# Patient Record
Sex: Female | Born: 1945
Health system: Southern US, Community
[De-identification: ages and names within clinical notes are randomized; demographics above are authoritative.]

## PROBLEM LIST (undated history)

## (undated) DIAGNOSIS — K2980 Duodenitis without bleeding: Secondary | ICD-10-CM

## (undated) DIAGNOSIS — R079 Chest pain, unspecified: Secondary | ICD-10-CM

## (undated) DIAGNOSIS — I10 Essential (primary) hypertension: Secondary | ICD-10-CM

## (undated) DIAGNOSIS — E785 Hyperlipidemia, unspecified: Secondary | ICD-10-CM

## (undated) DIAGNOSIS — B9681 Helicobacter pylori [H. pylori] as the cause of diseases classified elsewhere: Secondary | ICD-10-CM

## (undated) DIAGNOSIS — Z9889 Other specified postprocedural states: Secondary | ICD-10-CM

## (undated) HISTORY — DX: Helicobacter pylori (H. pylori) as the cause of diseases classified elsewhere: K29.80

## (undated) HISTORY — PX: TUBAL LIGATION: SHX77

## (undated) HISTORY — DX: Other specified postprocedural states: Z98.890

## (undated) HISTORY — DX: Essential (primary) hypertension: I10

## (undated) HISTORY — DX: Chest pain, unspecified: R07.9

## (undated) HISTORY — PX: HERNIA REPAIR: SHX51

## (undated) HISTORY — DX: Helicobacter pylori (H. pylori) as the cause of diseases classified elsewhere: B96.81

## (undated) HISTORY — DX: Hyperlipidemia, unspecified: E78.5

---

## 2001-06-17 ENCOUNTER — Other Ambulatory Visit: Admission: RE | Admit: 2001-06-17 | Discharge: 2001-06-17 | Payer: Self-pay | Admitting: Family Medicine

## 2001-11-12 HISTORY — PX: COLONOSCOPY: SHX174

## 2005-09-29 HISTORY — PX: BREAST BIOPSY: SHX20

## 2005-09-29 HISTORY — PX: ABDOMINAL HYSTERECTOMY: SHX81

## 2005-10-22 ENCOUNTER — Ambulatory Visit: Payer: Self-pay | Admitting: Family Medicine

## 2005-12-11 DIAGNOSIS — Z9889 Other specified postprocedural states: Secondary | ICD-10-CM

## 2005-12-11 HISTORY — DX: Other specified postprocedural states: Z98.890

## 2006-02-02 ENCOUNTER — Ambulatory Visit: Payer: Self-pay | Admitting: Gynecology

## 2006-03-31 ENCOUNTER — Ambulatory Visit: Payer: Self-pay | Admitting: Gynecology

## 2006-03-31 ENCOUNTER — Ambulatory Visit (HOSPITAL_COMMUNITY): Admission: RE | Admit: 2006-03-31 | Discharge: 2006-04-01 | Payer: Self-pay | Admitting: Gynecology

## 2006-03-31 ENCOUNTER — Encounter (INDEPENDENT_AMBULATORY_CARE_PROVIDER_SITE_OTHER): Payer: Self-pay | Admitting: *Deleted

## 2006-04-27 ENCOUNTER — Ambulatory Visit: Payer: Self-pay | Admitting: Gynecology

## 2006-06-08 ENCOUNTER — Ambulatory Visit: Payer: Self-pay | Admitting: General Surgery

## 2006-12-29 ENCOUNTER — Ambulatory Visit: Payer: Self-pay | Admitting: Family Medicine

## 2007-12-06 ENCOUNTER — Encounter: Payer: Self-pay | Admitting: Cardiology

## 2007-12-08 ENCOUNTER — Ambulatory Visit: Payer: Self-pay | Admitting: Family Medicine

## 2007-12-15 ENCOUNTER — Ambulatory Visit: Payer: Self-pay | Admitting: Cardiology

## 2007-12-20 ENCOUNTER — Ambulatory Visit: Payer: Self-pay | Admitting: Cardiology

## 2007-12-29 ENCOUNTER — Ambulatory Visit: Payer: Self-pay | Admitting: Cardiology

## 2008-12-12 ENCOUNTER — Ambulatory Visit: Payer: Self-pay | Admitting: Family Medicine

## 2009-03-22 DIAGNOSIS — R079 Chest pain, unspecified: Secondary | ICD-10-CM

## 2009-12-17 ENCOUNTER — Ambulatory Visit: Payer: Self-pay | Admitting: Family Medicine

## 2010-01-14 ENCOUNTER — Ambulatory Visit: Payer: Self-pay | Admitting: Family Medicine

## 2011-01-16 ENCOUNTER — Ambulatory Visit: Payer: Self-pay | Admitting: Family Medicine

## 2011-02-11 NOTE — Assessment & Plan Note (Signed)
[facility] OFFICE NOTE   NAME:[name] [name] CALABRIA                         MRN:          [id number]  DATE:12/15/2007                            DOB:          Nov 29, 1945    REFERRING PHYSICIAN:  Lorie Phenix, MD.   I was asked to consult on [name] [name] by Dr. Lorie Phenix for chest  discomfort.   HISTORY OF PRESENT ILLNESS:  [name] [name] is a delightful 65 year old  married lady who over the last year has had chest tightness going up  into her neck.  This is usually not associated with exertion but can  happen most any time.  She has had some mild dyspnea on exertion,  however.   She denies any presyncope, syncope, tachy palpitations.  She has had no  GI upper symptomatology.  It does not radiate into her arms.   She seems to be very proactive with her health.  Her cardiac risk  factors include age, sex, family history, hypertension and  hyperlipidemia.   She seems very compliant.  Her husband had a heart attack several years  ago and they are very careful with their diets.   PAST MEDICAL HISTORY:  She is not allergic to any medications.  She does  not smoke.  She does not use any recreational products.  She does not  drink alcohol.  She enjoys walking but I am not sure she is doing 3  hours a week or more.   SURGICAL HISTORY:  She had a hysterectomy in July of 2007, breast biopsy  in March of 2008.   CURRENT MEDS:  Atenolol 25 mg a day, Lipitor 20 mg a day, aspirin 81 mg  a day.   ALLERGIES:  She lists no allergies.   FAMILY HISTORY:  Her brothers had coronary disease.  Her parents both  had heart disease but they were older.   SOCIAL HISTORY:  She is a Scientist, product/process development.  She walks around and keeps a  machine running all day.  She is married and has 5 children.   REVIEW OF SYSTEMS:  She has a history of seasonal allergies but no  history of asthma.  All of the other review of systems other than the  HPI have been carefully reviewed and are negative.   PHYSICAL EXAMINATION:  On exam her blood pressure today was 144/84.  Her  pulse is 60 and regular.  She is 5 feet 7 inches, weighs 190 pounds.  HEENT:  Normocephalic, atraumatic.  PERRLA.  Extraocular movements  intact.  Sclerae are clear.  Face symmetry normal.  Carotid upstrokes  equal bilaterally without obvious bruit.  Thyroid is not enlarged.  Trachea is midline.  Neck is supple.  She has a scar over her lower chin  area.  LUNGS:  Clear to auscultation and percussion.  HEART:  PMI was hard to appreciate.  She has no chest wall tenderness.  She has normal S1, S2 without murmur or gallop.  ABDOMINAL EXAM:  Soft with good bowel sounds.  There is no midline  bruit.  There is no obvious hepatomegaly or organomegaly.  EXTREMITIES:  Reveal no significant edema.  Pulses were present.  NEURO EXAM:  Intact.  SKIN:  Unremarkable.   EKG from the outside was reviewed, which shows normal sinus rhythm with  1 PVC.  She does not have criteria for LVH.  There are no ST-segment  changes.   LABORATORY:  Remarkable for a total cholesterol 216, LDL 138, however,  HDL 62 with a total cholesterol to HDL ratio of less than 4.  Her  fasting blood sugar was 87.   ASSESSMENT/PLAN:  A long talk, greater than 30 minutes, with [name] [name]  today.  We have covered the following subjects:  1. The importance of preventative therapy including controlling her      blood pressure, continuing her statin, and aspirin.  2. The need to rule out any significant obstructive coronary disease      with a stress test Myoview.  This will also give [place] a prescription      for exercise.  3. The importance of walking at least 3 hours a week or some sort of      cardio activity.  We also talked about weight control, the risk of      diabetes and hypertension worsening.   I have made no changes in her medical therapy.  Dr. Elease Hashimoto is doing an  excellent job with this.  We  will set her up for a diagnostic exercise  rest stress Myoview.  I will have her come back to answer any questions  and to prescribe an exercise plan.     Thomas C. Daleen Squibb, MD, [facility]  Electronically Signed    TCW/MedQ  DD: 12/15/2007  DT: 12/15/2007  Job #: [id number]

## 2011-02-11 NOTE — Assessment & Plan Note (Signed)
[facility] OFFICE NOTE   NAME:[name], [name] ANTONOPOULOS                         MRN:          [id number]  DATE:12/29/2007                            DOB:          19-Feb-1946    [name] returns today for follow-up of her chest discomfort.   Her stress Myoview was done on March 23 at [facility].  She held her atenolol which is 25 mg once a day.   She exercised on Bruce protocol for only 4 minutes in 16 seconds.  MET  level achieved was 6.1.  Her heart rate jumped up to 176 which is 111%  of predicted maximum heart rate.  Her blood pressure increased  traumatically to 231/113.  She some frequent PVCs and occasional  couplet.  There was no chest pain.  Test stopped secondary to her  hypertension response and over achieving her maximum heart rate.  There  were no ST-segment changes.   Her Myoview images showed an EF of 72% with normal wall motion  throughout.  There was some breast attenuation anteriorly.   I had a long talk with [name] today about her studies.  I have  increased her atenolol to 25 mg p.o. b.i.d. since this is a better  q.12h. drug.  In addition, I have added amlodipine 5 mg a day for  exaggerated blood pressure response to exercise, not to mention baseline  hypertension which is still up today.  Her blood pressure specifically  today is 166/102.   She will start walking on a gradual basis.  Her goal is to get to 3  hours per week.  I will see her back in the near future to see how her  blood pressure is doing and how she is coming with her exercise program.  She is on an excellent program otherwise with Dr. Elease Hashimoto.     Thomas C. Daleen Squibb, MD, [facility]  Electronically Signed    TCW/MedQ  DD: 12/29/2007  DT: 12/29/2007  Job #: [id number]   cc:   Lorie Phenix

## 2011-02-14 NOTE — Op Note (Signed)
[name], [name] [name]                  ACCOUNT NO.:  [id number]   MEDICAL RECORD NO.:  [id number]          PATIENT TYPE:  OIB   LOCATION:  9310                          FACILITY:  [facility]   PHYSICIAN:  Ginger Carne, MD  DATE OF BIRTH:  Dec 25, 1945   DATE OF PROCEDURE:  03/31/2006  DATE OF DISCHARGE:                                 OPERATIVE REPORT   PREOPERATIVE DIAGNOSES:  1.  Postmenopausal bleeding.  2.  An 18-week leiomyomatous uterus.   POSTOP DIAGNOSES:  1.  Postmenopausal bleeding.  2.  An 18-week leiomyomatous uterus.   PROCEDURES:  1.  Total vaginal hysterectomy.  2.  Bilateral salpingo-oophorectomy.   SURGEON:  Ginger Carne, MD   ASSISTANT:  Dr. Okey Dupre.   ESTIMATED BLOOD LOSS:  100 mL.   SPECIMEN:  Uterus, cervix, right and left tubes and ovaries.   ANESTHESIA:  General.   COMPLICATIONS:  None immediate.   OPERATIVE FINDINGS:  Uterus was 18 weeks in size, multiple leiomyoma,  weighing approximately 460 grams.  Both tubes and ovaries demonstrated  atrophy.  All specimens listed above were sent to pathology.  External  genitalia, vulva, and vagina normal.  The patient had a second-degree  asymptomatic cystocele.   OPERATIVE PROCEDURE:  The patient was prepped and draped in the usual  fashion and placed in the lithotomy position.  Betadine solution used for  antiseptic and the patient was catheterized prior to the procedure.  After  adequate general anesthesia a double toothed tenaculum was placed on the  anterior and posterior lips of the cervix.  Then 1/2% Marcaine with  1:200,000 epinephrine was used for a paracervical block.  Then 2 cm of  anterior and posterior vaginal epithelium were incised transversely.  The  peritoneal reflections were identified and opened without injury to their  respective organs.  Uterosacral cardinal ligament complexes were clamped,  cut, and ligated and affixed to the respective vaginal walls with #0 Vicryl  suture.  The uterine  vasculature with its ascending branches were clamped,  cut, and ligated in the standard Richardson fashion with #0 Vicryl suture.  This extended to the broad ligaments.  Coring and wedging was performed.  Utero-ovarian ligaments on the other side were bilaterally clamped, cut, and  ligated.   This then extended to the infundibulopelvic ligaments bilaterally  incorporating tubes and ovaries.  Transfixation with #0 Vicryl sutures were  used twice.  No active bleeding noted.  Irrigation performed, closure of the  cuff in 1 layer with #0 Vicryl running, interlocking suture.  The patient  tolerated the procedure well, and returned to the post anesthesia recovery  room in excellent condition.      Ginger Carne, MD  Electronically Signed     SHB/MEDQ  D:  03/31/2006  T:  03/31/2006  Job:  [id number]

## 2011-02-14 NOTE — Discharge Summary (Signed)
NAMEJANELE, LAGUE                  ACCOUNT NO.:  [id number]   MEDICAL RECORD NO.:  [id number]          PATIENT TYPE:  OIB   LOCATION:  9310                          FACILITY:  [facility]   PHYSICIAN:  Lesly Dukes, M.D. DATE OF BIRTH:  1946/03/28   DATE OF ADMISSION:  03/31/2006  DATE OF DISCHARGE:  04/01/2006                                 DISCHARGE SUMMARY   Patient is a 65 year old female who had a large myomatous uterus who  underwent transvaginal hysterectomy with Dr. Blima Rich and Dr. Argentina Donovan on March 31, 2006.  Details of that operation are in the operative  report.  The patient had 100 mL blood loss; however, the uterus was very  large and there was probably more blood that was in the uterus.  The patient  did well postoperatively.  She remained afebrile.  Her abdomen nontender.  She is ambulating, tolerating oral food, and passing gas.  Her discharge  hemoglobin is 11.7.   DISCHARGE MEDICATIONS:  Vicodin, Premarin, and any preoperative medications  she came in with.  Prescriptions were given for the Vicodin and Premarin.   DISCHARGE INSTRUCTIONS:  No sex for six weeks.  May walk up steps.  May  shower and bathe.  Patient is not to lift over 20 pounds for six weeks.   DIET RESTRICTIONS:  None.   WOUND CARE:  Not applicable.   Postoperative appointment is one month at the [facility].  Patient  is to call for an appointment.           ______________________________  Lesly Dukes, M.D.     KHL/MEDQ  D:  04/01/2006  T:  04/01/2006  Job:  [id number]

## 2011-03-21 ENCOUNTER — Encounter: Payer: Self-pay | Admitting: Cardiovascular Disease

## 2011-07-18 ENCOUNTER — Emergency Department: Payer: Self-pay | Admitting: Emergency Medicine

## 2012-01-22 ENCOUNTER — Ambulatory Visit: Payer: Self-pay | Admitting: Family Medicine

## 2013-01-17 ENCOUNTER — Encounter: Payer: Self-pay | Admitting: *Deleted

## 2013-01-25 ENCOUNTER — Ambulatory Visit: Payer: Self-pay | Admitting: Family Medicine

## 2013-02-10 ENCOUNTER — Ambulatory Visit: Payer: Self-pay | Admitting: General Surgery

## 2013-03-14 ENCOUNTER — Ambulatory Visit: Payer: Self-pay | Admitting: General Surgery

## 2013-03-18 ENCOUNTER — Encounter: Payer: Self-pay | Admitting: General Surgery

## 2013-03-28 ENCOUNTER — Ambulatory Visit (INDEPENDENT_AMBULATORY_CARE_PROVIDER_SITE_OTHER): Payer: Medicare Other | Admitting: General Surgery

## 2013-03-28 ENCOUNTER — Encounter: Payer: Self-pay | Admitting: General Surgery

## 2013-03-28 VITALS — BP 120/80 | HR 80 | Resp 14 | Ht 67.0 in | Wt 195.0 lb

## 2013-03-28 DIAGNOSIS — Z1211 Encounter for screening for malignant neoplasm of colon: Secondary | ICD-10-CM

## 2013-03-28 MED ORDER — POLYETHYLENE GLYCOL 3350 17 GM/SCOOP PO POWD: ORAL | Status: DC

## 2013-03-28 NOTE — Patient Instructions (Addendum)
Colonoscopy A colonoscopy is an exam to evaluate your entire colon. In this exam, your colon is cleansed. A long fiberoptic tube is inserted through your rectum and into your colon. The fiberoptic scope (endoscope) is a long bundle of enclosed and very flexible fibers. These fibers transmit light to the area examined and send images from that area to your caregiver. Discomfort is usually minimal. You may be given a drug to help you sleep (sedative) during or prior to the procedure. This exam helps to detect lumps (tumors), polyps, inflammation, and areas of bleeding. Your caregiver may also take a small piece of tissue (biopsy) that will be examined under a microscope. LET YOUR CAREGIVER KNOW ABOUT:   Allergies to food or medicine.  Medicines taken, including vitamins, herbs, eyedrops, over-the-counter medicines, and creams.  Use of steroids (by mouth or creams).  Previous problems with anesthetics or numbing medicines.  History of bleeding problems or blood clots.  Previous surgery.  Other health problems, including diabetes and kidney problems.  Possibility of pregnancy, if this applies. BEFORE THE PROCEDURE   A clear liquid diet may be required for 2 days before the exam.  Ask your caregiver about changing or stopping your regular medications.  Liquid injections (enemas) or laxatives may be required.  A large amount of electrolyte solution may be given to you to drink over a short period of time. This solution is used to clean out your colon.  You should be present 60 minutes prior to your procedure or as directed by your caregiver. AFTER THE PROCEDURE   If you received a sedative or pain relieving medication, you will need to arrange for someone to drive you home.  Occasionally, there is a little blood passed with the first bowel movement. Do not be concerned. FINDING OUT THE RESULTS OF YOUR TEST Not all test results are available during your visit. If your test results are  not back during the visit, make an appointment with your caregiver to find out the results. Do not assume everything is normal if you have not heard from your caregiver or the medical facility. It is important for you to follow up on all of your test results. HOME CARE INSTRUCTIONS   It is not unusual to pass moderate amounts of gas and experience mild abdominal cramping following the procedure. This is due to air being used to inflate your colon during the exam. Walking or a warm pack on your belly (abdomen) may help.  You may resume all normal meals and activities after sedatives and medicines have worn off.  Only take over-the-counter or prescription medicines for pain, discomfort, or fever as directed by your caregiver. Do not use aspirin or blood thinners if a biopsy was taken. Consult your caregiver for medicine usage if biopsies were taken. SEEK IMMEDIATE MEDICAL CARE IF:   You have a fever.  You pass large blood clots or fill a toilet with blood following the procedure. This may also occur 10 to 14 days following the procedure. This is more likely if a biopsy was taken.  You develop abdominal pain that keeps getting worse and cannot be relieved with medicine. Document Released: 09/12/2000 Document Revised: 12/08/2011 Document Reviewed: 04/27/2008 [facility] Patient Information 2014 [place], [place].  Patient has been scheduled for a colonoscopy on 05-18-13 at [facility]. This patient has been asked to discontinue fish oil one week prior to procedure.

## 2013-03-28 NOTE — Progress Notes (Signed)
Patient ID: LATYRA JAYE, female   DOB: Jul 17, 1946, 67 y.o.   MRN: [id number]  Chief Complaint  Patient presents with  . Colonoscopy    HPI MARNETTE PERKINS is a 67 y.o. female here today for an evaluation of an colonoscopy.Patient had an colonoscopy back in 2003. The patient has a personal and family history of colon polyps. She denies any problems with the bowels at this time.   HPI  Past Medical History  Diagnosis Date  . HLD (hyperlipidemia)     mixed  . HTN (hypertension)   . Chest pain, unspecified   . H/O left breast biopsy 12/11/2005    [facility], path report matched the clinical impression of left breast lipoma    Past Surgical History  Procedure Laterality Date  . Abdominal hysterectomy  2007    total  . Colonoscopy  11/12/2001    multiple sessile polyps found in rectum, 5mm in size, distributed diffusely, biopsies were taken. The polyps extended from lower rectum to approximately 20 cm. They were confluent in the area between 10-15cm. Small polyps with central ulceration were noted.  . Breast biopsy  2008    [facility] bx done 12/11/2005    Family History  Problem Relation Age of Onset  . Cancer      family hx  . Coronary artery disease      family hx  . Cancer Other     ovarian, breast, colon cancers    Social History History  Substance Use Topics  . Smoking status: Never Smoker   . Smokeless tobacco: Never Used     Comment: tobacco use - no  . Alcohol Use: No    No Known Allergies  Current Outpatient Prescriptions  Medication Sig Dispense Refill  . amLODipine (NORVASC) 5 MG tablet Take 1 tablet by mouth daily.      Marland Kitchen aspirin 81 MG tablet Take 81 mg by mouth daily.      Marland Kitchen atenolol (TENORMIN) 25 MG tablet Take 25 mg by mouth 2 (two) times daily.        Marland Kitchen atorvastatin (LIPITOR) 20 MG tablet Take 1 tablet by mouth daily.      . meloxicam (MOBIC) 15 MG tablet Take 1 tablet by mouth daily.      . polyethylene glycol powder (GLYCOLAX/MIRALAX) powder 255 grams one bottle  for colonoscopy prep  255 g  0   No current facility-administered medications for this visit.    Review of Systems Review of Systems  Constitutional: Negative.   Respiratory: Negative.   Cardiovascular: Negative.   Gastrointestinal: Negative.     Blood pressure 120/80, pulse 80, resp. rate 14, height 5\' 7"  (1.702 m), weight 195 lb (88.451 kg).  Physical Exam Physical Exam  Constitutional: She is oriented to person, place, and time. She appears well-developed and well-nourished.  Neck: Neck supple.  Cardiovascular: Normal rate, regular rhythm and normal heart sounds.   Pulmonary/Chest: Effort normal and breath sounds normal.  Neurological: She is alert and oriented to person, place, and time.    Data Reviewed Biopsies obtained from the rectum in February 2003 showed mild active colitis with focal cryptitis, superficial epithelial necrosis and reactive lymphoid follicles. Multiple sessile polyps were identified in the rectum during this exam. They extended from the lower rectum to 20 cm, confluent in the 10-15 cm area. The patient's 2003 exam was prompted by an episode of rectal bleeding. She denies any further trouble with rectal bleeding.  Primary care office notes dated December 22, 2012 were reviewed.  Laboratory studies submitted with the patient primary care note but not specifically labeled with name ward date show normal hemoglobin 13.2, MCV of 93, normal comprehensive metabolic panel with a creatinine of 0.68 negative urine dipstick.  Assessment    Previous focal inflammatory polyps of the rectum.     Plan    Screening colonoscopy is scheduled for May 18, 2013. The procedure including the risk and benefits were reviewed in detail.       Patient has been scheduled for a colonoscopy on 05-18-13 at [facility]. This patient has been asked to discontinue fish oil one week prior to procedure.   Earline Mayotte 03/28/2013, 8:08 PM

## 2013-05-12 ENCOUNTER — Telehealth: Payer: Self-pay | Admitting: *Deleted

## 2013-05-12 NOTE — Telephone Encounter (Signed)
Patient reports no medication changes since last office visit. We will proceed with colonoscopy that is scheduled at [facility] for 05-18-13. She will call the office if she has further questions.

## 2013-05-16 ENCOUNTER — Other Ambulatory Visit: Payer: Self-pay | Admitting: General Surgery

## 2013-05-16 DIAGNOSIS — Z1211 Encounter for screening for malignant neoplasm of colon: Secondary | ICD-10-CM

## 2013-05-18 ENCOUNTER — Ambulatory Visit: Payer: Self-pay | Admitting: General Surgery

## 2013-05-18 DIAGNOSIS — D129 Benign neoplasm of anus and anal canal: Secondary | ICD-10-CM

## 2013-05-18 DIAGNOSIS — D128 Benign neoplasm of rectum: Secondary | ICD-10-CM

## 2013-05-18 LAB — HM COLONOSCOPY

## 2013-05-20 ENCOUNTER — Telehealth: Payer: Self-pay | Admitting: General Surgery

## 2013-05-20 ENCOUNTER — Encounter: Payer: Self-pay | Admitting: General Surgery

## 2013-05-20 NOTE — Telephone Encounter (Signed)
x

## 2013-05-27 ENCOUNTER — Encounter: Payer: Self-pay | Admitting: General Surgery

## 2013-06-17 ENCOUNTER — Encounter: Payer: Self-pay | Admitting: General Surgery

## 2013-12-23 ENCOUNTER — Ambulatory Visit: Payer: Self-pay | Admitting: Family Medicine

## 2013-12-28 ENCOUNTER — Ambulatory Visit: Payer: Self-pay | Admitting: Family Medicine

## 2014-01-26 ENCOUNTER — Ambulatory Visit: Payer: Self-pay | Admitting: Family Medicine

## 2014-02-06 ENCOUNTER — Encounter: Payer: Self-pay | Admitting: Cardiovascular Disease

## 2014-02-06 ENCOUNTER — Encounter (INDEPENDENT_AMBULATORY_CARE_PROVIDER_SITE_OTHER): Payer: Self-pay

## 2014-02-06 ENCOUNTER — Ambulatory Visit (INDEPENDENT_AMBULATORY_CARE_PROVIDER_SITE_OTHER): Payer: Commercial Managed Care - HMO | Admitting: Cardiovascular Disease

## 2014-02-06 VITALS — BP 138/90 | HR 70 | Ht 67.0 in | Wt 197.5 lb

## 2014-02-06 DIAGNOSIS — I493 Ventricular premature depolarization: Secondary | ICD-10-CM

## 2014-02-06 DIAGNOSIS — E785 Hyperlipidemia, unspecified: Secondary | ICD-10-CM

## 2014-02-06 DIAGNOSIS — I4949 Other premature depolarization: Secondary | ICD-10-CM

## 2014-02-06 DIAGNOSIS — M542 Cervicalgia: Secondary | ICD-10-CM

## 2014-02-06 DIAGNOSIS — R9431 Abnormal electrocardiogram [ECG] [EKG]: Secondary | ICD-10-CM

## 2014-02-06 DIAGNOSIS — I499 Cardiac arrhythmia, unspecified: Secondary | ICD-10-CM

## 2014-02-06 DIAGNOSIS — M25476 Effusion, unspecified foot: Secondary | ICD-10-CM

## 2014-02-06 DIAGNOSIS — I10 Essential (primary) hypertension: Secondary | ICD-10-CM

## 2014-02-06 DIAGNOSIS — M25473 Effusion, unspecified ankle: Secondary | ICD-10-CM | POA: Insufficient documentation

## 2014-02-06 NOTE — Assessment & Plan Note (Signed)
Recommended she stay on her Lipitor. She starting a regular exercise program for weight loss.

## 2014-02-06 NOTE — Patient Instructions (Signed)
You are doing well. No medication changes were made.  Call the office if you would like a holter monitor for 48 hours  Call for worsening leg/ankle swelling Try the compression hose from [employer]  Please call us if you have new issues that need to be addressed before your next appt.

## 2014-02-06 NOTE — Progress Notes (Signed)
Patient ID: [name] [name], female    DOB: May 07, 1946, 68 y.o.   MRN: [id number]  HPI Comments: [name] [name] is a very pleasant 68 year old woman, patient of Dr. Venia Minks who presents by referral for evaluation of abnormal EKG, chest x-ray showed mildly enlarged cardiac silhouette (not available for review).   She is concerned about various issues that she's had over the past several weeks. She had a friend with carotid arterial disease requiring intervention. She herself has had pain in the left side of her neck and is concerned about blockage.   Carotid ultrasound was done 12/28/2013 showing no significant carotid arterial disease  She does report having ankle swelling in one of her legs. Otherwise no swelling. This happens periodically when she is on her feet for prolonged periods of time. Denies having any shortness of breath, PND, orthopnea. Otherwise she is active with no complaints. She does not do regular exercise program. Prior EKG showing PVCs. She is asymptomatic  Lab work March 2015 showing total cholesterol 191, LDL 96, HDL 59 TSH 0.97  EKG from [facility] family practice shows normal sinus rhythm with rate 71 beats per minute, PVCs in a trigeminal pattern EKG in our office showing normal sinus rhythm with rate 70 beats per minute, PVCs. 1 EKG showing PVCs in a bigeminal pattern, otherwise normal EKG    Outpatient Encounter Prescriptions as of 02/06/2014  Medication Sig  . amLODipine (NORVASC) 5 MG tablet Take 1 tablet by mouth daily.  Marland Kitchen aspirin 81 MG tablet Take 81 mg by mouth daily.  Marland Kitchen atenolol (TENORMIN) 25 MG tablet Take 25 mg by mouth 2 (two) times daily.    Marland Kitchen atorvastatin (LIPITOR) 20 MG tablet Take 1 tablet by mouth daily.  . fluticasone (FLONASE) 50 MCG/ACT nasal spray Place 2 sprays into both nostrils daily.  . meloxicam (MOBIC) 15 MG tablet Take 1 tablet by mouth daily.  . polyethylene glycol powder (GLYCOLAX/MIRALAX) powder 255 grams one bottle for colonoscopy prep  .  ranitidine (ZANTAC) 150 MG capsule Take 150 mg by mouth 2 (two) times daily.     Review of Systems  Constitutional: Negative.   HENT: Negative.   Eyes: Negative.   Respiratory: Negative.   Cardiovascular: Positive for leg swelling.  Gastrointestinal: Negative.   Endocrine: Negative.   Musculoskeletal: Positive for neck pain.  Skin: Negative.   Allergic/Immunologic: Negative.   Neurological: Negative.   Hematological: Negative.   Psychiatric/Behavioral: Negative.   All other systems reviewed and are negative.   BP 138/90  Pulse 70  Ht 5\' 7"  (1.702 m)  Wt 197 lb 8 oz (89.585 kg)  BMI 30.93 kg/m2  Physical Exam  Nursing note and vitals reviewed. Constitutional: She is oriented to person, place, and time. She appears well-developed and well-nourished.  HENT:  Head: Normocephalic.  Nose: Nose normal.  Mouth/Throat: Oropharynx is clear and moist.  Eyes: Conjunctivae are normal. Pupils are equal, round, and reactive to light.  Neck: Normal range of motion. Neck supple. No JVD present.  Cardiovascular: Normal rate, regular rhythm, S1 normal, S2 normal, normal heart sounds and intact distal pulses.  Exam reveals no gallop and no friction rub.   No murmur heard. Pulmonary/Chest: Effort normal and breath sounds normal. No respiratory distress. She has no wheezes. She has no rales. She exhibits no tenderness.  Abdominal: Soft. Bowel sounds are normal. She exhibits no distension. There is no tenderness.  Musculoskeletal: Normal range of motion. She exhibits no edema and no tenderness.  Lymphadenopathy:  She has no cervical adenopathy.  Neurological: She is alert and oriented to person, place, and time. Coordination normal.  Skin: Skin is warm and dry. No rash noted. No erythema.  Psychiatric: She has a normal mood and affect. Her behavior is normal. Judgment and thought content normal.    Assessment and Plan

## 2014-02-06 NOTE — Assessment & Plan Note (Signed)
Atypical neck pain. Recent carotid ultrasound showing no carotid arterial disease. Explain to her that symptoms are likely musculoskeletal. Carotid ultrasound results reviewed with her

## 2014-02-06 NOTE — Assessment & Plan Note (Addendum)
She has asymptomatic PVCs. Clinical exam is essentially benign. EKG without PVCs is essentially normal. As she has her symptoms, we have recommended she stay on her atenolol. If she does become symptomatic, Holter monitor could be performed and antiarrhythmic medication could be initiated such as flecainide. There is no suggestion of structural heart disease or ischemia given she has no symptoms and essentially benign clinical exam. She is happy to have this monitored for now.

## 2014-02-06 NOTE — Assessment & Plan Note (Signed)
Unilateral ankle swelling. Not notable today. I suspect this could be secondary to mild venous insufficiency as it seems to occur when she is on her feet for long periods of time. Possibly exacerbated by amlodipine. Symptoms seem mild at this time. We have recommended leg elevation and compression hose when necessary

## 2014-02-06 NOTE — Assessment & Plan Note (Signed)
Blood pressure is well controlled on today's visit. No changes made to the medications. 

## 2014-07-31 ENCOUNTER — Encounter: Payer: Self-pay | Admitting: Cardiovascular Disease

## 2014-12-03 ENCOUNTER — Emergency Department: Payer: Medicare PPO | Admitting: Emergency Medicine

## 2014-12-03 DIAGNOSIS — R109 Unspecified abdominal pain: Secondary | ICD-10-CM | POA: Diagnosis not present

## 2014-12-03 DIAGNOSIS — Z90710 Acquired absence of both cervix and uterus: Secondary | ICD-10-CM | POA: Diagnosis not present

## 2014-12-03 DIAGNOSIS — K8020 Calculus of gallbladder without cholecystitis without obstruction: Secondary | ICD-10-CM | POA: Diagnosis not present

## 2014-12-03 DIAGNOSIS — N23 Unspecified renal colic: Secondary | ICD-10-CM | POA: Diagnosis not present

## 2014-12-03 DIAGNOSIS — I10 Essential (primary) hypertension: Secondary | ICD-10-CM | POA: Diagnosis not present

## 2014-12-03 DIAGNOSIS — N201 Calculus of ureter: Secondary | ICD-10-CM | POA: Diagnosis not present

## 2014-12-04 DIAGNOSIS — N1330 Unspecified hydronephrosis: Secondary | ICD-10-CM | POA: Diagnosis not present

## 2014-12-04 DIAGNOSIS — K8020 Calculus of gallbladder without cholecystitis without obstruction: Secondary | ICD-10-CM | POA: Diagnosis not present

## 2014-12-04 DIAGNOSIS — I10 Essential (primary) hypertension: Secondary | ICD-10-CM | POA: Diagnosis not present

## 2014-12-04 DIAGNOSIS — Z90710 Acquired absence of both cervix and uterus: Secondary | ICD-10-CM | POA: Diagnosis not present

## 2014-12-04 DIAGNOSIS — N134 Hydroureter: Secondary | ICD-10-CM | POA: Diagnosis not present

## 2014-12-04 DIAGNOSIS — N201 Calculus of ureter: Secondary | ICD-10-CM | POA: Diagnosis not present

## 2014-12-04 DIAGNOSIS — N23 Unspecified renal colic: Secondary | ICD-10-CM | POA: Diagnosis not present

## 2014-12-27 DIAGNOSIS — Z0000 Encounter for general adult medical examination without abnormal findings: Secondary | ICD-10-CM | POA: Diagnosis not present

## 2015-01-12 ENCOUNTER — Other Ambulatory Visit: Payer: Self-pay | Admitting: Family Medicine

## 2015-01-12 DIAGNOSIS — N1330 Unspecified hydronephrosis: Secondary | ICD-10-CM

## 2015-01-12 DIAGNOSIS — Z13820 Encounter for screening for osteoporosis: Secondary | ICD-10-CM

## 2015-01-12 DIAGNOSIS — Z139 Encounter for screening, unspecified: Secondary | ICD-10-CM

## 2015-01-22 DIAGNOSIS — E0789 Other specified disorders of thyroid: Secondary | ICD-10-CM | POA: Diagnosis not present

## 2015-01-22 DIAGNOSIS — E780 Pure hypercholesterolemia: Secondary | ICD-10-CM | POA: Diagnosis not present

## 2015-01-22 DIAGNOSIS — I10 Essential (primary) hypertension: Secondary | ICD-10-CM | POA: Diagnosis not present

## 2015-01-29 ENCOUNTER — Other Ambulatory Visit: Payer: Self-pay | Admitting: Family Medicine

## 2015-01-29 ENCOUNTER — Ambulatory Visit
Admission: RE | Admit: 2015-01-29 | Discharge: 2015-01-29 | Disposition: A | Discharge status: Home / Self Care | Payer: Commercial Managed Care - HMO | Source: Ambulatory Visit | Attending: Family Medicine | Admitting: Family Medicine

## 2015-01-29 DIAGNOSIS — Z139 Encounter for screening, unspecified: Secondary | ICD-10-CM

## 2015-01-29 DIAGNOSIS — Z780 Asymptomatic menopausal state: Secondary | ICD-10-CM | POA: Diagnosis not present

## 2015-01-29 DIAGNOSIS — Z1231 Encounter for screening mammogram for malignant neoplasm of breast: Secondary | ICD-10-CM | POA: Insufficient documentation

## 2015-01-29 DIAGNOSIS — M8588 Other specified disorders of bone density and structure, other site: Secondary | ICD-10-CM | POA: Diagnosis not present

## 2015-01-29 DIAGNOSIS — Z13820 Encounter for screening for osteoporosis: Secondary | ICD-10-CM

## 2015-01-31 ENCOUNTER — Ambulatory Visit
Admission: RE | Admit: 2015-01-31 | Discharge: 2015-01-31 | Disposition: A | Discharge status: Home / Self Care | Payer: Commercial Managed Care - HMO | Source: Ambulatory Visit | Attending: Family Medicine | Admitting: Family Medicine

## 2015-01-31 DIAGNOSIS — N1330 Unspecified hydronephrosis: Secondary | ICD-10-CM | POA: Insufficient documentation

## 2015-06-28 ENCOUNTER — Telehealth: Payer: Self-pay | Admitting: Family Medicine

## 2015-06-28 NOTE — Telephone Encounter (Signed)
[name] [name] is needing a written order faxed on pt for Renal back in May 4th 2016. CB# [phone] Fax# [phone]. CC

## 2015-06-29 NOTE — Telephone Encounter (Signed)
Printed renal US from [employer] placed on 12/29/2014 and Faxed to [place] as below. Renaldo Fiddler, CMA

## 2015-07-18 DIAGNOSIS — H524 Presbyopia: Secondary | ICD-10-CM | POA: Diagnosis not present

## 2015-07-18 DIAGNOSIS — H5210 Myopia, unspecified eye: Secondary | ICD-10-CM | POA: Diagnosis not present

## 2015-12-28 ENCOUNTER — Encounter: Payer: Self-pay | Admitting: Family Medicine

## 2016-01-25 ENCOUNTER — Other Ambulatory Visit: Payer: Self-pay | Admitting: Family Medicine

## 2016-01-30 ENCOUNTER — Telehealth: Payer: Self-pay | Admitting: Family Medicine

## 2016-02-20 ENCOUNTER — Encounter: Payer: Self-pay | Admitting: Family Medicine

## 2016-02-20 ENCOUNTER — Other Ambulatory Visit: Payer: Self-pay

## 2016-02-20 ENCOUNTER — Ambulatory Visit (INDEPENDENT_AMBULATORY_CARE_PROVIDER_SITE_OTHER): Payer: Commercial Managed Care - HMO | Admitting: Family Medicine

## 2016-02-20 VITALS — BP 108/56 | HR 68 | Temp 98.2°F | Resp 16 | Ht 66.0 in | Wt 195.0 lb

## 2016-02-20 DIAGNOSIS — E7800 Pure hypercholesterolemia, unspecified: Secondary | ICD-10-CM | POA: Diagnosis not present

## 2016-02-20 DIAGNOSIS — I10 Essential (primary) hypertension: Secondary | ICD-10-CM | POA: Diagnosis not present

## 2016-02-20 DIAGNOSIS — D72819 Decreased white blood cell count, unspecified: Secondary | ICD-10-CM | POA: Insufficient documentation

## 2016-02-20 DIAGNOSIS — K219 Gastro-esophageal reflux disease without esophagitis: Secondary | ICD-10-CM | POA: Insufficient documentation

## 2016-02-20 DIAGNOSIS — K529 Noninfective gastroenteritis and colitis, unspecified: Secondary | ICD-10-CM | POA: Insufficient documentation

## 2016-02-20 DIAGNOSIS — N200 Calculus of kidney: Secondary | ICD-10-CM | POA: Insufficient documentation

## 2016-02-20 DIAGNOSIS — G939 Disorder of brain, unspecified: Secondary | ICD-10-CM | POA: Insufficient documentation

## 2016-02-20 DIAGNOSIS — I6782 Cerebral ischemia: Secondary | ICD-10-CM | POA: Insufficient documentation

## 2016-02-20 DIAGNOSIS — E034 Atrophy of thyroid (acquired): Secondary | ICD-10-CM | POA: Insufficient documentation

## 2016-02-20 DIAGNOSIS — D649 Anemia, unspecified: Secondary | ICD-10-CM | POA: Insufficient documentation

## 2016-02-20 DIAGNOSIS — I499 Cardiac arrhythmia, unspecified: Secondary | ICD-10-CM | POA: Insufficient documentation

## 2016-02-20 DIAGNOSIS — Z1231 Encounter for screening mammogram for malignant neoplasm of breast: Secondary | ICD-10-CM

## 2016-02-20 DIAGNOSIS — Z0000 Encounter for general adult medical examination without abnormal findings: Secondary | ICD-10-CM

## 2016-02-20 DIAGNOSIS — Z126 Encounter for screening for malignant neoplasm of bladder: Secondary | ICD-10-CM

## 2016-02-20 HISTORY — DX: Noninfective gastroenteritis and colitis, unspecified: K52.9

## 2016-02-20 LAB — POCT URINALYSIS DIPSTICK
BILIRUBIN UA: NEGATIVE
Blood, UA: NEGATIVE
GLUCOSE UA: NEGATIVE
KETONES UA: NEGATIVE
LEUKOCYTES UA: NEGATIVE
Nitrite, UA: NEGATIVE
Spec Grav, UA: 1.025
Urobilinogen, UA: 0.2
pH, UA: 6

## 2016-02-20 MED ORDER — ATORVASTATIN CALCIUM 20 MG PO TABS: 20.0000 mg | ORAL_TABLET | Freq: Every day | ORAL | Status: DC

## 2016-02-20 MED ORDER — ATENOLOL 25 MG PO TABS: 25.0000 mg | ORAL_TABLET | Freq: Every day | ORAL | Status: DC

## 2016-02-20 MED ORDER — AMLODIPINE BESYLATE 5 MG PO TABS: 5.0000 mg | ORAL_TABLET | Freq: Every day | ORAL | Status: DC

## 2016-02-20 NOTE — Progress Notes (Signed)
Patient: [name] [name], Female    DOB: September 09, 1946, 70 y.o.   MRN: [id number] Visit Date: 02/20/2016  Today's Provider: Margarita Rana, MD   Chief Complaint  Patient presents with  . Medicare Wellness   Subjective:    Annual wellness visit [name] [name] is a 70 y.o. female. She feels well. She reports exercising about 6 days a week; stays active walking, doing yard work, Social research officer, government.. She reports she is sleeping well.  Last CPE- 12/27/2014 Last Pap- S/P hysterectomy in 2007 Last mammo- 01/30/2015- BI-RADS 1 Last BMD- 02/06/2015- Normal, bone thinning- recheck 2-3 years Last colonoscopy- 05/18/2013- Dr. Bary Castilla. Diverticulosis, tubular adenoma. Repeat 5 years. -----------------------------------------------------------  Hypertension, follow-up:  BP Readings from Last 3 Encounters:  02/20/16 108/56  02/06/14 138/90  03/28/13 120/80    She was last seen for hypertension 1 years ago.  BP at that visit was 118/72. Management since that visit includes none. She reports excellent compliance with treatment. She is having side effects. Lightheadedness with standing  She is exercising. She is adherent to low salt diet.   Outside blood pressures are 110's/60's. She is experiencing none.  Patient denies chest pain, dyspnea, fatigue, irregular heart beat, lower extremity edema and palpitations.   Cardiovascular risk factors include advanced age (older than 29 for men, 27 for women), dyslipidemia, family history of premature cardiovascular disease and hypertension.      Weight trend: stable Wt Readings from Last 3 Encounters:  02/20/16 195 lb (88.451 kg)  02/06/14 197 lb 8 oz (89.585 kg)  03/28/13 195 lb (88.451 kg)    Current diet: in general, a "healthy" diet    ------------------------------------------------------------------------     Review of Systems  HENT: Positive for hearing loss and tinnitus.   Eyes: Positive for itching.  Neurological: Positive for  light-headedness (with standing. Intermittent.).  Hematological: Bruises/bleeds easily.  All other systems reviewed and are negative.   Social History   Social History  . Marital Status: Married    Spouse Name: Herbie Baltimore   . Number of Children: 5  . Years of Education: HS   Occupational History  . Semi-Retired    Social History Main Topics  . Smoking status: Never Smoker   . Smokeless tobacco: Never Used     Comment: tobacco use - no  . Alcohol Use: No  . Drug Use: No  . Sexual Activity: Not on file   Other Topics Concern  . Not on file   Social History Narrative   Married, full time, exercise - walks some.    Past Medical History  Diagnosis Date  . HLD (hyperlipidemia)     mixed  . HTN (hypertension)   . Chest pain, unspecified   . H/O left breast biopsy 12/11/2005    [facility], path report matched the clinical impression of left breast lipoma     Patient Active Problem List   Diagnosis Date Noted  . Absolute anemia 02/20/2016  . Colitis 02/20/2016  . Acid reflux 02/20/2016  . Hypercholesteremia 02/20/2016  . BP (high blood pressure) 02/20/2016  . Irregular cardiac rhythm 02/20/2016  . Calculus of kidney 02/20/2016  . Decreased leukocytes 02/20/2016  . Atrophy of thyroid 02/20/2016  . Temporary cerebral vascular dysfunction 02/20/2016  . Neck pain on left side 02/06/2014  . Ankle swelling 02/06/2014  . Asymptomatic PVCs 02/06/2014  . HYPERLIPIDEMIA-MIXED 03/22/2009  . HYPERTENSION, UNSPECIFIED 03/22/2009  . CHEST PAIN-UNSPECIFIED 03/22/2009    Past Surgical History  Procedure Laterality Date  .  Abdominal hysterectomy  2007    total  . Colonoscopy  11/12/2001    multiple sessile polyps found in rectum, 54mm in size, distributed diffusely, biopsies were taken. The polyps extended from lower rectum to approximately 20 cm. They were confluent in the area between 10-15cm. Small polyps with central ulceration were noted.  . Breast biopsy  2008    [facility] bx done  12/11/2005    Her family history includes Cancer in her other; Diabetes in her brother, father, and sister; Healthy in her brother; Heart disease in her brother; Heart disease (age of onset: 41) in her father; Hyperlipidemia in her mother; Hypertension in her brother, mother, and sister; Kidney disease in her sister; Other in her mother.    Previous Medications   AMLODIPINE (NORVASC) 5 MG TABLET    Take 1 tablet by mouth daily.   ASPIRIN 81 MG TABLET    Take 81 mg by mouth daily.   ATENOLOL (TENORMIN) 25 MG TABLET    Take 25 mg by mouth 2 (two) times daily.     ATORVASTATIN (LIPITOR) 20 MG TABLET    Take 1 tablet by mouth daily.   MULTIPLE VITAMINS-MINERALS ER PO    Take by mouth.    Patient Care Team: Margarita Rana, MD as PCP - General (Family Medicine) Robert Bellow, MD (General Surgery)     Objective:   Vitals: BP 108/56 mmHg  Pulse 68  Temp(Src) 98.2 F (36.8 C) (Oral)  Resp 16  Ht 5\' 6"  (1.676 m)  Wt 195 lb (88.451 kg)  BMI 31.49 kg/m2  Physical Exam  Constitutional: She is oriented to person, place, and time. She appears well-developed and well-nourished.  HENT:  Head: Normocephalic and atraumatic.  Right Ear: Tympanic membrane, external ear and ear canal normal.  Left Ear: Tympanic membrane, external ear and ear canal normal.  Nose: Nose normal.  Mouth/Throat: Uvula is midline, oropharynx is clear and moist and mucous membranes are normal.  Eyes: Conjunctivae, EOM and lids are normal. Pupils are equal, round, and reactive to light.  Neck: Trachea normal and normal range of motion. Neck supple. Carotid bruit is not present. No thyroid mass and no thyromegaly present.  Cardiovascular: Normal rate, regular rhythm and normal heart sounds.   Pulmonary/Chest: Effort normal and breath sounds normal.  Abdominal: Soft. Normal appearance and bowel sounds are normal. There is no hepatosplenomegaly. There is no tenderness.  Genitourinary: No breast tenderness or discharge.    Left breast fullness. No change per pt.  Musculoskeletal: Normal range of motion.  Lymphadenopathy:    She has no cervical adenopathy.    She has no axillary adenopathy.  Neurological: She is alert and oriented to person, place, and time. She has normal strength. No cranial nerve deficit.  Skin: Skin is warm, dry and intact.  Psychiatric: She has a normal mood and affect. Her speech is normal and behavior is normal. Judgment and thought content normal. Cognition and memory are normal.    Activities of Daily Living In your present state of health, do you have any difficulty performing the following activities: 02/20/2016  Hearing? N  Vision? N  Difficulty concentrating or making decisions? N  Walking or climbing stairs? N  Dressing or bathing? N  Doing errands, shopping? N    Fall Risk Assessment Fall Risk  02/20/2016  Falls in the past year? No     Depression Screen PHQ 2/9 Scores 02/20/2016  PHQ - 2 Score 0    Cognitive Testing -  6-CIT  Correct? Score   What year is it? yes 0 0 or 4  What month is it? yes 0 0 or 3  Memorize:    Pia Mau,  41,  High [address]  [place],      What time is it? (within 1 hour) yes 0 0 or 3  Count backwards from 20 yes 0 0, 2, or 4  Name the months of the year yes 0 0, 2, or 4  Repeat name & address above no 4 0, 2, 4, 6, 8, or 10       TOTAL SCORE  4/28   Interpretation:  Normal  Normal (0-7) Abnormal (8-28)       Assessment & Plan:     Annual Wellness Visit  Reviewed patient's Family Medical History Reviewed and updated list of patient's medical providers Assessment of cognitive impairment was done Assessed patient's functional ability Established a written schedule for health screening [place] Completed and Reviewed  Exercise Activities and Dietary recommendations Goals    None      Immunization History  Administered Date(s) Administered  . Pneumococcal Conjugate-13 12/23/2013  . Pneumococcal  Polysaccharide-23 12/16/2010  . Td 10/06/2007    Health Maintenance  Topic Date Due  . Hepatitis C Screening  [birth year]  . TETANUS/TDAP  04/09/1964  . ZOSTAVAX  04/09/2005  . PNA vac Low Risk Adult (1 of 2 - PCV13) 04/09/2010  . INFLUENZA VACCINE  04/29/2016  . MAMMOGRAM  01/28/2017  . COLONOSCOPY  05/19/2023  . DEXA SCAN  Completed      Discussed health benefits of physical activity, and encouraged her to engage in regular exercise appropriate for her age and condition.    ------------------------------------------------------------------------------------------------------------ 1. Medicare annual wellness visit, subsequent Stable. As above.  2. Bladder cancer screening UA negative. - POCT urinalysis dipstick Results for orders placed or performed in visit on 02/20/16  POCT urinalysis dipstick  Result Value Ref Range   Color, UA Clear    Clarity, UA Yellow    Glucose, UA Negative    Bilirubin, UA Negative    Ketones, UA Negative    Spec Grav, UA 1.025    Blood, UA Negative    pH, UA 6.0    Protein, UA trace    Urobilinogen, UA 0.2    Nitrite, UA Negative    Leukocytes, UA Negative Negative     3. Essential hypertension Not to goal. Is too low, and pt is experiencing lightheadedness upon standing. Will decrease Atenolol to once a day, and continue Amlodipine once daily. Recheck 3 months. - CBC with Differential/Platelet - Comprehensive metabolic panel - atenolol (TENORMIN) 25 MG tablet; Take 1 tablet (25 mg total) by mouth daily.  Dispense: 90 tablet; Refill: 3 - amLODipine (NORVASC) 5 MG tablet; Take 1 tablet (5 mg total) by mouth daily.  Dispense: 90 tablet; Refill: 3  4. Hypercholesteremia Check labs. FU pending results. Refills provided. - Lipid panel - TSH - atorvastatin (LIPITOR) 20 MG tablet; Take 1 tablet (20 mg total) by mouth at bedtime.  Dispense: 90 tablet; Refill: 3  5. Encounter for screening mammogram for breast cancer Mammogram ordered as  below. - MM DIGITAL SCREENING BILATERAL; Future    Patient seen and examined by Jerrell Belfast, MD, and note scribed by Renaldo Fiddler, CMA.  I have reviewed the document for accuracy and completeness and I agree with above. Jerrell Belfast, MD   Margarita Rana, MD

## 2016-03-04 ENCOUNTER — Other Ambulatory Visit: Payer: Self-pay | Admitting: Family Medicine

## 2016-03-04 ENCOUNTER — Ambulatory Visit
Admission: RE | Admit: 2016-03-04 | Discharge: 2016-03-04 | Disposition: A | Discharge status: Home / Self Care | Payer: Commercial Managed Care - HMO | Source: Ambulatory Visit | Attending: Family Medicine | Admitting: Family Medicine

## 2016-03-04 DIAGNOSIS — E7800 Pure hypercholesterolemia, unspecified: Secondary | ICD-10-CM | POA: Diagnosis not present

## 2016-03-04 DIAGNOSIS — I10 Essential (primary) hypertension: Secondary | ICD-10-CM | POA: Diagnosis not present

## 2016-03-04 DIAGNOSIS — Z1231 Encounter for screening mammogram for malignant neoplasm of breast: Secondary | ICD-10-CM | POA: Insufficient documentation

## 2016-03-05 LAB — COMPREHENSIVE METABOLIC PANEL
ALK PHOS: 91 IU/L (ref 39–117)
ALT: 16 IU/L (ref 0–32)
AST: 17 IU/L (ref 0–40)
Albumin/Globulin Ratio: 1.5 (ref 1.2–2.2)
Albumin: 4.3 g/dL (ref 3.5–4.8)
BUN/Creatinine Ratio: 18 (ref 12–28)
BUN: 13 mg/dL (ref 8–27)
Bilirubin Total: 0.4 mg/dL (ref 0.0–1.2)
CO2: 24 mmol/L (ref 18–29)
CREATININE: 0.74 mg/dL (ref 0.57–1.00)
Calcium: 9.3 mg/dL (ref 8.7–10.3)
Chloride: 105 mmol/L (ref 96–106)
GFR calc Af Amer: 95 mL/min/{1.73_m2} (ref >59)
GFR calc non Af Amer: 82 mL/min/{1.73_m2} (ref >59)
GLOBULIN, TOTAL: 2.8 g/dL (ref 1.5–4.5)
GLUCOSE: 95 mg/dL (ref 65–99)
Potassium: 4.7 mmol/L (ref 3.5–5.2)
SODIUM: 144 mmol/L (ref 134–144)
Total Protein: 7.1 g/dL (ref 6.0–8.5)

## 2016-03-05 LAB — CBC WITH DIFFERENTIAL/PLATELET
BASOS ABS: 0 10*3/uL (ref 0.0–0.2)
Basos: 0 %
EOS (ABSOLUTE): 0.1 10*3/uL (ref 0.0–0.4)
Eos: 4 %
HEMATOCRIT: 39.5 % (ref 34.0–46.6)
Hemoglobin: 13.2 g/dL (ref 11.1–15.9)
Immature Grans (Abs): 0 10*3/uL (ref 0.0–0.1)
Immature Granulocytes: 0 %
LYMPHS ABS: 0.9 10*3/uL (ref 0.7–3.1)
Lymphs: 26 %
MCH: 31.6 pg (ref 26.6–33.0)
MCHC: 33.4 g/dL (ref 31.5–35.7)
MCV: 95 fL (ref 79–97)
MONOS ABS: 0.4 10*3/uL (ref 0.1–0.9)
Monocytes: 11 %
Neutrophils Absolute: 1.9 10*3/uL (ref 1.4–7.0)
Neutrophils: 59 %
PLATELETS: 198 10*3/uL (ref 150–379)
RBC: 4.18 x10E6/uL (ref 3.77–5.28)
RDW: 14.2 % (ref 12.3–15.4)
WBC: 3.3 10*3/uL — AB (ref 3.4–10.8)

## 2016-03-05 LAB — TSH: TSH: 1.46 u[IU]/mL (ref 0.450–4.500)

## 2016-03-05 LAB — LIPID PANEL
CHOLESTEROL TOTAL: 196 mg/dL (ref 100–199)
Chol/HDL Ratio: 2.9 ratio units (ref 0.0–4.4)
HDL: 67 mg/dL (ref >39)
LDL CALC: 114 mg/dL — AB (ref 0–99)
TRIGLYCERIDES: 75 mg/dL (ref 0–149)
VLDL Cholesterol Cal: 15 mg/dL (ref 5–40)

## 2016-03-06 ENCOUNTER — Telehealth: Payer: Self-pay

## 2016-03-06 NOTE — Telephone Encounter (Signed)
-----   Message from Margarita Rana, MD sent at 03/05/2016 11:21 AM EDT ----- Labs stable except for low WBC. This has been worked up in the past. Is stable. Make sure was not supposed to follow up with Hematologist. Thanks.

## 2016-03-06 NOTE — Telephone Encounter (Signed)
Pt saw Dr. Durenda Hurt [facility] hematologist) in October 2002. OV note states pt has chronic idiopathic leukopenia, "which is not of any clinical significance." Note states, "pt continue to be observed without health interventions". Renaldo Fiddler, CMA

## 2016-03-06 NOTE — Telephone Encounter (Signed)
LMTCB Emily Drozdowski, CMA  

## 2016-03-06 NOTE — Telephone Encounter (Signed)
Great.  Thanks.  Please notify patient. Thanks.

## 2016-03-07 NOTE — Telephone Encounter (Signed)
Pt advised as below

## 2016-05-23 ENCOUNTER — Encounter: Payer: Self-pay | Admitting: Family Medicine

## 2016-05-23 ENCOUNTER — Ambulatory Visit (INDEPENDENT_AMBULATORY_CARE_PROVIDER_SITE_OTHER): Payer: Commercial Managed Care - HMO | Admitting: Family Medicine

## 2016-05-23 VITALS — BP 138/80 | HR 70 | Temp 98.9°F | Resp 16 | Wt 196.0 lb

## 2016-05-23 DIAGNOSIS — D72819 Decreased white blood cell count, unspecified: Secondary | ICD-10-CM | POA: Diagnosis not present

## 2016-05-23 DIAGNOSIS — I10 Essential (primary) hypertension: Secondary | ICD-10-CM | POA: Diagnosis not present

## 2016-05-23 NOTE — Progress Notes (Signed)
Patient: [name] [name] Female    DOB: 04/03/1946   70 y.o.   MRN: [id number] Visit Date: 05/23/2016  Today's Provider: Lelon Huh, MD   Chief Complaint  Patient presents with  . Hypertension    follow up  . Hyperlipidemia    follow up   Subjective:    HPI  This is a previous patient of Dr. Venia Minks present today as new patient to me to establish care and follow up on chronic medical problems.    Hypertension, follow-up:  BP Readings from Last 3 Encounters:  02/20/16 (!) 108/56  02/06/14 138/90  03/28/13 120/80    She was last seen for hypertension 3 months ago.  BP at that visit was 108/56. Management since that visit includes decreasing Atenolol to once a day and continuing Amlodipine daily. She reports good compliance with treatment. She is not having side effects.  She is exercising. She is adherent to low salt diet.   Outside blood pressures are 120s-130s most of the time.  She is experiencing lower extremity edema.  Patient denies chest pain, chest pressure/discomfort, claudication, dyspnea, exertional chest pressure/discomfort, fatigue, irregular heart beat, near-syncope, orthopnea, palpitations, paroxysmal nocturnal dyspnea, syncope and tachypnea.   Cardiovascular risk factors include advanced age (older than 82 for men, 51 for women) and hypertension.  Use of agents associated with hypertension: NSAIDS.     Weight trend: stable Wt Readings from Last 3 Encounters:  02/20/16 195 lb (88.5 kg)  02/06/14 197 lb 8 oz (89.6 kg)  03/28/13 195 lb (88.5 kg)    Current diet: in general, a "healthy" diet    ------------------------------------------------------------------------  Lipid/Cholesterol, Follow-up:   Last seen for this3 months ago.  Management changes since that visit include none. . Last Lipid Panel:    Component Value Date/Time   CHOL 196 03/04/2016 0810   TRIG 75 03/04/2016 0810   HDL 67 03/04/2016 0810   CHOLHDL 2.9 03/04/2016 0810   LDLCALC 114 (H) 03/04/2016 0810    Risk factors for vascular disease include hypercholesterolemia and hypertension  She reports good compliance with treatment. She is not having side effects.  Current symptoms include none and have been stable. Weight trend: stable Prior visit with dietician: no Current diet: in general, a "healthy" diet   Current exercise: walking  Wt Readings from Last 3 Encounters:  02/20/16 195 lb (88.5 kg)  02/06/14 197 lb 8 oz (89.6 kg)  03/28/13 195 lb (88.5 kg)    -------------------------------------------------------------------  She was also to have slightly low WBC=3.3 (n>=3.4) on labs in May which she would like rechecked.     No Known Allergies Current Meds  Medication Sig  . amLODipine (NORVASC) 5 MG tablet Take 1 tablet (5 mg total) by mouth daily.  Marland Kitchen aspirin 81 MG tablet Take 81 mg by mouth daily.  Marland Kitchen atenolol (TENORMIN) 25 MG tablet Take 1 tablet (25 mg total) by mouth daily.  Marland Kitchen atorvastatin (LIPITOR) 20 MG tablet Take 1 tablet (20 mg total) by mouth at bedtime.  . MULTIPLE VITAMINS-MINERALS ER PO Take by mouth.    Review of Systems  Constitutional: Negative for appetite change, chills, fatigue and fever.  Respiratory: Negative for chest tightness and shortness of breath.   Cardiovascular: Negative for chest pain and palpitations.       Swelling occasionally in ankles  Gastrointestinal: Negative for abdominal pain, nausea and vomiting.  Endocrine: Negative for cold intolerance, heat intolerance, polydipsia, polyphagia and polyuria.  Neurological: Negative for  dizziness and weakness.    Social History  Substance Use Topics  . Smoking status: Never Smoker  . Smokeless tobacco: Never Used     Comment: tobacco use - no  . Alcohol use No   Objective:   BP 138/80 (BP Location: Left Arm, Patient Position: Sitting, Cuff Size: Large)   Pulse 70   Temp 98.9 F (37.2 C) (Oral)   Resp 16   Wt 196 lb (88.9 kg)   SpO2 96% Comment: room air   BMI 31.64 kg/m   Physical Exam   General Appearance:    Alert, cooperative, no distress  Eyes:    PERRL, conjunctiva/corneas clear, EOM's intact       Lungs:     Clear to auscultation bilaterally, respirations unlabored  Heart:    Regular rate and rhythm  Neurologic:   Awake, alert, oriented x 3. No apparent focal neurological           defect.           Assessment & Plan:     1. Essential hypertension Well controlled on lower dose of atenolol and amlodipine. Continue current medications.    2. Leukopenia  - CBC  Return in about 9 months (around 02/19/2017) for Yearly Physical.        Lelon Huh, MD  [facility]

## 2016-05-24 LAB — CBC
HEMATOCRIT: 36.8 % (ref 34.0–46.6)
Hemoglobin: 12.3 g/dL (ref 11.1–15.9)
MCH: 31.3 pg (ref 26.6–33.0)
MCHC: 33.4 g/dL (ref 31.5–35.7)
MCV: 94 fL (ref 79–97)
PLATELETS: 198 10*3/uL (ref 150–379)
RBC: 3.93 x10E6/uL (ref 3.77–5.28)
RDW: 14.3 % (ref 12.3–15.4)
WBC: 4.2 10*3/uL (ref 3.4–10.8)

## 2016-05-26 ENCOUNTER — Telehealth: Payer: Self-pay

## 2016-05-26 NOTE — Telephone Encounter (Signed)
LMTCB

## 2016-05-26 NOTE — Telephone Encounter (Signed)
-----   Message from Birdie Sons, MD sent at 05/24/2016  7:26 AM EDT ----- WBC count is normal. No other follow up needed.

## 2016-05-27 NOTE — Telephone Encounter (Signed)
Advised patient as below.  

## 2016-06-23 ENCOUNTER — Telehealth: Payer: Self-pay | Admitting: Family Medicine

## 2016-06-23 ENCOUNTER — Other Ambulatory Visit: Payer: Self-pay | Admitting: Family Medicine

## 2016-06-23 DIAGNOSIS — I10 Essential (primary) hypertension: Secondary | ICD-10-CM

## 2016-06-23 MED ORDER — ATENOLOL 25 MG PO TABS: 25.0000 mg | ORAL_TABLET | Freq: Every day | ORAL | 3 refills | Status: DC

## 2016-06-23 NOTE — Telephone Encounter (Signed)
We have to resend rx from a provider in this office.

## 2016-06-23 NOTE — Telephone Encounter (Signed)
Pt needs refill on her atenolol 25mg .  She [facility] order.  Thanks Con Memos

## 2016-06-23 NOTE — Telephone Encounter (Signed)
It looks like Dr. Venia Minks last refilled this prescription on 02/20/2016 for a qty of 90 with 3 refills. Patient should have enough refills to last until 01/2017. Tried calling patient to advise her of this. No answer, Left message to call back.

## 2016-06-23 NOTE — Telephone Encounter (Signed)
Prescription was sent to pharmacy by Lillia Abed- PA. Patient notified

## 2016-07-07 ENCOUNTER — Telehealth: Payer: Self-pay | Admitting: Family Medicine

## 2016-07-07 MED ORDER — METOPROLOL SUCCINATE ER 25 MG PO TB24: 25.0000 mg | ORAL_TABLET | Freq: Every day | ORAL | 3 refills | Status: DC

## 2016-07-07 NOTE — Telephone Encounter (Signed)
Have sent rx for metoprolol ER to [facility]

## 2016-07-07 NOTE — Telephone Encounter (Signed)
LMTCB-KW 

## 2016-07-07 NOTE — Telephone Encounter (Signed)
Pt stated that the atenolol (TENORMIN) 25 MG tablet is on back order and [facility] doesn't know when or if they will be able to fill this. Pt is requesting something else to replace this medication be sent to [facility]. Please advise. Thanks TNP

## 2016-07-07 NOTE — Telephone Encounter (Signed)
Please review. Thanks!  

## 2016-07-08 NOTE — Telephone Encounter (Signed)
LMOVM for pt to return call 

## 2016-07-09 ENCOUNTER — Telehealth: Payer: Self-pay | Admitting: Family Medicine

## 2016-07-09 NOTE — Telephone Encounter (Signed)
Patient advised.

## 2016-07-09 NOTE — Telephone Encounter (Signed)
error 

## 2016-07-09 NOTE — Telephone Encounter (Signed)
Pt is returning call.  CB#[phone]/MW

## 2016-07-10 ENCOUNTER — Ambulatory Visit (INDEPENDENT_AMBULATORY_CARE_PROVIDER_SITE_OTHER): Payer: Commercial Managed Care - HMO | Admitting: Family Medicine

## 2016-07-10 ENCOUNTER — Encounter: Payer: Self-pay | Admitting: Family Medicine

## 2016-07-10 DIAGNOSIS — M722 Plantar fascial fibromatosis: Secondary | ICD-10-CM | POA: Diagnosis not present

## 2016-07-10 MED ORDER — NAPROXEN 500 MG PO TABS: 500.0000 mg | ORAL_TABLET | Freq: Two times a day (BID) | ORAL | 0 refills | Status: DC

## 2016-07-10 NOTE — Patient Instructions (Signed)
Plantar Fasciitis Plantar fasciitis is a painful foot condition that affects the heel. It occurs when the band of tissue that connects the toes to the heel bone (plantar fascia) becomes irritated. This can happen after exercising too much or doing other repetitive activities (overuse injury). The pain from plantar fasciitis can range from mild irritation to severe pain that makes it difficult for you to walk or move. The pain is usually worse in the morning or after you have been sitting or lying down for a while. CAUSES This condition may be caused by:  Standing for long periods of time.  Wearing shoes that do not fit.  Doing high-impact activities, including running, aerobics, and ballet.  Being overweight.  Having an abnormal way of walking (gait).  Having tight calf muscles.  Having high arches in your feet.  Starting a new athletic activity. SYMPTOMS The main symptom of this condition is heel pain. Other symptoms include:  Pain that gets worse after activity or exercise.  Pain that is worse in the morning or after resting.  Pain that goes away after you walk for a few minutes. DIAGNOSIS This condition may be diagnosed based on your signs and symptoms. Your health care provider will also do a physical exam to check for:  A tender area on the bottom of your foot.  A high arch in your foot.  Pain when you move your foot.  Difficulty moving your foot. You may also need to have imaging studies to confirm the diagnosis. These can include:  X-rays.  Ultrasound.  MRI. TREATMENT  Treatment for plantar fasciitis depends on the severity of the condition. Your treatment may include:  Rest, ice, and over-the-counter pain medicines to manage your pain.  Exercises to stretch your calves and your plantar fascia.  A splint that holds your foot in a stretched, upward position while you sleep (night splint).  Physical therapy to relieve symptoms and prevent problems in the  future.  Cortisone injections to relieve severe pain.  Extracorporeal shock wave therapy (ESWT) to stimulate damaged plantar fascia with electrical impulses. It is often used as a last resort before surgery.  Surgery, if other treatments have not worked after 12 months. HOME CARE INSTRUCTIONS  Take medicines only as directed by your health care provider.  Avoid activities that cause pain.  Roll the bottom of your foot over a bag of ice or a bottle of cold water. Do this for 20 minutes, 3-4 times a day.  Perform simple stretches as directed by your health care provider.  Try wearing athletic shoes with air-sole or gel-sole cushions or soft shoe inserts.  Wear a night splint while sleeping, if directed by your health care provider.  Keep all follow-up appointments with your health care provider. PREVENTION   Do not perform exercises or activities that cause heel pain.  Consider finding low-impact activities if you continue to have problems.  Lose weight if you need to. The best way to prevent plantar fasciitis is to avoid the activities that aggravate your plantar fascia. SEEK MEDICAL CARE IF:  Your symptoms do not go away after treatment with home care measures.  Your pain gets worse.  Your pain affects your ability to move or do your daily activities.   This information is not intended to replace advice given to you by your health care provider. Make sure you discuss any questions you have with your health care provider.   Document Released: 06/10/2001 Document Revised: 06/06/2015 Document Reviewed: 07/26/2014 Elsevier   Interactive Patient Education 2016 Elsevier Inc.  

## 2016-07-10 NOTE — Progress Notes (Signed)
Patient: [name] [name] Female    DOB: [birth year]   70 y.o.   MRN: [id number] Visit Date: 07/10/2016  Today's Provider: Lelon Huh, MD   Chief Complaint  Patient presents with  . Foot Pain   Subjective:    Foot Pain  This is a new problem. Episode onset: 1 week ago. The problem occurs constantly. The problem has been gradually worsening (in the past 2 days). Associated symptoms include arthralgias (right foot pain) and joint swelling (right foot). Pertinent negatives include no abdominal pain, chest pain, chills, fatigue, fever, nausea, vomiting or weakness. The symptoms are aggravated by walking. Treatments tried: elevating foot and soaking im Epsom salt water. The treatment provided mild relief.  Pain and swelling is located in her right heel, very painful on first step in the morning. Eases up a little bit with walking, but then gets progressively worse throughout the day.      No Known Allergies   Current Outpatient Prescriptions:  .  amLODipine (NORVASC) 5 MG tablet, Take 1 tablet (5 mg total) by mouth daily., Disp: 90 tablet, Rfl: 3 .  aspirin 81 MG tablet, Take 81 mg by mouth daily., Disp: , Rfl:  .  atorvastatin (LIPITOR) 20 MG tablet, Take 1 tablet (20 mg total) by mouth at bedtime., Disp: 90 tablet, Rfl: 3 .  metoprolol succinate (TOPROL-XL) 25 MG 24 hr tablet, Take 1 tablet (25 mg total) by mouth daily. PLEASE CANCEL REFILLS FOR ATENOLOL 25MG , Disp: 90 tablet, Rfl: 3 .  MULTIPLE VITAMINS-MINERALS ER PO, Take by mouth., Disp: , Rfl:   Review of Systems  Constitutional: Negative for appetite change, chills, fatigue and fever.  Respiratory: Negative for chest tightness and shortness of breath.   Cardiovascular: Negative for chest pain and palpitations.  Gastrointestinal: Negative for abdominal pain, nausea and vomiting.  Musculoskeletal: Positive for arthralgias (right foot pain) and joint swelling (right foot).  Neurological: Negative for dizziness and weakness.      Social History  Substance Use Topics  . Smoking status: Never Smoker  . Smokeless tobacco: Never Used     Comment: tobacco use - no  . Alcohol use No   Objective:   BP 116/70 (BP Location: Left Arm, Patient Position: Sitting, Cuff Size: Large)   Pulse 90   Temp 98.4 F (36.9 C) (Oral)   Resp 16   Wt 190 lb (86.2 kg)   SpO2 97% Comment: room air  BMI 30.67 kg/m   Physical Exam   General Appearance:    Alert, cooperative, no distress  Eyes:    PERRL, conjunctiva/corneas clear, EOM's intact       Lungs:     Clear to auscultation bilaterally, respirations unlabored  Heart:    Regular rate and rhythm  Neurologic:   Awake, alert, oriented x 3. No apparent focal neurological           defect.   MS:   FROM of ankle. Tender anterior calcaneous. No swelling or erythema.        Assessment & Plan:     1. Plantar fasciitis, right Discussed, NSAIDs, heel cushions, stretching exercises, and night splints. Call for podiatry referral if not greatly improved in 1-2 weeks.  - naproxen (NAPROSYN) 500 MG tablet; Take 1 tablet (500 mg total) by mouth 2 (two) times daily with a meal.  Dispense: 30 tablet; Refill: 0           Lelon Huh, MD  [facility]

## 2016-07-17 DIAGNOSIS — H524 Presbyopia: Secondary | ICD-10-CM | POA: Diagnosis not present

## 2016-11-04 DIAGNOSIS — H40003 Preglaucoma, unspecified, bilateral: Secondary | ICD-10-CM | POA: Diagnosis not present

## 2017-01-30 ENCOUNTER — Other Ambulatory Visit: Payer: Self-pay | Admitting: Family Medicine

## 2017-01-30 DIAGNOSIS — E7800 Pure hypercholesterolemia, unspecified: Secondary | ICD-10-CM

## 2017-01-30 DIAGNOSIS — I10 Essential (primary) hypertension: Secondary | ICD-10-CM

## 2017-01-30 MED ORDER — AMLODIPINE BESYLATE 5 MG PO TABS: 5.0000 mg | ORAL_TABLET | Freq: Every day | ORAL | 3 refills | Status: DC

## 2017-01-30 MED ORDER — ATORVASTATIN CALCIUM 20 MG PO TABS: 20.0000 mg | ORAL_TABLET | Freq: Every day | ORAL | 3 refills | Status: DC

## 2017-01-30 NOTE — Telephone Encounter (Signed)
Pt contacted office for refill request on the following medications:  [employer] mail order.  90 day supply.  CB#[phone]/MW  atorvastatin (LIPITOR) 20 MG tablet  amLODipine (NORVASC) 5 MG tablet

## 2017-02-05 ENCOUNTER — Other Ambulatory Visit: Payer: Self-pay | Admitting: Family Medicine

## 2017-02-05 DIAGNOSIS — Z1231 Encounter for screening mammogram for malignant neoplasm of breast: Secondary | ICD-10-CM

## 2017-02-20 ENCOUNTER — Encounter: Payer: Self-pay | Admitting: Family Medicine

## 2017-02-20 ENCOUNTER — Ambulatory Visit (INDEPENDENT_AMBULATORY_CARE_PROVIDER_SITE_OTHER): Payer: Medicare HMO | Admitting: Family Medicine

## 2017-02-20 VITALS — BP 134/74 | HR 74 | Temp 98.5°F | Resp 16 | Ht 67.0 in | Wt 187.0 lb

## 2017-02-20 DIAGNOSIS — D649 Anemia, unspecified: Secondary | ICD-10-CM

## 2017-02-20 DIAGNOSIS — E7800 Pure hypercholesterolemia, unspecified: Secondary | ICD-10-CM | POA: Diagnosis not present

## 2017-02-20 DIAGNOSIS — I499 Cardiac arrhythmia, unspecified: Secondary | ICD-10-CM

## 2017-02-20 DIAGNOSIS — K219 Gastro-esophageal reflux disease without esophagitis: Secondary | ICD-10-CM | POA: Diagnosis not present

## 2017-02-20 DIAGNOSIS — I10 Essential (primary) hypertension: Secondary | ICD-10-CM

## 2017-02-20 DIAGNOSIS — Z0000 Encounter for general adult medical examination without abnormal findings: Secondary | ICD-10-CM | POA: Diagnosis not present

## 2017-02-20 DIAGNOSIS — M8580 Other specified disorders of bone density and structure, unspecified site: Secondary | ICD-10-CM | POA: Diagnosis not present

## 2017-02-20 DIAGNOSIS — Z1159 Encounter for screening for other viral diseases: Secondary | ICD-10-CM | POA: Diagnosis not present

## 2017-02-20 DIAGNOSIS — Z780 Asymptomatic menopausal state: Secondary | ICD-10-CM | POA: Insufficient documentation

## 2017-02-20 NOTE — Progress Notes (Signed)
Patient: [name] [name], Female    DOB: July 08, 1946, 71 y.o.   MRN: [id number] Visit Date: 02/20/2017  Today's Provider: Lelon Huh, MD   Chief Complaint  Patient presents with  . Annual Exam  . Hypertension  . Hyperlipidemia   Subjective:    Annual physical [name] [name] is a 70 y.o. female. She feels well. She reports no regular exercise, but she does stay active. She reports she is sleeping well.  Colonoscopy- 05/18/2013. 1 88mm polyps. Diverticulosis.  BMD- 02/06/2015. Mammogram- 03/04/2016. Normal. Repeat in 1 year. Total Hysterectomy- 2007   Hypertension, follow-up:  BP Readings from Last 3 Encounters:  02/20/17 134/74  07/10/16 116/70  05/23/16 138/80    She was last seen for hypertension 9 months ago.  BP at that visit was 138/80. Management since that visit includes no changes. She reports good compliance with treatment. She is not having side effects.  She is exercising. She is adherent to low salt diet.   Outside blood pressures are checked occasionally She is experiencing none.  Patient denies none.   Cardiovascular risk factors include dyslipidemia.    Weight trend: stable Wt Readings from Last 3 Encounters:  02/20/17 187 lb (84.8 kg)  07/10/16 190 lb (86.2 kg)  05/23/16 196 lb (88.9 kg)    Current diet: well balanced    Lipid/Cholesterol, Follow-up:   Last seen for this1 years ago.  Management changes since that visit include no changes. . Last Lipid Panel:    Component Value Date/Time   CHOL 196 03/04/2016 0810   TRIG 75 03/04/2016 0810   HDL 67 03/04/2016 0810   CHOLHDL 2.9 03/04/2016 0810   LDLCALC 114 (H) 03/04/2016 0810    Risk factors for vascular disease include hypertension  She reports good compliance with treatment. She is not having side effects.  Current symptoms include none and have been stable. Weight trend: stable Prior visit with dietician: no Current diet: well balanced Current exercise: none  Wt  Readings from Last 3 Encounters:  02/20/17 187 lb (84.8 kg)  07/10/16 190 lb (86.2 kg)  05/23/16 196 lb (88.9 kg)        Review of Systems  Constitutional: Negative.   HENT: Negative.   Eyes: Negative.   Respiratory: Negative.   Cardiovascular: Negative.   Gastrointestinal: Negative.   Endocrine: Negative.   Genitourinary: Negative.   Musculoskeletal: Negative.   Skin: Negative.   Allergic/Immunologic: Negative.   Neurological: Negative.   Hematological: Negative.   Psychiatric/Behavioral: Negative.     Social History   Social History  . Marital status: Married    Spouse name: [name] [name]   . Number of children: 5  . Years of education: HS   Occupational History  . Semi-Retired    Social History Main Topics  . Smoking status: Never Smoker  . Smokeless tobacco: Never Used     Comment: tobacco use - no  . Alcohol use No  . Drug use: No  . Sexual activity: Not on file   Other Topics Concern  . Not on file   Social History Narrative   Married, full time, exercise - walks some.    Past Medical History:  Diagnosis Date  . Chest pain, unspecified   . H/O left breast biopsy 12/11/2005   [facility], path report matched the clinical impression of left breast lipoma  . HLD (hyperlipidemia)    mixed  . HTN (hypertension)      Patient Active Problem List  Diagnosis Date Noted  . Plantar fasciitis, right 07/10/2016  . Absolute anemia 02/20/2016  . Colitis 02/20/2016  . Acid reflux 02/20/2016  . Hypercholesteremia 02/20/2016  . BP (high blood pressure) 02/20/2016  . Irregular cardiac rhythm 02/20/2016  . Calculus of kidney 02/20/2016  . Decreased leukocytes 02/20/2016  . Atrophy of thyroid 02/20/2016  . Temporary cerebral vascular dysfunction 02/20/2016  . Neck pain on left side 02/06/2014  . Ankle swelling 02/06/2014  . Asymptomatic PVCs 02/06/2014  . HYPERLIPIDEMIA-MIXED 03/22/2009  . HYPERTENSION, UNSPECIFIED 03/22/2009  . CHEST PAIN-UNSPECIFIED 03/22/2009     Past Surgical History:  Procedure Laterality Date  . ABDOMINAL HYSTERECTOMY  2007   total  . BREAST BIOPSY Left 2007   Dr. Dwyane Luo office-benign  . COLONOSCOPY  11/12/2001   multiple sessile polyps found in rectum, 58mm in size, distributed diffusely, biopsies were taken. The polyps extended from lower rectum to approximately 20 cm. They were confluent in the area between 10-15cm. Small polyps with central ulceration were noted.    Her family history includes Cancer in her other; Diabetes in her brother, father, and sister; Healthy in her brother; Heart disease in her brother; Heart disease (age of onset: 19) in her father; Hyperlipidemia in her mother; Hypertension in her brother, mother, and sister; Kidney disease in her sister; Other in her mother.      Current Outpatient Prescriptions:  .  amLODipine (NORVASC) 5 MG tablet, Take 1 tablet (5 mg total) by mouth daily., Disp: 90 tablet, Rfl: 3 .  aspirin 81 MG tablet, Take 81 mg by mouth daily., Disp: , Rfl:  .  atorvastatin (LIPITOR) 20 MG tablet, Take 1 tablet (20 mg total) by mouth at bedtime., Disp: 90 tablet, Rfl: 3 .  metoprolol succinate (TOPROL-XL) 25 MG 24 hr tablet, Take 1 tablet (25 mg total) by mouth daily. PLEASE CANCEL REFILLS FOR ATENOLOL 25MG , Disp: 90 tablet, Rfl: 3 .  MULTIPLE VITAMINS-MINERALS ER PO, Take by mouth., Disp: , Rfl:  .  naproxen (NAPROSYN) 500 MG tablet, Take 1 tablet (500 mg total) by mouth 2 (two) times daily with a meal., Disp: 30 tablet, Rfl: 0  Patient Care Team: Birdie Sons, MD as PCP - General (Family Medicine) Bary Castilla, Forest Gleason, MD (General Surgery)     Objective:   Vitals: BP 134/74 (BP Location: Right Arm, Patient Position: Sitting, Cuff Size: Normal)   Pulse 74   Temp 98.5 F (36.9 C)   Resp 16   Ht 5\' 7"  (1.702 m)   Wt 187 lb (84.8 kg)   SpO2 95%   BMI 29.29 kg/m   Physical Exam  Constitutional: She is oriented to person, place, and time. She appears well-developed and  well-nourished.  HENT:  Head: Normocephalic and atraumatic.  Right Ear: External ear normal.  Left Ear: External ear normal.  Nose: Nose normal.  Mouth/Throat: Oropharynx is clear and moist.  Eyes: Conjunctivae and EOM are normal. Pupils are equal, round, and reactive to light.  Neck: Normal range of motion. Neck supple.  Cardiovascular: Normal rate, regular rhythm and normal heart sounds.   Pulmonary/Chest: Effort normal and breath sounds normal. Right breast exhibits no inverted nipple, no mass, no nipple discharge, no skin change and no tenderness. Left breast exhibits no inverted nipple, no mass, no nipple discharge, no skin change and no tenderness. Breasts are symmetrical.  Abdominal: Soft. Bowel sounds are normal.  Musculoskeletal: Normal range of motion.  Neurological: She is alert and oriented to person, place, and time.  Skin: Skin is warm and dry.  Psychiatric: She has a normal mood and affect. Her behavior is normal. Judgment and thought content normal.    Activities of Daily Living In your present state of health, do you have any difficulty performing the following activities: 02/20/2017  Hearing? N  Vision? N  Difficulty concentrating or making decisions? N  Walking or climbing stairs? N  Dressing or bathing? N  Doing errands, shopping? N  Some recent data might be hidden    Fall Risk Assessment Fall Risk  02/20/2017 02/20/2016  Falls in the past year? No No     Depression Screen PHQ 2/9 Scores 02/20/2017 02/20/2016  PHQ - 2 Score 0 0  PHQ- 9 Score 1 -    Cognitive Testing - 6-CIT  Correct? Score   What year is it? yes 0 0 or 4  What month is it? yes 0 0 or 3  Memorize:    Pia Mau,  41,  [address]  [place],      What time is it? (within 1 hour) yes 0 0 or 3  Count backwards from 20 yes 0 0, 2, or 4  Name the months of the year yes 0 0, 2, or 4  Repeat name & address above yes 0 0, 2, 4, 6, 8, or 10       TOTAL SCORE  0/28   Interpretation:  Normal   Normal (0-7) Abnormal (8-28)       Assessment & Plan:     Annual physical Reviewed patient's Family Medical History Reviewed and updated list of patient's medical providers Assessment of cognitive impairment was done Assessed patient's functional ability Established a written schedule for health screening [place] Completed and Reviewed  Exercise Activities and Dietary recommendations Goals    None      Immunization History  Administered Date(s) Administered  . Pneumococcal Conjugate-13 12/23/2013  . Pneumococcal Polysaccharide-23 12/16/2010  . Td 10/06/2007    Health Maintenance  Topic Date Due  . Hepatitis C Screening  Feb 11, 1946  . INFLUENZA VACCINE  04/29/2017  . TETANUS/TDAP  10/05/2017  . MAMMOGRAM  03/04/2018  . COLONOSCOPY  05/19/2023  . DEXA SCAN  Completed  . PNA vac Low Risk Adult  Completed     Discussed health benefits of physical activity, and encouraged her to engage in regular exercise appropriate for her age and condition.    1. Annual physical Generally doing well.   2. Essential hypertension Well controlled.  Continue current medications.   - EKG 12-Lead - Comprehensive metabolic panel - TSH  3. Hypercholesteremia She is tolerating atorvastatin well with no adverse effects.   - Lipid panel - Comprehensive metabolic panel  4. Gastroesophageal reflux disease, esophagitis presence not specified Well controlled.   5. Irregular cardiac rhythm Normal EKG today.   6. . Need for hepatitis C screening test  - Hepatitis C antibody  7. Osteopenia, unspecified location BMD in 2019     Lelon Huh, MD  [facility]

## 2017-02-21 LAB — LIPID PANEL
CHOL/HDL RATIO: 2.7 ratio (ref 0.0–4.4)
CHOLESTEROL TOTAL: 199 mg/dL (ref 100–199)
HDL: 73 mg/dL (ref >39)
LDL CALC: 114 mg/dL — AB (ref 0–99)
Triglycerides: 61 mg/dL (ref 0–149)
VLDL CHOLESTEROL CAL: 12 mg/dL (ref 5–40)

## 2017-02-21 LAB — TSH: TSH: 0.807 u[IU]/mL (ref 0.450–4.500)

## 2017-02-21 LAB — COMPREHENSIVE METABOLIC PANEL
ALK PHOS: 99 IU/L (ref 39–117)
ALT: 21 IU/L (ref 0–32)
AST: 22 IU/L (ref 0–40)
Albumin/Globulin Ratio: 1.5 (ref 1.2–2.2)
Albumin: 4.3 g/dL (ref 3.5–4.8)
BUN/Creatinine Ratio: 19 (ref 12–28)
BUN: 11 mg/dL (ref 8–27)
Bilirubin Total: 0.4 mg/dL (ref 0.0–1.2)
CALCIUM: 9.3 mg/dL (ref 8.7–10.3)
CO2: 26 mmol/L (ref 18–29)
CREATININE: 0.59 mg/dL (ref 0.57–1.00)
Chloride: 103 mmol/L (ref 96–106)
GFR calc Af Amer: 107 mL/min/{1.73_m2} (ref >59)
GFR, EST NON AFRICAN AMERICAN: 93 mL/min/{1.73_m2} (ref >59)
GLUCOSE: 91 mg/dL (ref 65–99)
Globulin, Total: 2.8 g/dL (ref 1.5–4.5)
Potassium: 4.3 mmol/L (ref 3.5–5.2)
SODIUM: 143 mmol/L (ref 134–144)
Total Protein: 7.1 g/dL (ref 6.0–8.5)

## 2017-02-21 LAB — HEPATITIS C ANTIBODY

## 2017-02-24 ENCOUNTER — Telehealth: Payer: Self-pay

## 2017-02-24 NOTE — Telephone Encounter (Signed)
LMTCB-KW 

## 2017-02-24 NOTE — Telephone Encounter (Signed)
-----   Message from Birdie Sons, MD sent at 02/21/2017  8:35 AM EDT ----- Blood sugar, kidney functions, electrolytes are all normal check yearly.

## 2017-02-25 NOTE — Telephone Encounter (Signed)
Patient was notified of results. Expressed understanding.  

## 2017-03-05 ENCOUNTER — Ambulatory Visit
Admission: RE | Admit: 2017-03-05 | Discharge: 2017-03-05 | Disposition: A | Discharge status: Home / Self Care | Payer: Commercial Managed Care - HMO | Source: Ambulatory Visit | Attending: Family Medicine | Admitting: Family Medicine

## 2017-03-05 DIAGNOSIS — Z1231 Encounter for screening mammogram for malignant neoplasm of breast: Secondary | ICD-10-CM | POA: Insufficient documentation

## 2017-03-17 ENCOUNTER — Telehealth: Payer: Self-pay | Admitting: Family Medicine

## 2017-03-17 NOTE — Telephone Encounter (Signed)
Returned call to pt. Spoke to pt's husband and gave mammo results.

## 2017-03-17 NOTE — Telephone Encounter (Signed)
Pt returned call from last week for test results.  She thinks it was for her mammogram.  Thanks Con Memos

## 2017-07-20 DIAGNOSIS — H524 Presbyopia: Secondary | ICD-10-CM | POA: Diagnosis not present

## 2017-07-20 DIAGNOSIS — Z0100 Encounter for examination of eyes and vision without abnormal findings: Secondary | ICD-10-CM | POA: Diagnosis not present

## 2017-08-31 ENCOUNTER — Other Ambulatory Visit: Payer: Self-pay | Admitting: Family Medicine

## 2018-02-04 ENCOUNTER — Telehealth: Payer: Self-pay

## 2018-02-04 NOTE — Telephone Encounter (Signed)
LMTCB and schedule AWV prior to CPE on 02/26/18. -MM

## 2018-02-05 ENCOUNTER — Other Ambulatory Visit: Payer: Self-pay | Admitting: Family Medicine

## 2018-02-05 NOTE — Telephone Encounter (Signed)
Pt scheduled 02/16/18

## 2018-02-16 ENCOUNTER — Ambulatory Visit (INDEPENDENT_AMBULATORY_CARE_PROVIDER_SITE_OTHER): Payer: Medicare HMO

## 2018-02-16 VITALS — BP 132/74 | HR 85 | Temp 99.0°F | Ht 67.0 in | Wt 191.2 lb

## 2018-02-16 DIAGNOSIS — Z0000 Encounter for general adult medical examination without abnormal findings: Secondary | ICD-10-CM | POA: Diagnosis not present

## 2018-02-16 NOTE — Patient Instructions (Addendum)
[name] , Thank you for taking time to come for your Medicare Wellness Visit. I appreciate your ongoing commitment to your health goals. Please review the following plan we discussed and let me know if I can assist you in the future.   Screening recommendations/referrals: Colonoscopy: Up to date Mammogram: Up to date Bone Density: Up to date Recommended yearly ophthalmology/optometry visit for glaucoma screening and checkup Recommended yearly dental visit for hygiene and checkup  Vaccinations: Influenza vaccine: Up to date Pneumococcal vaccine: Up to date Tdap vaccine: Pt declines today.  Shingles vaccine: Pt declines today.     Advanced directives: Advance directive discussed with you today. I have provided a copy for you to complete at home and have notarized. Once this is complete please bring a copy in to our office so we can scan it into your chart.  Conditions/risks identified: Recommend to start exercising 3 days a week for at least 30 minutes at at time.   Next appointment: 02/26/18 @ 9:00 AM.   Preventive Care 65 Years and Older, Female Preventive care refers to lifestyle choices and visits with your health care provider that can promote health and wellness. What does preventive care include?  A yearly physical exam. This is also called an annual well check.  Dental exams once or twice a year.  Routine eye exams. Ask your health care provider how often you should have your eyes checked.  Personal lifestyle choices, including:  Daily care of your teeth and gums.  Regular physical activity.  Eating a healthy diet.  Avoiding tobacco and drug use.  Limiting alcohol use.  Practicing safe sex.  Taking low-dose aspirin every day.  Taking vitamin and mineral supplements as recommended by your health care provider. What happens during an annual well check? The services and screenings done by your health care provider during your annual well check will depend on  your age, overall health, lifestyle risk factors, and family history of disease. Counseling  Your health care provider may ask you questions about your:  Alcohol use.  Tobacco use.  Drug use.  Emotional well-being.  Home and relationship well-being.  Sexual activity.  Eating habits.  History of falls.  Memory and ability to understand (cognition).  Work and work Statistician.  Reproductive health. Screening  You may have the following tests or measurements:  Height, weight, and BMI.  Blood pressure.  Lipid and cholesterol levels. These may be checked every 5 years, or more frequently if you are over 34 years old.  Skin check.  Lung cancer screening. You may have this screening every year starting at age 6 if you have a 30-pack-year history of smoking and currently smoke or have quit within the past 15 years.  Fecal occult blood test (FOBT) of the stool. You may have this test every year starting at age 27.  Flexible sigmoidoscopy or colonoscopy. You may have a sigmoidoscopy every 5 years or a colonoscopy every 10 years starting at age 56.  Hepatitis C blood test.  Hepatitis B blood test.  Sexually transmitted disease (STD) testing.  Diabetes screening. This is done by checking your blood sugar (glucose) after you have not eaten for a while (fasting). You may have this done every 1-3 years.  Bone density scan. This is done to screen for osteoporosis. You may have this done starting at age 30.  Mammogram. This may be done every 1-2 years. Talk to your health care provider about how often you should have regular mammograms. Talk  with your health care provider about your test results, treatment options, and if necessary, the need for more tests. Vaccines  Your health care provider may recommend certain vaccines, such as:  Influenza vaccine. This is recommended every year.  Tetanus, diphtheria, and acellular pertussis (Tdap, Td) vaccine. You may need a Td booster  every 10 years.  Zoster vaccine. You may need this after age 11.  Pneumococcal 13-valent conjugate (PCV13) vaccine. One dose is recommended after age 68.  Pneumococcal polysaccharide (PPSV23) vaccine. One dose is recommended after age 56. Talk to your health care provider about which screenings and vaccines you need and how often you need them. This information is not intended to replace advice given to you by your health care provider. Make sure you discuss any questions you have with your health care provider. Document Released: 10/12/2015 Document Revised: 06/04/2016 Document Reviewed: 07/17/2015 Elsevier Interactive Patient Education  2017 [place] Prevention in the Home Falls can cause injuries. They can happen to people of all ages. There are many things you can do to make your home safe and to help prevent falls. What can I do on the outside of my home?  Regularly fix the edges of walkways and driveways and fix any cracks.  Remove anything that might make you trip as you walk through a door, such as a raised step or threshold.  Trim any bushes or trees on the path to your home.  Use bright outdoor lighting.  Clear any walking paths of anything that might make someone trip, such as rocks or tools.  Regularly check to see if handrails are loose or broken. Make sure that both sides of any steps have handrails.  Any raised decks and porches should have guardrails on the edges.  Have any leaves, snow, or ice cleared regularly.  Use sand or salt on walking paths during winter.  Clean up any spills in your garage right away. This includes oil or grease spills. What can I do in the bathroom?  Use night lights.  Install grab bars by the toilet and in the tub and shower. Do not use towel bars as grab bars.  Use non-skid mats or decals in the tub or shower.  If you need to sit down in the shower, use a plastic, non-slip stool.  Keep the floor dry. Clean up any  water that spills on the floor as soon as it happens.  Remove soap buildup in the tub or shower regularly.  Attach bath mats securely with double-sided non-slip rug tape.  Do not have throw rugs and other things on the floor that can make you trip. What can I do in the bedroom?  Use night lights.  Make sure that you have a light by your bed that is easy to reach.  Do not use any sheets or blankets that are too big for your bed. They should not hang down onto the floor.  Have a firm chair that has side arms. You can use this for support while you get dressed.  Do not have throw rugs and other things on the floor that can make you trip. What can I do in the kitchen?  Clean up any spills right away.  Avoid walking on wet floors.  Keep items that you use a lot in easy-to-reach places.  If you need to reach something above you, use a strong step stool that has a grab bar.  Keep electrical cords out of the way.  Do  not use floor polish or wax that makes floors slippery. If you must use wax, use non-skid floor wax.  Do not have throw rugs and other things on the floor that can make you trip. What can I do with my stairs?  Do not leave any items on the stairs.  Make sure that there are handrails on both sides of the stairs and use them. Fix handrails that are broken or loose. Make sure that handrails are as long as the stairways.  Check any carpeting to make sure that it is firmly attached to the stairs. Fix any carpet that is loose or worn.  Avoid having throw rugs at the top or bottom of the stairs. If you do have throw rugs, attach them to the floor with carpet tape.  Make sure that you have a light switch at the top of the stairs and the bottom of the stairs. If you do not have them, ask someone to add them for you. What else can I do to help prevent falls?  Wear shoes that:  Do not have high heels.  Have rubber bottoms.  Are comfortable and fit you well.  Are closed  at the toe. Do not wear sandals.  If you use a stepladder:  Make sure that it is fully opened. Do not climb a closed stepladder.  Make sure that both sides of the stepladder are locked into place.  Ask someone to hold it for you, if possible.  Clearly mark and make sure that you can see:  Any grab bars or handrails.  First and last steps.  Where the edge of each step is.  Use tools that help you move around (mobility aids) if they are needed. These include:  Canes.  Walkers.  Scooters.  Crutches.  Turn on the lights when you go into a dark area. Replace any light bulbs as soon as they burn out.  Set up your furniture so you have a clear path. Avoid moving your furniture around.  If any of your floors are uneven, fix them.  If there are any pets around you, be aware of where they are.  Review your medicines with your doctor. Some medicines can make you feel dizzy. This can increase your chance of falling. Ask your doctor what other things that you can do to help prevent falls. This information is not intended to replace advice given to you by your health care provider. Make sure you discuss any questions you have with your health care provider. Document Released: 07/12/2009 Document Revised: 02/21/2016 Document Reviewed: 10/20/2014 Elsevier Interactive Patient Education  2017 [employer].

## 2018-02-16 NOTE — Progress Notes (Signed)
Subjective:   [name] [name] is a 72 y.o. female who presents for Medicare Annual (Subsequent) preventive examination.  Review of Systems:  N/A  Cardiac Risk Factors include: advanced age (>30men, >49 women);dyslipidemia;hypertension     Objective:     Vitals: BP 132/74 (BP Location: Right Arm)   Pulse 85   Temp 99 F (37.2 C) (Oral)   Ht 5\' 7"  (1.702 m)   Wt 191 lb 3.2 oz (86.7 kg)   BMI 29.95 kg/m   Body mass index is 29.95 kg/m.  Advanced Directives 02/16/2018 02/20/2016  Does Patient Have a Medical Advance Directive? No No  Would patient like information on creating a medical advance directive? Yes (MAU/Ambulatory/Procedural Areas - Information given) -    Tobacco Social History   Tobacco Use  Smoking Status Never Smoker  Smokeless Tobacco Never Used  Tobacco Comment   tobacco use - no     Counseling given: Not Answered Comment: tobacco use - no   Clinical Intake:  Pre-visit preparation completed: Yes  Pain : No/denies pain Pain Score: 0-No pain     Nutritional Status: BMI 25 -29 Overweight Nutritional Risks: None Diabetes: No  How often do you need to have someone help you when you read instructions, pamphlets, or other written materials from your doctor or pharmacy?: 1 - Never  Interpreter Needed?: No  Information entered by :: [facility], LPN  Past Medical History:  Diagnosis Date  . Chest pain, unspecified   . H/O left breast biopsy 12/11/2005   [facility], path report matched the clinical impression of left breast lipoma  . HLD (hyperlipidemia)    mixed  . HTN (hypertension)    Past Surgical History:  Procedure Laterality Date  . ABDOMINAL HYSTERECTOMY  2007   total  . BREAST BIOPSY Left 2007   Dr. Dwyane Luo office-benign  . COLONOSCOPY  11/12/2001   multiple sessile polyps found in rectum, 94mm in size, distributed diffusely, biopsies were taken. The polyps extended from lower rectum to approximately 20 cm. They were confluent in the area  between 10-15cm. Small polyps with central ulceration were noted.   Family History  Problem Relation Age of Onset  . Cancer Other        ovarian, breast, colon cancers  . Hypertension Mother   . Hyperlipidemia Mother   . Other Mother        enlarged heart  . Heart disease Father 66  . Diabetes Father   . Diabetes Sister   . Hypertension Sister   . Kidney disease Sister   . Stroke Sister   . Heart disease Brother   . Hypertension Brother   . Diabetes Brother   . Healthy Brother   . Cancer Unknown        family hx  . Coronary artery disease Unknown        family hx  . Breast cancer Neg Hx    Social History   Socioeconomic History  . Marital status: Married    Spouse name: Herbie [place]   . Number of children: 5  . Years of education: HS  . Highest education level: 12th grade  Occupational History  . Occupation: Semi-Retired  Social Needs  . Financial resource strain: Not hard at all  . Food insecurity:    Worry: Never true    Inability: Never true  . Transportation needs:    Medical: No    Non-medical: No  Tobacco Use  . Smoking status: Never Smoker  . Smokeless tobacco: Never  Used  . Tobacco comment: tobacco use - no  Substance and Sexual Activity  . Alcohol use: No  . Drug use: No  . Sexual activity: Not on file  Lifestyle  . Physical activity:    Days per week: Not on file    Minutes per session: Not on file  . Stress: Only a little  Relationships  . Social connections:    Talks on phone: Not on file    Gets together: Not on file    Attends religious service: Not on file    Active member of club or organization: Not on file    Attends meetings of clubs or organizations: Not on file    Relationship status: Not on file  Other Topics Concern  . Not on file  Social History Narrative   Married, full time, exercise - walks some.    Outpatient Encounter Medications as of 02/16/2018  Medication Sig  . acetaminophen (TYLENOL) 325 MG tablet Take 650 mg by mouth  every 6 (six) hours as needed.  Marland Kitchen amLODipine (NORVASC) 5 MG tablet Take 1 tablet (5 mg total) by mouth daily.  Marland Kitchen aspirin 81 MG tablet Take 81 mg by mouth every other day.   Marland Kitchen atorvastatin (LIPITOR) 20 MG tablet Take 1 tablet (20 mg total) by mouth at bedtime.  . metoprolol succinate (TOPROL-XL) 25 MG 24 hr tablet Take 1 tablet (25 mg total) by mouth daily.  . MULTIPLE VITAMINS-MINERALS ER PO Take by mouth daily.   . naproxen (NAPROSYN) 500 MG tablet Take 1 tablet (500 mg total) by mouth 2 (two) times daily with a meal.   No facility-administered encounter medications on file as of 02/16/2018.     Activities of Daily Living In your present state of health, do you have any difficulty performing the following activities: 02/16/2018 02/20/2017  Hearing? N N  Vision? N N  Difficulty concentrating or making decisions? N N  Walking or climbing stairs? N N  Dressing or bathing? N N  Doing errands, shopping? N N  Preparing Food and eating ? N -  Using the Toilet? N -  In the past six months, have you accidently leaked urine? Y -  Comment Occaionally if waits too long. -  Do you have problems with loss of bowel control? N -  Managing your Medications? N -  Managing your Finances? N -  Housekeeping or managing your Housekeeping? N -  Some recent data might be hidden    Patient Care Team: Birdie Sons, MD as PCP - General (Family Medicine) Pa, [place] as Consulting Physician    Assessment:   This is a routine wellness examination for [name].  Exercise Activities and Dietary recommendations Current Exercise Habits: The patient does not participate in regular exercise at present, Exercise limited by: None identified  Goals    . Exercise 3x per week (30 min per time)     Recommend to start exercising 3 days a week for at least 30 minutes at at time.        Fall Risk Fall Risk  02/20/2017 02/20/2016  Falls in the past year? No No   Is the patient's home free of loose  throw rugs in walkways, pet beds, electrical cords, etc?   yes      Grab bars in the bathroom? no      Handrails on the stairs?   no      Adequate lighting?   yes  Timed Get Up  and Go performed: N/A  Depression Screen PHQ 2/9 Scores 02/16/2018 02/20/2017 02/20/2016  PHQ - 2 Score 0 0 0  PHQ- 9 Score - 1 -     Cognitive Function     6CIT Screen 02/16/2018  What Year? 0 points  What month? 0 points  What time? 0 points  Count back from 20 0 points  Months in reverse 0 points  Repeat phrase 2 points  Total Score 2    Immunization History  Administered Date(s) Administered  . Influenza, High Dose Seasonal PF 08/05/2014, 07/15/2017  . Pneumococcal Conjugate-13 12/23/2013  . Pneumococcal Polysaccharide-23 12/16/2010  . Td 10/06/2007    Qualifies for Shingles Vaccine? Due for Shingles vaccine. Declined my offer to administer today. Education has been provided regarding the importance of this vaccine. Pt has been advised to call her insurance company to determine her out of pocket expense. Advised she may also receive this vaccine at her local pharmacy or Health Dept. Verbalized acceptance and understanding.  Screening Tests Health Maintenance  Topic Date Due  . TETANUS/TDAP  10/05/2017  . INFLUENZA VACCINE  04/29/2018  . MAMMOGRAM  03/06/2019  . DEXA SCAN  01/29/2020  . COLONOSCOPY  05/19/2023  . Hepatitis C Screening  Completed  . PNA vac Low Risk Adult  Completed    Cancer Screenings: Lung: Low Dose CT Chest recommended if Age 37-80 years, 30 pack-year currently smoking OR have quit w/in 15years. Patient does not qualify. Breast:  Up to date on Mammogram? Yes   Up to date of Bone Density/Dexa? Yes Colorectal: Up to date  Additional Screenings:  Hepatitis C Screening: Up to date     Plan:  I have personally reviewed and addressed the Medicare Annual Wellness questionnaire and have noted the following in the patient's chart:  A. Medical and social history B. Use of  alcohol, tobacco or illicit drugs  C. Current medications and supplements D. Functional ability and status E.  Nutritional status F.  Physical activity G. Advance directives H. List of other physicians I.  Hospitalizations, surgeries, and ER visits in previous 12 months J.  [place] such as hearing and vision if needed, cognitive and depression L. Referrals and appointments - none  In addition, I have reviewed and discussed with patient certain preventive protocols, quality metrics, and best practice recommendations. A written personalized care plan for preventive services as well as general preventive health recommendations were provided to patient.  See attached scanned questionnaire for additional information.   Signed,  Fabio Neighbors, LPN Nurse Health Advisor   Nurse Recommendations: Pt declined the tetanus vaccine today.

## 2018-02-25 NOTE — Progress Notes (Signed)
Patient: [name] [name], Female    DOB: October 28, 1945, 72 y.o.   MRN: [id number] Visit Date: 02/26/2018  Today's Provider: Lelon Huh, MD   Chief Complaint  Patient presents with  . Annual Exam  . Hypertension  . Gastroesophageal Reflux  . Hyperlipidemia   Subjective:   Patient saw [facility] for AWV on 02/16/2018.   Complete Physical [name] [name] is a 72 y.o. female. She feels fairly well. She reports exercising daily. She reports she is sleeping fairly well.  -----------------------------------------------------------   Hypertension, follow-up:  BP Readings from Last 3 Encounters:  02/26/18 120/80  02/16/18 132/74  02/20/17 134/74    She was last seen for hypertension 1 years ago.  BP at that visit was 134/74. Management since that visit includes; no changes.She reports good compliance with treatment. She is not having side effects.  She is exercising. She is adherent to low salt diet.   Outside blood pressures are checked at home and average 139/72. She is experiencing palpitations.  Patient denies chest pain, chest pressure/discomfort, claudication, dyspnea, exertional chest pressure/discomfort, fatigue, irregular heart beat, lower extremity edema, near-syncope, orthopnea, paroxysmal nocturnal dyspnea, syncope and tachypnea.   Cardiovascular risk factors include advanced age (older than 32 for men, 75 for women), dyslipidemia and hypertension.  Use of agents associated with hypertension: NSAIDS.   ------------------------------------------------------------------------    Lipid/Cholesterol, Follow-up:   Last seen for this 1 years ago.  Management since that visit includes; labs checked, no changes.  Last Lipid Panel:    Component Value Date/Time   CHOL 199 02/20/2017 1016   TRIG 61 02/20/2017 1016   HDL 73 02/20/2017 1016   CHOLHDL 2.7 02/20/2017 1016   LDLCALC 114 (H) 02/20/2017 1016    She reports good compliance with treatment. She is not  having side effects.   Wt Readings from Last 3 Encounters:  02/26/18 191 lb (86.6 kg)  02/16/18 191 lb 3.2 oz (86.7 kg)  02/20/17 187 lb (84.8 kg)    ------------------------------------------------------------------------   Osteopenia, unspecified location From 02/20/2017-no changes. Will need BMD in 2019.   Review of Systems  Constitutional: Positive for diaphoresis. Negative for chills, fatigue and fever.  HENT: Positive for hearing loss and tinnitus. Negative for congestion, ear pain, rhinorrhea, sneezing and sore throat.   Eyes: Negative.  Negative for pain and redness.  Respiratory: Negative for cough, shortness of breath and wheezing.   Cardiovascular: Positive for palpitations. Negative for chest pain and leg swelling.  Gastrointestinal: Negative for abdominal pain, blood in stool, constipation, diarrhea and nausea.  Endocrine: Negative for polydipsia and polyphagia.  Genitourinary: Negative.  Negative for dysuria, flank pain, hematuria, pelvic pain, vaginal bleeding and vaginal discharge.  Musculoskeletal: Positive for joint swelling. Negative for arthralgias, back pain and gait problem.  Skin: Negative for rash.       Irritated/ hardened skin on right foot  Neurological: Positive for light-headedness. Negative for dizziness, tremors, seizures, weakness, numbness and headaches.  Hematological: Negative for adenopathy. Bruises/bleeds easily.  Psychiatric/Behavioral: Negative.  Negative for behavioral problems, confusion and dysphoric mood. The patient is not nervous/anxious and is not hyperactive.     Social History   Socioeconomic History  . Marital status: Married    Spouse name: [name] [name]   . Number of children: 5  . Years of education: HS  . Highest education level: 12th grade  Occupational History  . Occupation: Semi-Retired  Social Needs  . Financial resource strain: Not hard at all  . Food  insecurity:    Worry: Never true    Inability: Never true  .  Transportation needs:    Medical: No    Non-medical: No  Tobacco Use  . Smoking status: Never Smoker  . Smokeless tobacco: Never Used  . Tobacco comment: tobacco use - no  Substance and Sexual Activity  . Alcohol use: No  . Drug use: No  . Sexual activity: Not on file  Lifestyle  . Physical activity:    Days per week: Not on file    Minutes per session: Not on file  . Stress: Only a little  Relationships  . Social connections:    Talks on phone: Not on file    Gets together: Not on file    Attends religious service: Not on file    Active member of club or organization: Not on file    Attends meetings of clubs or organizations: Not on file    Relationship status: Not on file  . Intimate partner violence:    Fear of current or ex partner: Not on file    Emotionally abused: Not on file    Physically abused: Not on file    Forced sexual activity: Not on file  Other Topics Concern  . Not on file  Social History Narrative   Married, full time, exercise - walks some.    Past Medical History:  Diagnosis Date  . Chest pain, unspecified   . H/O left breast biopsy 12/11/2005   [facility], path report matched the clinical impression of left breast lipoma  . HLD (hyperlipidemia)    mixed  . HTN (hypertension)      Patient Active Problem List   Diagnosis Date Noted  . Osteopenia 02/20/2017  . Plantar fasciitis, right 07/10/2016  . Absolute anemia 02/20/2016  . Colitis 02/20/2016  . Acid reflux 02/20/2016  . Hypercholesteremia 02/20/2016  . BP (high blood pressure) 02/20/2016  . Irregular cardiac rhythm 02/20/2016  . Calculus of kidney 02/20/2016  . Decreased leukocytes 02/20/2016  . Atrophy of thyroid 02/20/2016  . Temporary cerebral vascular dysfunction 02/20/2016  . Neck pain on left side 02/06/2014  . Ankle swelling 02/06/2014  . Asymptomatic PVCs 02/06/2014    Past Surgical History:  Procedure Laterality Date  . ABDOMINAL HYSTERECTOMY  2007   total  . BREAST BIOPSY  Left 2007   Dr. Dwyane Luo office-benign  . COLONOSCOPY  11/12/2001   multiple sessile polyps found in rectum, 90mm in size, distributed diffusely, biopsies were taken. The polyps extended from lower rectum to approximately 20 cm. They were confluent in the area between 10-15cm. Small polyps with central ulceration were noted.    Her family history includes Cancer in her other and unknown relative; Coronary artery disease in her unknown relative; Diabetes in her brother, father, and sister; Healthy in her brother; Heart disease in her brother; Heart disease (age of onset: 77) in her father; Hyperlipidemia in her mother; Hypertension in her brother, mother, and sister; Kidney disease in her sister; Other in her mother; Stroke in her sister. There is no history of Breast cancer.      Current Outpatient Medications:  .  acetaminophen (TYLENOL) 325 MG tablet, Take 650 mg by mouth every 6 (six) hours as needed., Disp: , Rfl:  .  amLODipine (NORVASC) 5 MG tablet, Take 1 tablet (5 mg total) by mouth daily., Disp: 90 tablet, Rfl: 3 .  aspirin 81 MG tablet, Take 81 mg by mouth every other day. , Disp: , Rfl:  .  atorvastatin (LIPITOR) 20 MG tablet, Take 1 tablet (20 mg total) by mouth at bedtime., Disp: 90 tablet, Rfl: 3 .  metoprolol succinate (TOPROL-XL) 25 MG 24 hr tablet, Take 1 tablet (25 mg total) by mouth daily., Disp: 90 tablet, Rfl: 4 .  MULTIPLE VITAMINS-MINERALS ER PO, Take by mouth daily. , Disp: , Rfl:  .  naproxen (NAPROSYN) 500 MG tablet, Take 1 tablet (500 mg total) by mouth 2 (two) times daily with a meal., Disp: 30 tablet, Rfl: 0  Patient Care Team: Birdie Sons, MD as PCP - General (Family Medicine) [facility], [place] as Consulting Physician     Objective:   Vitals: BP 120/80 (BP Location: Left Arm, Patient Position: Sitting, Cuff Size: Large)   Pulse 80   Temp 97.9 F (36.6 C) (Oral)   Resp 16   Ht 5\' 7"  (1.702 m)   Wt 191 lb (86.6 kg)   SpO2 97% Comment: room air   BMI 29.91 kg/m   Physical Exam   General Appearance:    Alert, cooperative, no distress, appears stated age  Head:    Normocephalic, without obvious abnormality, atraumatic  Eyes:    PERRL, conjunctiva/corneas clear, EOM's intact, fundi    benign, both eyes  Ears:    Normal TM's and external ear canals, both ears  Nose:   Nares normal, septum midline, mucosa normal, no drainage    or sinus tenderness  Throat:   Lips, mucosa, and tongue normal; teeth and gums normal  Neck:   Supple, symmetrical, trachea midline, no adenopathy;    thyroid:  no enlargement/tenderness/nodules; no carotid   bruit or JVD  Back:     Symmetric, no curvature, ROM normal, no CVA tenderness  Lungs:     Clear to auscultation bilaterally, respirations unlabored  Chest Wall:    No tenderness or deformity   Heart:    Regular rate and rhythm, S1 and S2 normal, no murmur, rub   or gallop  Breast Exam:    normal appearance, no masses or tenderness  Abdomen:     Soft, non-tender, bowel sounds active all four quadrants,    no masses, no organomegaly  Pelvic:    not indicated; status post hysterectomy, negative ROS  Extremities:   Extremities normal, atraumatic, no cyanosis or edema  Pulses:   2+ and symmetric all extremities  Skin:   Skin color, texture, turgor normal, no rashes. Callus noted plantar aspect of third toe, c/w plantar's wart  Lymph nodes:   Cervical, supraclavicular, and axillary nodes normal  Neurologic:   CNII-XII intact, normal strength, sensation and reflexes    throughout   Activities of Daily Living In your present state of health, do you have any difficulty performing the following activities: 02/16/2018  Hearing? N  Vision? N  Difficulty concentrating or making decisions? N  Walking or climbing stairs? N  Dressing or bathing? N  Doing errands, shopping? N  Preparing Food and eating ? N  Using the Toilet? N  In the past six months, have you accidently leaked urine? Y  Comment  Occaionally if waits too long.  Do you have problems with loss of bowel control? N  Managing your Medications? N  Managing your Finances? N  Housekeeping or managing your Housekeeping? N  Some recent data might be hidden    Fall Risk Assessment Fall Risk  02/26/2018 02/20/2017 02/20/2016  Falls in the past year? No No No     Depression Screen PHQ 2/9 Scores  02/16/2018 02/20/2017 02/20/2016  PHQ - 2 Score 0 0 0  PHQ- 9 Score - 1 -   Audit-C Alcohol Use Screening  Question Answer Points  How often do you have alcoholi c drink? Never 0  How many drinks do you typically consume in a day? Never 0  How oftey will you drink 6 or more in a total? never 0  Total Score:  0   A score of 3 or more in women, and 4 or more in men indicates increased risk for alcohol abuse, EXCEPT if all of the points are from question 1.     Assessment & Plan:    Annual Physical Reviewed patient's Family Medical History Reviewed and updated list of patient's medical providers Assessment of cognitive impairment was done Assessed patient's functional ability Established a written schedule for health screening [place] Completed and Reviewed  Exercise Activities and Dietary recommendations Goals    . Exercise 3x per week (30 min per time)     Recommend to start exercising 3 days a week for at least 30 minutes at at time.        Immunization History  Administered Date(s) Administered  . Influenza, High Dose Seasonal PF 08/05/2014, 07/15/2017  . Pneumococcal Conjugate-13 12/23/2013  . Pneumococcal Polysaccharide-23 12/16/2010  . Td 10/06/2007    Health Maintenance  Topic Date Due  . TETANUS/TDAP  10/05/2017  . DEXA SCAN  01/28/2018  . INFLUENZA VACCINE  04/29/2018  . MAMMOGRAM  03/06/2019  . COLONOSCOPY  05/19/2023  . Hepatitis C Screening  Completed  . PNA vac Low Risk Adult  Completed     Discussed health benefits of physical activity, and encouraged her to engage  in regular exercise appropriate for her age and condition.    ------------------------------------------------------------------------------------------------------------  1. Annual physical exam   2. Asymptomatic PVCs  - Magnesium  3. Hypercholesteremia She is tolerating atorvastatin well with no adverse effects.   - Comprehensive metabolic panel - Lipid panel  4. Essential hypertension Well controlled.  Continue current medications.   - Comprehensive metabolic panel - EKG [id number]  5. Osteopenia, unspecified location Due for- DG Bone Density; Future  6. Estrogen deficiency  - DG Bone Density; Future     Lelon Huh, MD  [facility]

## 2018-02-26 ENCOUNTER — Ambulatory Visit (INDEPENDENT_AMBULATORY_CARE_PROVIDER_SITE_OTHER): Payer: Medicare HMO | Admitting: Family Medicine

## 2018-02-26 ENCOUNTER — Encounter: Payer: Self-pay | Admitting: Family Medicine

## 2018-02-26 VITALS — BP 120/80 | HR 80 | Temp 97.9°F | Resp 16 | Ht 67.0 in | Wt 191.0 lb

## 2018-02-26 DIAGNOSIS — Z0000 Encounter for general adult medical examination without abnormal findings: Secondary | ICD-10-CM

## 2018-02-26 DIAGNOSIS — E7800 Pure hypercholesterolemia, unspecified: Secondary | ICD-10-CM

## 2018-02-26 DIAGNOSIS — M8580 Other specified disorders of bone density and structure, unspecified site: Secondary | ICD-10-CM

## 2018-02-26 DIAGNOSIS — I493 Ventricular premature depolarization: Secondary | ICD-10-CM

## 2018-02-26 DIAGNOSIS — E2839 Other primary ovarian failure: Secondary | ICD-10-CM

## 2018-02-26 DIAGNOSIS — I10 Essential (primary) hypertension: Secondary | ICD-10-CM | POA: Diagnosis not present

## 2018-02-26 NOTE — Patient Instructions (Addendum)
   The CDC recommends two doses of Shingrix (the shingles vaccine) separated by 2 to 6 months for adults age 73 years and older. I recommend checking with your pharmacy plan regarding coverage for this vaccine.    You are alsow due for a tetanus vaccine (Td)

## 2018-02-27 LAB — COMPREHENSIVE METABOLIC PANEL
ALT: 23 IU/L (ref 0–32)
AST: 20 IU/L (ref 0–40)
Albumin/Globulin Ratio: 1.4 (ref 1.2–2.2)
Albumin: 4 g/dL (ref 3.5–4.8)
Alkaline Phosphatase: 92 IU/L (ref 39–117)
BUN/Creatinine Ratio: 18 (ref 12–28)
BUN: 13 mg/dL (ref 8–27)
Bilirubin Total: 0.3 mg/dL (ref 0.0–1.2)
CALCIUM: 9.3 mg/dL (ref 8.7–10.3)
CO2: 23 mmol/L (ref 20–29)
CREATININE: 0.71 mg/dL (ref 0.57–1.00)
Chloride: 105 mmol/L (ref 96–106)
GFR calc Af Amer: 98 mL/min/{1.73_m2} (ref >59)
GFR, EST NON AFRICAN AMERICAN: 85 mL/min/{1.73_m2} (ref >59)
Globulin, Total: 2.9 g/dL (ref 1.5–4.5)
Glucose: 84 mg/dL (ref 65–99)
Potassium: 4 mmol/L (ref 3.5–5.2)
Sodium: 143 mmol/L (ref 134–144)
TOTAL PROTEIN: 6.9 g/dL (ref 6.0–8.5)

## 2018-02-27 LAB — LIPID PANEL
CHOL/HDL RATIO: 2.6 ratio (ref 0.0–4.4)
Cholesterol, Total: 193 mg/dL (ref 100–199)
HDL: 75 mg/dL (ref >39)
LDL CALC: 107 mg/dL — AB (ref 0–99)
Triglycerides: 57 mg/dL (ref 0–149)
VLDL CHOLESTEROL CAL: 11 mg/dL (ref 5–40)

## 2018-02-27 LAB — MAGNESIUM: Magnesium: 2.2 mg/dL (ref 1.6–2.3)

## 2018-03-04 ENCOUNTER — Other Ambulatory Visit: Payer: Self-pay | Admitting: Family Medicine

## 2018-03-04 DIAGNOSIS — Z1231 Encounter for screening mammogram for malignant neoplasm of breast: Secondary | ICD-10-CM

## 2018-03-07 IMAGING — MG MM DIGITAL SCREENING BILAT W/ TOMO W/ CAD
8 of 13 series · 8 of 29 positions shown · non-contrast
Comparison: Previous exam(s).

CLINICAL DATA: Screening.

EXAM:
2D DIGITAL SCREENING BILATERAL MAMMOGRAM WITH CAD AND ADJUNCT TOMO

[R MLO (1 of 2)]
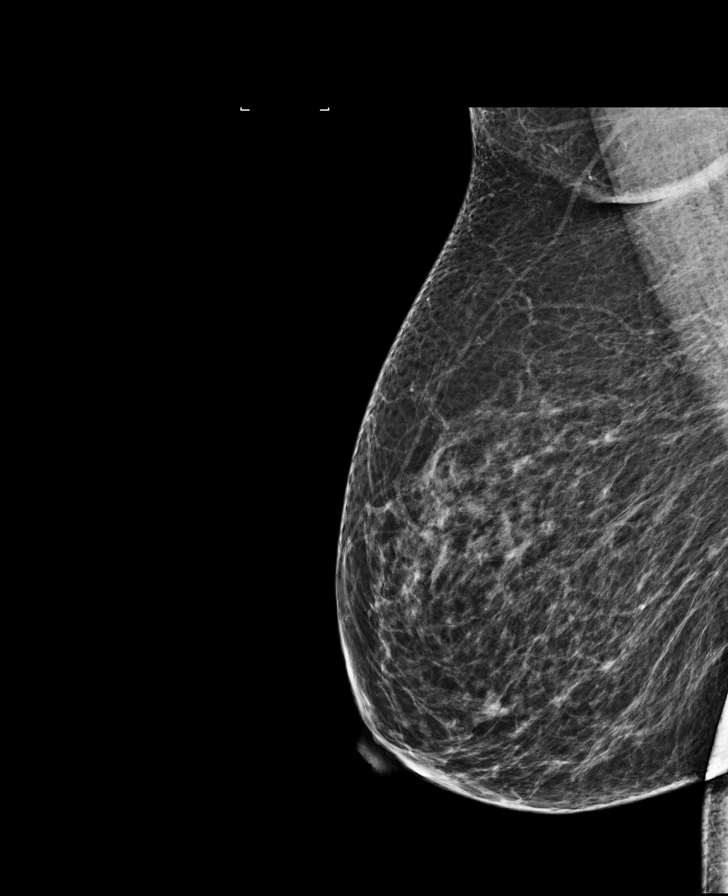

[R CC synth-2D]
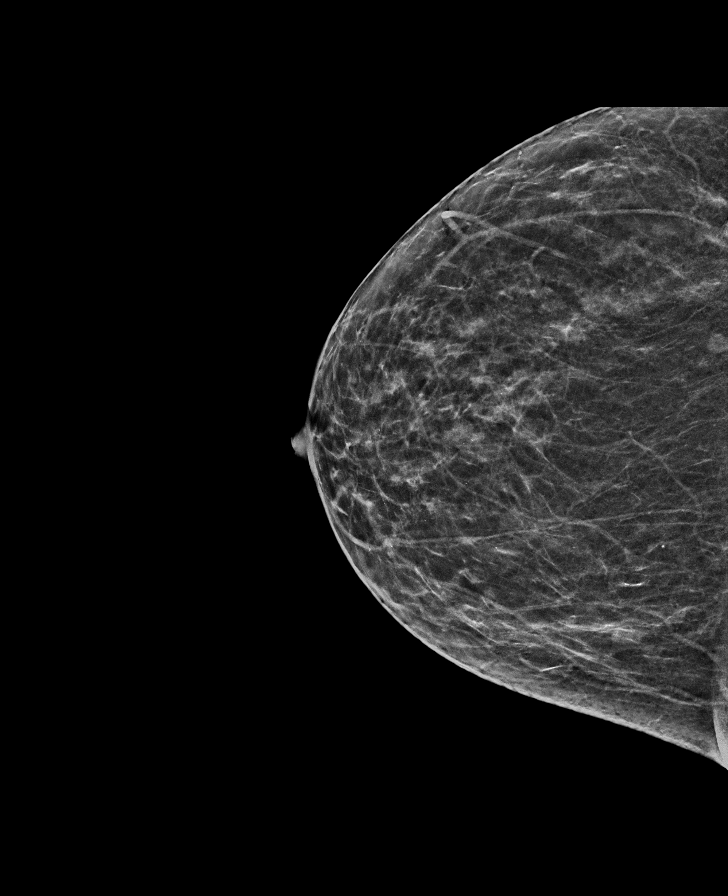

[L MLO synth-2D]
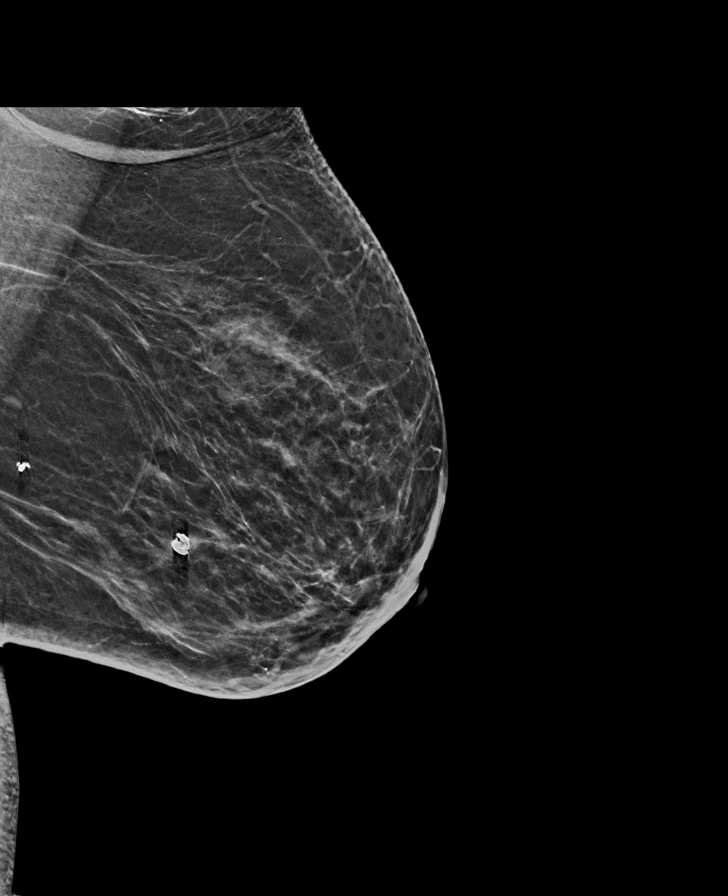

[R CC]
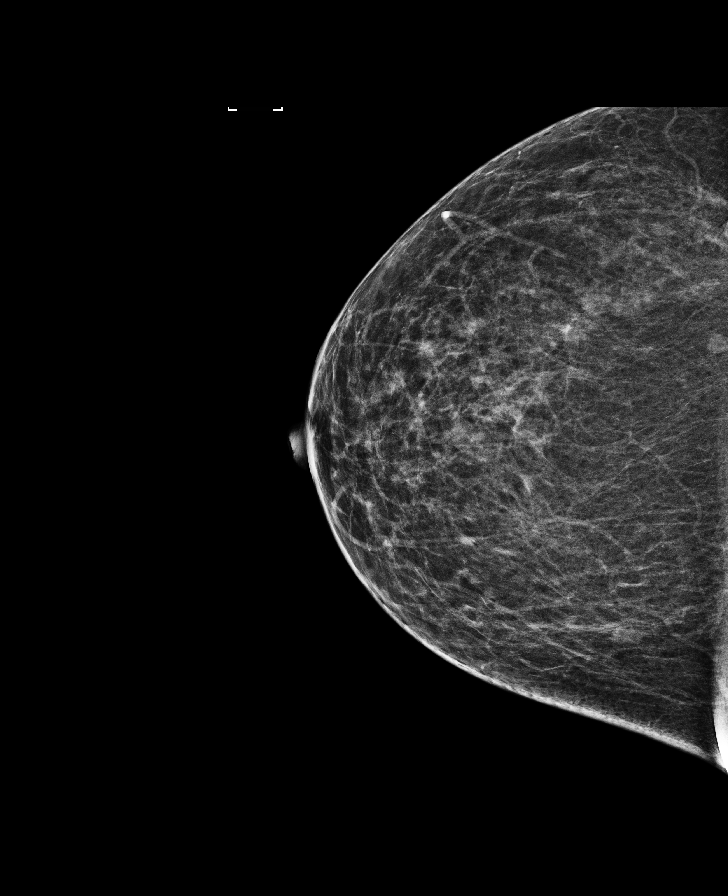

[R MLO (2 of 2)]
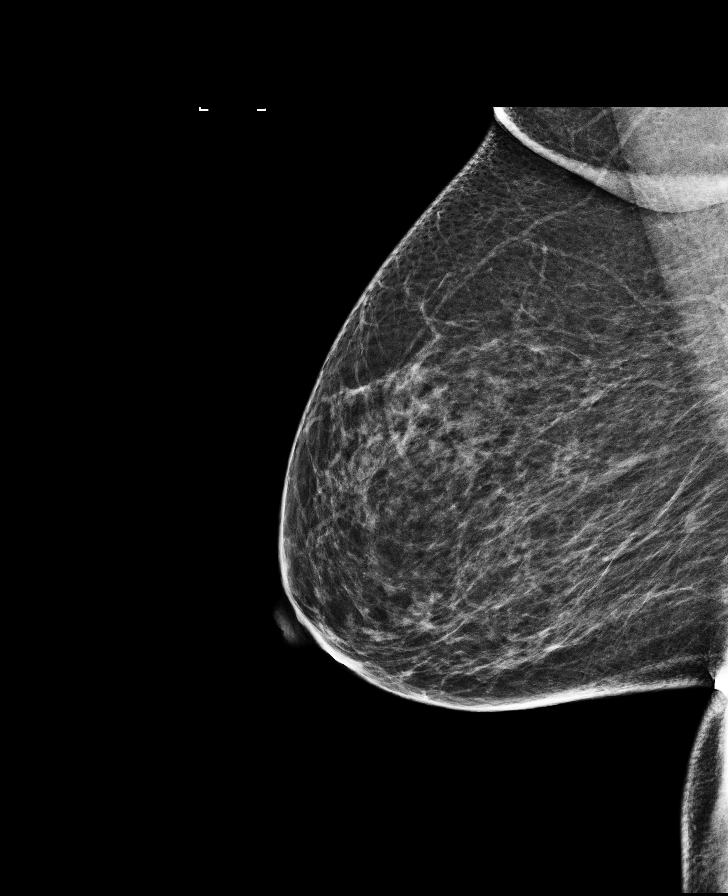

[L CC]
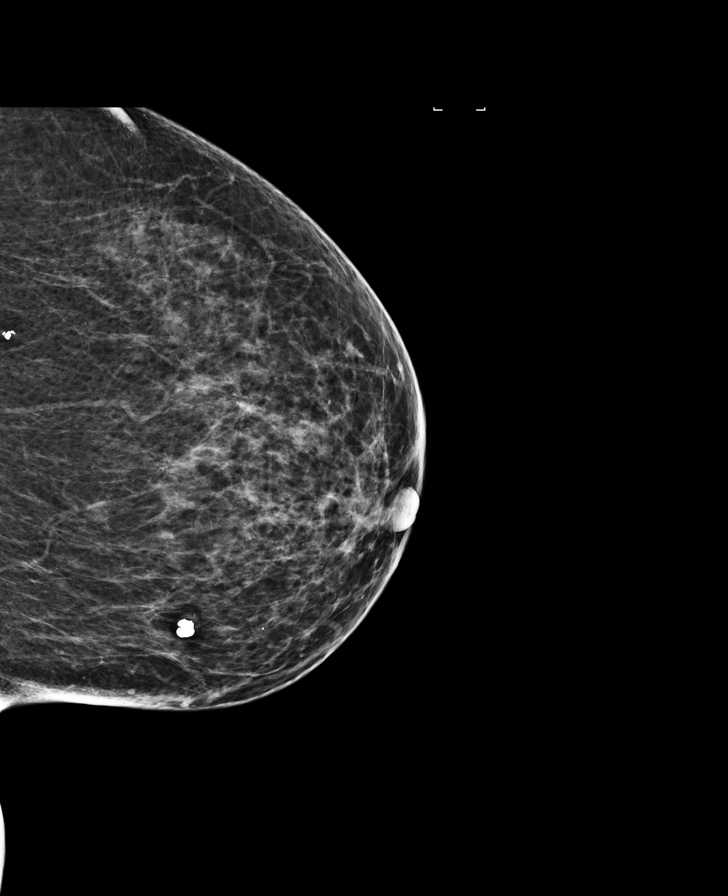

[L CC synth-2D]
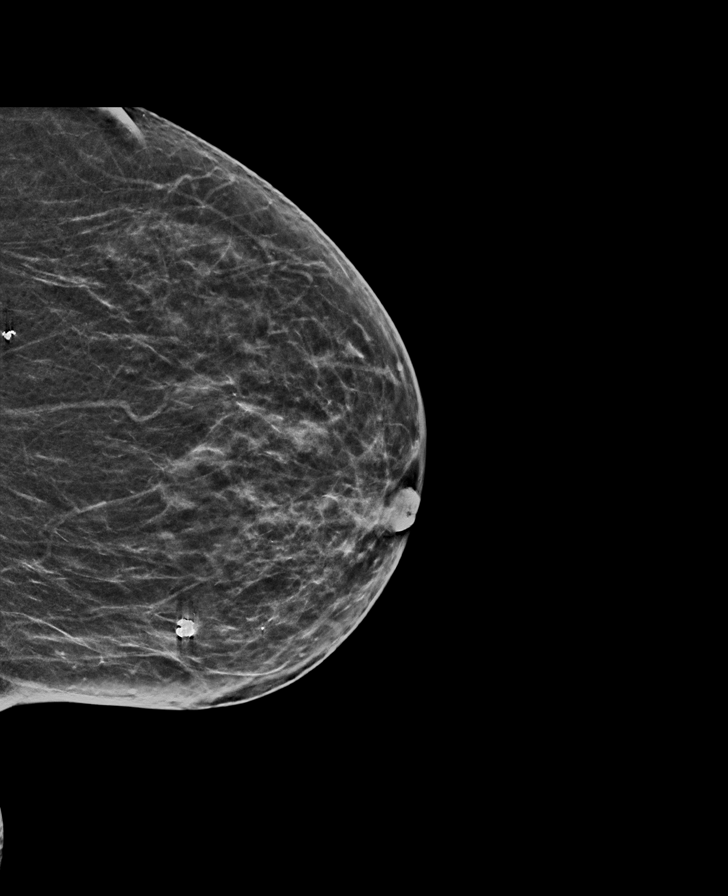

[L MLO]
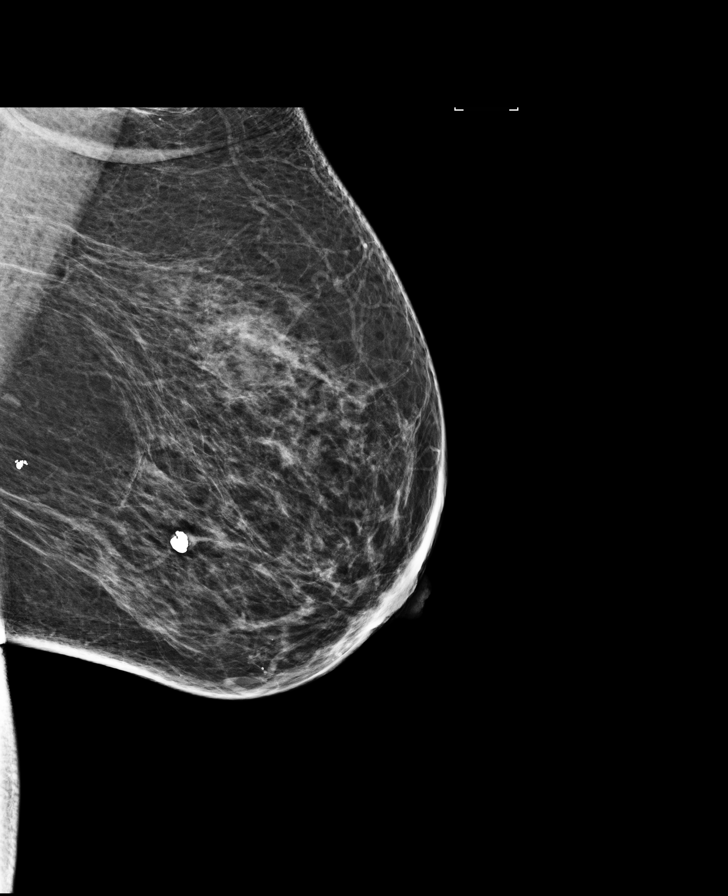

[8 of 29 positions shown; findings below may reference images not displayed]

ACR Breast Density Category b: There are scattered areas of
fibroglandular density.
FINDINGS: There are no findings suspicious for malignancy. Images were
processed with CAD.
IMPRESSION: No mammographic evidence of malignancy. A result letter of this
screening mammogram will be mailed directly to the patient.

RECOMMENDATION:
Screening mammogram in one year. (Code:97-6-RS4)

BI-RADS CATEGORY  1: Negative.

## 2018-03-09 ENCOUNTER — Other Ambulatory Visit: Payer: Self-pay | Admitting: Family Medicine

## 2018-03-09 DIAGNOSIS — E7800 Pure hypercholesterolemia, unspecified: Secondary | ICD-10-CM

## 2018-03-09 DIAGNOSIS — I10 Essential (primary) hypertension: Secondary | ICD-10-CM

## 2018-03-26 ENCOUNTER — Ambulatory Visit
Admission: RE | Admit: 2018-03-26 | Discharge: 2018-03-26 | Disposition: A | Discharge status: Home / Self Care | Payer: Medicare HMO | Source: Ambulatory Visit | Attending: Family Medicine | Admitting: Family Medicine

## 2018-03-26 DIAGNOSIS — Z1231 Encounter for screening mammogram for malignant neoplasm of breast: Secondary | ICD-10-CM | POA: Insufficient documentation

## 2018-03-26 DIAGNOSIS — Z780 Asymptomatic menopausal state: Secondary | ICD-10-CM | POA: Diagnosis not present

## 2018-03-26 DIAGNOSIS — M8580 Other specified disorders of bone density and structure, unspecified site: Secondary | ICD-10-CM

## 2018-03-26 DIAGNOSIS — E2839 Other primary ovarian failure: Secondary | ICD-10-CM | POA: Insufficient documentation

## 2018-03-26 DIAGNOSIS — M85851 Other specified disorders of bone density and structure, right thigh: Secondary | ICD-10-CM | POA: Diagnosis not present

## 2018-05-27 ENCOUNTER — Ambulatory Visit: Payer: Medicare Other | Admitting: General Surgery

## 2018-06-29 ENCOUNTER — Encounter: Payer: Self-pay | Admitting: General Surgery

## 2018-06-29 ENCOUNTER — Ambulatory Visit: Payer: Medicare HMO | Admitting: General Surgery

## 2018-06-29 VITALS — BP 132/74 | HR 74 | Ht 67.0 in | Wt 183.0 lb

## 2018-06-29 DIAGNOSIS — Z86010 Personal history of colonic polyps: Secondary | ICD-10-CM | POA: Diagnosis not present

## 2018-06-29 MED ORDER — POLYETHYLENE GLYCOL 3350 17 GM/SCOOP PO POWD: 1.0000 | Freq: Once | ORAL | 0 refills | Status: AC

## 2018-06-29 NOTE — Progress Notes (Signed)
Patient ID: [name] [name], female   DOB: March 13, 1946, 72 y.o.   MRN: [id number]  Chief Complaint  Patient presents with  . Colonoscopy    HPI [name] [name] is a 72 y.o. female here today for a evaluation of a colonoscopy. Last colonoscopy 05/18/2013. Moves her bowels daily. No GI problems at this time. Moves bowels daily.  HPI  Past Medical History:  Diagnosis Date  . Chest pain, unspecified   . H/O left breast biopsy 12/11/2005   [facility], path report matched the clinical impression of left breast lipoma  . HLD (hyperlipidemia)    mixed  . HTN (hypertension)     Past Surgical History:  Procedure Laterality Date  . ABDOMINAL HYSTERECTOMY  2007   total  . BREAST BIOPSY Left 2007   Dr. Dwyane Luo office-benign  . COLONOSCOPY  11/12/2001   multiple sessile polyps found in rectum, 49mm in size, distributed diffusely, biopsies were taken. The polyps extended from lower rectum to approximately 20 cm. They were confluent in the area between 10-15cm. Small polyps with central ulceration were noted.    Family History  Problem Relation Age of Onset  . Cancer Other        ovarian, breast, colon cancers  . Hypertension Mother   . Hyperlipidemia Mother   . Other Mother        enlarged heart  . Heart disease Father 35  . Diabetes Father   . Diabetes Sister   . Hypertension Sister   . Kidney disease Sister   . Stroke Sister   . Heart disease Brother   . Hypertension Brother   . Diabetes Brother   . Healthy Brother   . Cancer Unknown        family hx  . Coronary artery disease Unknown        family hx  . Breast cancer Neg Hx     Social History Social History   Tobacco Use  . Smoking status: Never Smoker  . Smokeless tobacco: Never Used  . Tobacco comment: tobacco use - no  Substance Use Topics  . Alcohol use: No  . Drug use: No    No Known Allergies  Current Outpatient Medications  Medication Sig Dispense Refill  . amLODipine (NORVASC) 5 MG tablet TAKE 1 TABLET EVERY DAY 90  tablet 4  . aspirin 81 MG tablet Take 81 mg by mouth every other day.     Marland Kitchen atorvastatin (LIPITOR) 20 MG tablet TAKE 1 TABLET AT BEDTIME 90 tablet 4  . metoprolol succinate (TOPROL-XL) 25 MG 24 hr tablet Take 1 tablet (25 mg total) by mouth daily. 90 tablet 4  . MULTIPLE VITAMINS-MINERALS ER PO Take by mouth daily.      No current facility-administered medications for this visit.     Review of Systems Review of Systems  Blood pressure 132/74, pulse 74, height 5\' 7"  (1.702 m), weight 183 lb (83 kg).  Physical Exam Physical Exam  Constitutional: She is oriented to person, place, and time. She appears well-developed and well-nourished.  Eyes: Conjunctivae are normal. No scleral icterus.  Neck: Normal range of motion.  Cardiovascular: Normal rate, regular rhythm and normal heart sounds.  Pulmonary/Chest: Effort normal and breath sounds normal.  Lymphadenopathy:    She has no cervical adenopathy.  Neurological: She is alert and oriented to person, place, and time.  Skin: Skin is warm and dry.    Data Reviewed August 2014 colonoscopy report and pathology reviewed.  3 mm tubular adenoma of  the ascending colon.    Assessment    Candidate for repeat colonoscopy.    Plan   Colonoscopy with possible biopsy/polypectomy prn: Information regarding the procedure, including its potential risks and complications (including but not limited to perforation of the bowel, which may require emergency surgery to repair, and bleeding) was verbally given to the patient. Educational information regarding lower intestinal endoscopy was given to the patient. Written instructions for how to complete the bowel prep using Miralax were provided. The importance of drinking ample fluids to avoid dehydration as a result of the prep emphasized.  HPI, Physical Exam, Assessment and Plan have been scribed under the direction and in the presence of [name] Ard, MD.  [name] [name], CMA  I have completed the  exam and reviewed the above documentation for accuracy and completeness.  I agree with the above.  Haematologist has been used and any errors in dictation or transcription are unintentional.  [name] [name], M.D., F.A.C.S.  The patient is scheduled for a Colonoscopy at [facility] on 08/04/18. They are aware to call the day before to get their arrival time. She may continue her 81 mg Aspirin. She will only take her Metoprolol at 6 am with a sip of water the morning of. Miralax prescription has been sent into the patient's pharmacy. The patient is aware of date and instructions.  Documented by [name] [name]   [name] [name] 06/30/2018, 8:14 AM

## 2018-06-29 NOTE — Patient Instructions (Addendum)
The patient is scheduled for a Colonoscopy at [facility] on 08/04/18. They are aware to call the day before to get their arrival time. She may continue her 81 mg Aspirin. She will only take her Metoprolol at 6 am with a sip of water the morning of. Miralax prescription has been sent into the patient's pharmacy. The patient is aware of date and instructions.     Colonoscopy, Adult A colonoscopy is an exam to look at the entire large intestine. During the exam, a lubricated, bendable tube is inserted into the anus and then passed into the rectum, colon, and other parts of the large intestine. A colonoscopy is often done as a part of normal colorectal screening or in response to certain symptoms, such as anemia, persistent diarrhea, abdominal pain, and blood in the stool. The exam can help screen for and diagnose medical problems, including:  Tumors.  Polyps.  Inflammation.  Areas of bleeding.  Tell a health care provider about:  Any allergies you have.  All medicines you are taking, including vitamins, herbs, eye drops, creams, and over-the-counter medicines.  Any problems you or family members have had with anesthetic medicines.  Any blood disorders you have.  Any surgeries you have had.  Any medical conditions you have.  Any problems you have had passing stool. What are the risks? Generally, this is a safe procedure. However, problems may occur, including:  Bleeding.  A tear in the intestine.  A reaction to medicines given during the exam.  Infection (rare).  What happens before the procedure? Eating and drinking restrictions Follow instructions from your health care provider about eating and drinking, which may include:  A few days before the procedure - follow a low-fiber diet. Avoid nuts, seeds, dried fruit, raw fruits, and vegetables.  1-3 days before the procedure - follow a clear liquid diet. Drink only clear liquids, such as clear broth or bouillon, black coffee or  tea, clear juice, clear soft drinks or sports drinks, gelatin dessert, and popsicles. Avoid any liquids that contain red or purple dye.  On the day of the procedure - do not eat or drink anything during the 2 hours before the procedure, or within the time period that your health care provider recommends.  Bowel prep If you were prescribed an oral bowel prep to clean out your colon:  Take it as told by your health care provider. Starting the day before your procedure, you will need to drink a large amount of medicated liquid. The liquid will cause you to have multiple loose stools until your stool is almost clear or light green.  If your skin or anus gets irritated from diarrhea, you may use these to relieve the irritation: ? Medicated wipes, such as adult wet wipes with aloe and vitamin E. ? A skin soothing-product like petroleum jelly.  If you vomit while drinking the bowel prep, take a break for up to 60 minutes and then begin the bowel prep again. If vomiting continues and you cannot take the bowel prep without vomiting, call your health care provider.  General instructions  Ask your health care provider about changing or stopping your regular medicines. This is especially important if you are taking diabetes medicines or blood thinners.  Plan to have someone take you home from the hospital or clinic. What happens during the procedure?  An IV tube may be inserted into one of your veins.  You will be given medicine to help you relax (sedative).  To reduce your  risk of infection: ? Your health care team will wash or sanitize their hands. ? Your anal area will be washed with soap.  You will be asked to lie on your side with your knees bent.  Your health care provider will lubricate a long, thin, flexible tube. The tube will have a camera and a light on the end.  The tube will be inserted into your anus.  The tube will be gently eased through your rectum and colon.  Air will be  delivered into your colon to keep it open. You may feel some pressure or cramping.  The camera will be used to take images during the procedure.  A small tissue sample may be removed from your body to be examined under a microscope (biopsy). If any potential problems are found, the tissue will be sent to a lab for testing.  If small polyps are found, your health care provider may remove them and have them checked for cancer cells.  The tube that was inserted into your anus will be slowly removed. The procedure may vary among health care providers and hospitals. What happens after the procedure?  Your blood pressure, heart rate, breathing rate, and blood oxygen level will be monitored until the medicines you were given have worn off.  Do not drive for 24 hours after the exam.  You may have a small amount of blood in your stool.  You may pass gas and have mild abdominal cramping or bloating due to the air that was used to inflate your colon during the exam.  It is up to you to get the results of your procedure. Ask your health care provider, or the department performing the procedure, when your results will be ready. This information is not intended to replace advice given to you by your health care provider. Make sure you discuss any questions you have with your health care provider. Document Released: 09/12/2000 Document Revised: 07/16/2016 Document Reviewed: 11/27/2015 Elsevier Interactive Patient Education  2018 [employer].

## 2018-06-30 DIAGNOSIS — Z86010 Personal history of colonic polyps: Secondary | ICD-10-CM | POA: Insufficient documentation

## 2018-07-22 DIAGNOSIS — H524 Presbyopia: Secondary | ICD-10-CM | POA: Diagnosis not present

## 2018-08-03 ENCOUNTER — Encounter: Payer: Self-pay | Admitting: *Deleted

## 2018-08-04 ENCOUNTER — Encounter
Admission: RE | Disposition: A | Discharge status: Home / Self Care | Payer: Self-pay | Source: Ambulatory Visit | Attending: General Surgery

## 2018-08-04 ENCOUNTER — Ambulatory Visit: Payer: Medicare HMO | Admitting: Anesthesiology

## 2018-08-04 ENCOUNTER — Ambulatory Visit
Admission: RE | Admit: 2018-08-04 | Discharge: 2018-08-04 | Disposition: A | Discharge status: Home / Self Care | Payer: Medicare HMO | Source: Ambulatory Visit | Attending: General Surgery | Admitting: General Surgery

## 2018-08-04 DIAGNOSIS — I10 Essential (primary) hypertension: Secondary | ICD-10-CM | POA: Insufficient documentation

## 2018-08-04 DIAGNOSIS — Z86010 Personal history of colonic polyps: Secondary | ICD-10-CM | POA: Diagnosis not present

## 2018-08-04 DIAGNOSIS — Z1211 Encounter for screening for malignant neoplasm of colon: Secondary | ICD-10-CM | POA: Insufficient documentation

## 2018-08-04 DIAGNOSIS — Z7982 Long term (current) use of aspirin: Secondary | ICD-10-CM | POA: Diagnosis not present

## 2018-08-04 DIAGNOSIS — K5730 Diverticulosis of large intestine without perforation or abscess without bleeding: Secondary | ICD-10-CM | POA: Insufficient documentation

## 2018-08-04 DIAGNOSIS — E782 Mixed hyperlipidemia: Secondary | ICD-10-CM | POA: Insufficient documentation

## 2018-08-04 DIAGNOSIS — D123 Benign neoplasm of transverse colon: Secondary | ICD-10-CM | POA: Diagnosis not present

## 2018-08-04 DIAGNOSIS — K5790 Diverticulosis of intestine, part unspecified, without perforation or abscess without bleeding: Secondary | ICD-10-CM | POA: Diagnosis not present

## 2018-08-04 DIAGNOSIS — Z79899 Other long term (current) drug therapy: Secondary | ICD-10-CM | POA: Insufficient documentation

## 2018-08-04 DIAGNOSIS — K635 Polyp of colon: Secondary | ICD-10-CM | POA: Diagnosis not present

## 2018-08-04 HISTORY — PX: COLONOSCOPY WITH PROPOFOL: SHX5780

## 2018-08-04 SURGERY — COLONOSCOPY WITH PROPOFOL
Anesthesia: General

## 2018-08-04 MED ORDER — PROPOFOL 500 MG/50ML IV EMUL
INTRAVENOUS | Status: DC | PRN
  Administered 2018-08-04: 125 ug/kg/min via INTRAVENOUS

## 2018-08-04 MED ORDER — SODIUM CHLORIDE 0.9 % IV SOLN
INTRAVENOUS | Status: DC
  Administered 2018-08-04: 1000 mL via INTRAVENOUS

## 2018-08-04 MED ORDER — PROPOFOL 500 MG/50ML IV EMUL
INTRAVENOUS | Status: AC
  Filled 2018-08-04: qty 50

## 2018-08-04 MED ORDER — LIDOCAINE HCL (PF) 2 % IJ SOLN
INTRAMUSCULAR | Status: AC
  Filled 2018-08-04: qty 10

## 2018-08-04 MED ORDER — PROPOFOL 10 MG/ML IV BOLUS
INTRAVENOUS | Status: DC | PRN
  Administered 2018-08-04: 100 mg via INTRAVENOUS

## 2018-08-04 MED ORDER — PROPOFOL 10 MG/ML IV BOLUS
INTRAVENOUS | Status: AC
  Filled 2018-08-04: qty 20

## 2018-08-04 MED ORDER — LIDOCAINE HCL (CARDIAC) PF 100 MG/5ML IV SOSY
PREFILLED_SYRINGE | INTRAVENOUS | Status: DC | PRN
  Administered 2018-08-04: 100 mg via INTRAVENOUS

## 2018-08-04 NOTE — H&P (Signed)
[name] [name] [id number] 06-13-1946     HPI:  72 y.o woman s/p removal of a small tubular adenoma from the right colon in 2014. For follow up exam. Tolerated prep well.   Medications Prior to Admission  Medication Sig Dispense Refill Last Dose  . metoprolol succinate (TOPROL-XL) 25 MG 24 hr tablet Take 1 tablet (25 mg total) by mouth daily. 90 tablet 4 08/04/2018 at Unknown time  . amLODipine (NORVASC) 5 MG tablet TAKE 1 TABLET EVERY DAY 90 tablet 4 Taking  . aspirin 81 MG tablet Take 81 mg by mouth every other day.    Taking  . atorvastatin (LIPITOR) 20 MG tablet TAKE 1 TABLET AT BEDTIME 90 tablet 4 Taking  . MULTIPLE VITAMINS-MINERALS ER PO Take by mouth daily.    Taking   No Known Allergies Past Medical History:  Diagnosis Date  . Chest pain, unspecified   . H/O left breast biopsy 12/11/2005   [facility], path report matched the clinical impression of left breast lipoma  . HLD (hyperlipidemia)    mixed  . HTN (hypertension)    Past Surgical History:  Procedure Laterality Date  . ABDOMINAL HYSTERECTOMY  2007   total  . BREAST BIOPSY Left 2007   Dr. Dwyane Luo office-benign  . COLONOSCOPY  11/12/2001   multiple sessile polyps found in rectum, 24mm in size, distributed diffusely, biopsies were taken. The polyps extended from lower rectum to approximately 20 cm. They were confluent in the area between 10-15cm. Small polyps with central ulceration were noted.   Social History   Socioeconomic History  . Marital status: Married    Spouse name: Herbie Baltimore   . Number of children: 5  . Years of education: HS  . Highest education level: 12th grade  Occupational History  . Occupation: Semi-Retired  Social Needs  . Financial resource strain: Not hard at all  . Food insecurity:    Worry: Never true    Inability: Never true  . Transportation needs:    Medical: No    Non-medical: No  Tobacco Use  . Smoking status: Never Smoker  . Smokeless tobacco: Never Used  . Tobacco comment: tobacco use  - no  Substance and Sexual Activity  . Alcohol use: No  . Drug use: No  . Sexual activity: Not on file  Lifestyle  . Physical activity:    Days per week: Not on file    Minutes per session: Not on file  . Stress: Only a little  Relationships  . Social connections:    Talks on phone: Not on file    Gets together: Not on file    Attends religious service: Not on file    Active member of club or organization: Not on file    Attends meetings of clubs or organizations: Not on file    Relationship status: Not on file  . Intimate partner violence:    Fear of current or ex partner: Not on file    Emotionally abused: Not on file    Physically abused: Not on file    Forced sexual activity: Not on file  Other Topics Concern  . Not on file  Social History Narrative   Married, full time, exercise - walks some.   Social History   Social History Narrative   Married, full time, exercise - walks some.     ROS: Negative.     PE: HEENT: Negative. Lungs: Clear. Cardio: RR.  Assessment/Plan:  Proceed with planned endoscopy.  Robert Bellow  08/04/2018     

## 2018-08-04 NOTE — Transfer of Care (Signed)
Immediate Anesthesia Transfer of Care Note  Patient: [name] [name]  Procedure(s) Performed: COLONOSCOPY WITH PROPOFOL (N/A )  Patient Location: PACU and Endoscopy Unit  Anesthesia Type:General  Level of Consciousness: sedated  Airway & Oxygen Therapy: Patient Spontanous Breathing  Post-op Assessment: Report given to RN  Post vital signs: stable  Last Vitals:  Vitals Value Taken Time  BP 106/67 08/04/2018  9:25 AM  Temp 35.8 C 08/04/2018  9:20 AM  Pulse 69 08/04/2018  9:26 AM  Resp 25 08/04/2018  9:26 AM  SpO2 99 % 08/04/2018  9:26 AM  Vitals shown include unvalidated device data.  Last Pain:  Vitals:   08/04/18 0920  TempSrc: Tympanic  PainSc:          Complications: No apparent anesthesia complications

## 2018-08-04 NOTE — Anesthesia Preprocedure Evaluation (Addendum)
Anesthesia Evaluation  Patient identified by MRN, date of birth, ID band Patient awake    Reviewed: Allergy & Precautions, H&P , NPO status , reviewed documented beta blocker date and time   Airway Mallampati: II  TM Distance: >3 FB     Dental  (+) Caps   Pulmonary    Pulmonary exam normal        Cardiovascular hypertension, Normal cardiovascular exam     Neuro/Psych    GI/Hepatic GERD  Controlled,  Endo/Other    Renal/GU Renal disease     Musculoskeletal   Abdominal   Peds  Hematology   Anesthesia Other Findings Past Medical History: No date: Chest pain, unspecified 12/11/2005: H/O left breast biopsy     Comment:  [facility], path report matched the clinical impression of               left breast lipoma No date: HLD (hyperlipidemia)     Comment:  mixed No date: HTN (hypertension)  Past Surgical History: 2007: ABDOMINAL HYSTERECTOMY     Comment:  total 2007: BREAST BIOPSY; Left     Comment:  Dr. Dwyane Luo office-benign 11/12/2001: COLONOSCOPY     Comment:  multiple sessile polyps found in rectum, 78mm in size,               distributed diffusely, biopsies were taken. The polyps               extended from lower rectum to approximately 20 cm. They               were confluent in the area between 10-15cm. Small polyps               with central ulceration were noted.     Reproductive/Obstetrics                            Anesthesia Physical Anesthesia Plan  ASA: II  Anesthesia Plan: General   Post-op Pain Management:    Induction: Intravenous  PONV Risk Score and Plan: 3 and Treatment may vary due to age or medical condition and TIVA  Airway Management Planned: Nasal Cannula and Natural Airway  Additional Equipment:   Intra-op Plan:   Post-operative Plan:   Informed Consent: I have reviewed the patients History and Physical, chart, labs and discussed the procedure  including the risks, benefits and alternatives for the proposed anesthesia with the patient or authorized representative who has indicated his/her understanding and acceptance.   Dental Advisory Given  Plan Discussed with:   Anesthesia Plan Comments:        Anesthesia Quick Evaluation

## 2018-08-04 NOTE — Anesthesia Post-op Follow-up Note (Signed)
Anesthesia QCDR form completed.        

## 2018-08-04 NOTE — Op Note (Signed)
[facility] Patient Name: [name] [name] Procedure Date: 08/04/2018 7:10 AM MRN: [id number] Account #: [id number] Date of Birth: 1946-04-01 Admit Type: Outpatient Age: 72 Room: [facility] ENDO ROOM 1 Gender: Female Note Status: Finalized Procedure:            Colonoscopy Indications:          High risk colon cancer surveillance: Personal history                        of colonic polyps Providers:            Robert Bellow, MD Referring MD:         Kirstie Peri. Caryn Section, MD (Referring MD) Medicines:            Monitored Anesthesia Care Complications:        No immediate complications. Procedure:            Pre-Anesthesia Assessment:                       - Prior to the procedure, a History and Physical was                        performed, and patient medications, allergies and                        sensitivities were reviewed. The patient's tolerance of                        previous anesthesia was reviewed.                       - The risks and benefits of the procedure and the                        sedation options and risks were discussed with the                        patient. All questions were answered and informed                        consent was obtained.                       After obtaining informed consent, the colonoscope was                        passed under direct vision. Throughout the procedure,                        the patient's blood pressure, pulse, and oxygen                        saturations were monitored continuously. The                        Colonoscope was introduced through the anus and                        advanced to the the cecum, identified by appendiceal  orifice and ileocecal valve. The colonoscopy was                        performed without difficulty. The patient tolerated the                        procedure well. The quality of the bowel preparation                        was  excellent. Findings:      A 10 mm polyp was found in the hepatic flexure. The polyp was sessile.       The polyp was removed with a hot snare. Resection and retrieval were       complete.      A few small-mouthed diverticula were found in the sigmoid colon and       ascending colon.      The retroflexed view of the distal rectum and anal verge was normal and       showed no anal or rectal abnormalities. Impression:           - One 10 mm polyp at the hepatic flexure, removed with                        a hot snare. Resected and retrieved.                       - Diverticulosis in the sigmoid colon and in the                        ascending colon.                       - The distal rectum and anal verge are normal on                        retroflexion view. Recommendation:       - Telephone endoscopist for pathology results in 1 week. Procedure Code(s):    --- Professional ---                       [phone], Colonoscopy, flexible; with removal of tumor(s),                        polyp(s), or other lesion(s) by snare technique Diagnosis Code(s):    --- Professional ---                       K57.30, Diverticulosis of large intestine without                        perforation or abscess without bleeding                       D12.3, Benign neoplasm of transverse colon (hepatic                        flexure or splenic flexure)                       Z86.010, Personal history of colonic polyps CPT copyright 2018 American Medical Association. All rights reserved. The codes documented  in this report are preliminary and upon coder review may  be revised to meet current compliance requirements. Robert Bellow, MD 08/04/2018 9:21:10 AM This report has been signed electronically. Number of Addenda: 0 Note Initiated On: 08/04/2018 7:10 AM Scope Withdrawal Time: 0 hours 16 minutes 44 seconds  Total Procedure Duration: 0 hours 21 minutes 55 seconds       [facility]

## 2018-08-04 NOTE — Anesthesia Postprocedure Evaluation (Signed)
Anesthesia Post Note  Patient: [name] [name]  Procedure(s) Performed: COLONOSCOPY WITH PROPOFOL (N/A )  Patient location during evaluation: Endoscopy Anesthesia Type: General Level of consciousness: awake and alert Pain management: pain level controlled Vital Signs Assessment: post-procedure vital signs reviewed and stable Respiratory status: spontaneous breathing, nonlabored ventilation and respiratory function stable Cardiovascular status: blood pressure returned to baseline and stable Postop Assessment: no apparent nausea or vomiting Anesthetic complications: no     Last Vitals:  Vitals:   08/04/18 0940 08/04/18 0950  BP: (!) 130/94 135/87  Pulse: 64 82  Resp: 16 16  Temp:    SpO2: 100% 100%    Last Pain:  Vitals:   08/04/18 0920  TempSrc: Tympanic  PainSc:                  Alphonsus Sias

## 2018-08-05 ENCOUNTER — Encounter: Payer: Self-pay | Admitting: General Surgery

## 2018-08-05 LAB — SURGICAL PATHOLOGY

## 2018-08-06 ENCOUNTER — Telehealth: Payer: Self-pay | Admitting: *Deleted

## 2018-08-06 NOTE — Telephone Encounter (Signed)
-----   Message from Robert Bellow, MD sent at 08/06/2018  8:37 AM EST ----- Please notify the patient the polyp removed at the time of her colonoscopy was fine. She should plan on a repeat exam in five years. Thanks.  ----- Message ----- From: Interface, Lab In Three Zero One Sent: 08/05/2018   6:04 PM EST To: Robert Bellow, MD

## 2018-08-06 NOTE — Telephone Encounter (Signed)
Notified patient as instructed, patient agrees.  

## 2018-11-20 ENCOUNTER — Other Ambulatory Visit: Payer: Self-pay | Admitting: Family Medicine

## 2019-02-22 ENCOUNTER — Other Ambulatory Visit: Payer: Self-pay

## 2019-02-22 ENCOUNTER — Ambulatory Visit (INDEPENDENT_AMBULATORY_CARE_PROVIDER_SITE_OTHER): Payer: Medicare HMO

## 2019-02-22 DIAGNOSIS — Z0000 Encounter for general adult medical examination without abnormal findings: Secondary | ICD-10-CM

## 2019-02-22 NOTE — Progress Notes (Signed)
Subjective:   [name] [name] is a 73 y.o. female who presents for Medicare Annual (Subsequent) preventive examination.    This visit is being conducted through telemedicine due to the COVID-19 pandemic. This patient has given me verbal consent via doximity to conduct this visit, patient states they are participating from their home address. Some vital signs may be absent or patient reported.    Patient identification: identified by name, DOB, and current address  Review of Systems:  N/A  Cardiac Risk Factors include: advanced age (>32men, >48 women);dyslipidemia;hypertension;obesity (BMI >30kg/m2)     Objective:     Vitals: There were no vitals taken for this visit.  There is no height or weight on file to calculate BMI. Unable to obtain vitals due to visit being conducted via telephonically.   Advanced Directives 02/22/2019 08/04/2018 02/16/2018 02/20/2016  Does Patient Have a Medical Advance Directive? No No No No  Would patient like information on creating a medical advance directive? No - Patient declined - Yes (MAU/Ambulatory/Procedural Areas - Information given) -    Tobacco Social History   Tobacco Use  Smoking Status Never Smoker  Smokeless Tobacco Never Used  Tobacco Comment   tobacco use - no     Counseling given: Not Answered Comment: tobacco use - no   Clinical Intake:  Pre-visit preparation completed: Yes  Pain : No/denies pain Pain Score: 0-No pain     Nutritional Status: BMI > 30  Obese Nutritional Risks: None Diabetes: No  How often do you need to have someone help you when you read instructions, pamphlets, or other written materials from your doctor or pharmacy?: 1 - Never  Interpreter Needed?: No  Information entered by :: [facility], LPN  Past Medical History:  Diagnosis Date  . Chest pain, unspecified   . H/O left breast biopsy 12/11/2005   [facility], path report matched the clinical impression of left breast lipoma  . HLD (hyperlipidemia)    mixed  . HTN (hypertension)    Past Surgical History:  Procedure Laterality Date  . ABDOMINAL HYSTERECTOMY  2007   total  . BREAST BIOPSY Left 2007   Dr. Dwyane Luo office-benign  . COLONOSCOPY  11/12/2001   multiple sessile polyps found in rectum, 71mm in size, distributed diffusely, biopsies were taken. The polyps extended from lower rectum to approximately 20 cm. They were confluent in the area between 10-15cm. Small polyps with central ulceration were noted.  . COLONOSCOPY WITH PROPOFOL N/A 08/04/2018   Procedure: COLONOSCOPY WITH PROPOFOL;  Surgeon: Robert Bellow, MD;  Location: [facility] ENDOSCOPY;  Service: Endoscopy;  Laterality: N/A;   Family History  Problem Relation Age of Onset  . Cancer Other        ovarian, breast, colon cancers  . Hypertension Mother   . Hyperlipidemia Mother   . Other Mother        enlarged heart  . Heart disease Father 97  . Diabetes Father   . Diabetes Sister   . Hypertension Sister   . Kidney disease Sister   . Stroke Sister   . Heart disease Brother   . Hypertension Brother   . Diabetes Brother   . Healthy Brother   . Cancer Other        family hx  . Coronary artery disease Other        family hx  . Breast cancer Neg Hx    Social History   Socioeconomic History  . Marital status: Married    Spouse name: [name] [name]   .  Number of children: 5  . Years of education: HS  . Highest education level: 12th grade  Occupational History  . Occupation: Semi-Retired  Social Needs  . Financial resource strain: Not hard at all  . Food insecurity:    Worry: Never true    Inability: Never true  . Transportation needs:    Medical: No    Non-medical: No  Tobacco Use  . Smoking status: Never Smoker  . Smokeless tobacco: Never Used  . Tobacco comment: tobacco use - no  Substance and Sexual Activity  . Alcohol use: No  . Drug use: No  . Sexual activity: Not on file  Lifestyle  . Physical activity:    Days per week: 0 days    Minutes per  session: 0 min  . Stress: Not at all  Relationships  . Social connections:    Talks on phone: Patient refused    Gets together: Patient refused    Attends religious service: Patient refused    Active member of club or organization: Patient refused    Attends meetings of clubs or organizations: Patient refused    Relationship status: Patient refused  Other Topics Concern  . Not on file  Social History Narrative   Married, full time, exercise - walks some.    Outpatient Encounter Medications as of 02/22/2019  Medication Sig  . amLODipine (NORVASC) 5 MG tablet TAKE 1 TABLET EVERY DAY  . aspirin 81 MG tablet Take 81 mg by mouth every other day.   Marland Kitchen atorvastatin (LIPITOR) 20 MG tablet TAKE 1 TABLET AT BEDTIME  . metoprolol succinate (TOPROL-XL) 25 MG 24 hr tablet TAKE 1 TABLET (25 MG TOTAL) BY MOUTH DAILY.  . MULTIPLE VITAMINS-MINERALS ER PO Take by mouth daily.    No facility-administered encounter medications on file as of 02/22/2019.     Activities of Daily Living In your present state of health, do you have any difficulty performing the following activities: 02/22/2019  Hearing? N  Vision? N  Comment Wears eye glasses daily.   Difficulty concentrating or making decisions? N  Walking or climbing stairs? Y  Comment Due to SOB occasionally.   Dressing or bathing? N  Doing errands, shopping? N  Preparing Food and eating ? N  Using the Toilet? N  In the past six months, have you accidently leaked urine? Y  Comment Occasionally with urges.   Do you have problems with loss of bowel control? N  Managing your Medications? N  Managing your Finances? N  Housekeeping or managing your Housekeeping? N  Some recent data might be hidden    Patient Care Team: Birdie Sons, MD as PCP - General (Family Medicine) [place], [place] as Consulting Physician    Assessment:   This is a routine wellness examination for [name] [name].  Exercise Activities and Dietary recommendations  Current Exercise Habits: The patient does not participate in regular exercise at present, Exercise limited by: None identified  Goals    . Exercise 3x per week (30 min per time)     Recommend to start exercising 3 days a week for at least 30 minutes at at time.        Fall Risk: Fall Risk  02/22/2019 02/26/2018 02/20/2017 02/20/2016  Falls in the past year? 0 No No No    FALL RISK PREVENTION PERTAINING TO THE HOME:  Any stairs in or around the home? No  If so, are there any without handrails? N/A  Home free of loose  throw rugs in walkways, pet beds, electrical cords, etc? Yes  Adequate lighting in your home to reduce risk of falls? Yes   ASSISTIVE DEVICES UTILIZED TO PREVENT FALLS:  Life alert? No  Use of a cane, walker or w/c? No  Grab bars in the bathroom? No  Shower chair or bench in shower? No  Elevated toilet seat or a handicapped toilet? No    TIMED UP AND GO:  Was the test performed? No .    Depression Screen PHQ 2/9 Scores 02/22/2019 02/16/2018 02/20/2017 02/20/2016  PHQ - 2 Score 0 0 0 0  PHQ- 9 Score - - 1 -     Cognitive Function     6CIT Screen 02/22/2019 02/16/2018  What Year? 0 points 0 points  What month? 0 points 0 points  What time? 0 points 0 points  Count back from 20 0 points 0 points  Months in reverse 0 points 0 points  Repeat phrase 0 points 2 points  Total Score 0 2    Immunization History  Administered Date(s) Administered  . Influenza, High Dose Seasonal PF 08/05/2014, 07/15/2017, 07/31/2018  . Pneumococcal Conjugate-13 12/23/2013  . Pneumococcal Polysaccharide-23 12/16/2010  . Td 10/06/2007    Qualifies for Shingles Vaccine? Yes . Due for Shingrix. Education has been provided regarding the importance of this vaccine. Pt has been advised to call insurance company to determine out of pocket expense. Advised may also receive vaccine at local pharmacy or Health Dept. Verbalized acceptance and understanding.  Tdap: Although this vaccine is  not a covered service during a Wellness Exam, does the patient still wish to receive this vaccine today?  No .  Advised may receive this vaccine at local pharmacy or Health Dept. Aware to provide a copy of the vaccination record if obtained from local pharmacy or Health Dept. Verbalized acceptance and understanding.  Flu Vaccine: Up to date  Pneumococcal Vaccine: Up to date  Screening Tests Health Maintenance  Topic Date Due  . TETANUS/TDAP  10/05/2017  . INFLUENZA VACCINE  04/30/2019  . MAMMOGRAM  03/26/2020  . DEXA SCAN  03/27/2023  . COLONOSCOPY  08/05/2023  . Hepatitis C Screening  Completed  . PNA vac Low Risk Adult  Completed    Cancer Screenings:  Colorectal Screening: Completed 08/14/18. Repeat every 5 years.  Mammogram: Completed 03/26/18.   Bone Density: Completed 03/26/18. Results reflect OSTEOPENIA. Repeat every 5 years.  Lung Cancer Screening: (Low Dose CT Chest recommended if Age 27-80 years, 30 pack-year currently smoking OR have quit w/in 15years.) does not qualify.   Additional Screening:  Hepatitis C Screening: Up to date  Vision Screening: Recommended annual ophthalmology exams for early detection of glaucoma and other disorders of the eye.  Dental Screening: Recommended annual dental exams for proper oral hygiene  Community Resource Referral:  CRR required this visit?  No       Plan:  I have personally reviewed and addressed the Medicare Annual Wellness questionnaire and have noted the following in the patient's chart:  A. Medical and social history B. Use of alcohol, tobacco or illicit drugs  C. Current medications and supplements D. Functional ability and status E.  Nutritional status F.  Physical activity G. Advance directives H. List of other physicians I.  Hospitalizations, surgeries, and ER visits in previous 12 months J.  [place] such as hearing and vision if needed, cognitive and depression L. Referrals and appointments    In addition, I have reviewed and discussed with  patient certain preventive protocols, quality metrics, and best practice recommendations. A written personalized care plan for preventive services as well as general preventive health recommendations were provided to patient. Nurse Health Advisor  Signed,    Jahkari Maclin [place], [place]  [phone] Nurse Health Advisor   Nurse Notes: Declined future order for a tetanus vaccine today.

## 2019-02-22 NOTE — Patient Instructions (Addendum)
[name] [name] , Thank you for taking time to come for your Medicare Wellness Visit. I appreciate your ongoing commitment to your health goals. Please review the following plan we discussed and let me know if I can assist you in the future.   Screening recommendations/referrals: Colonoscopy: Up to date, due 07/2023 Mammogram: Up to date, due 02/2020 Bone Density: Up to date, due 02/2023 Recommended yearly ophthalmology/optometry visit for glaucoma screening and checkup Recommended yearly dental visit for hygiene and checkup  Vaccinations: Influenza vaccine: Up to date Pneumococcal vaccine: Completed series Tdap vaccine: Pt declines today.  Shingles vaccine: Pt declines today.     Advanced directives: Advance directive discussed with you today. Even though you declined this today please call our office should you change your mind and we can give you the proper paperwork for you to fill out.  Conditions/risks identified: Obesity- Recommend to start walking 3 days a week for at least 30 minutes at at time.   Next appointment: 05/20/19 @ 10 AM with Dr Caryn Section.    Preventive Care 74 Years and Older, Female Preventive care refers to lifestyle choices and visits with your health care provider that can promote health and wellness. What does preventive care include?  A yearly physical exam. This is also called an annual well check.  Dental exams once or twice a year.  Routine eye exams. Ask your health care provider how often you should have your eyes checked.  Personal lifestyle choices, including:  Daily care of your teeth and gums.  Regular physical activity.  Eating a healthy diet.  Avoiding tobacco and drug use.  Limiting alcohol use.  Practicing safe sex.  Taking low-dose aspirin every day.  Taking vitamin and mineral supplements as recommended by your health care provider. What happens during an annual well check? The services and screenings done by your health care provider  during your annual well check will depend on your age, overall health, lifestyle risk factors, and family history of disease. Counseling  Your health care provider may ask you questions about your:  Alcohol use.  Tobacco use.  Drug use.  Emotional well-being.  Home and relationship well-being.  Sexual activity.  Eating habits.  History of falls.  Memory and ability to understand (cognition).  Work and work Statistician.  Reproductive health. Screening  You may have the following tests or measurements:  Height, weight, and BMI.  Blood pressure.  Lipid and cholesterol levels. These may be checked every 5 years, or more frequently if you are over 35 years old.  Skin check.  Lung cancer screening. You may have this screening every year starting at age 74 if you have a 30-pack-year history of smoking and currently smoke or have quit within the past 15 years.  Fecal occult blood test (FOBT) of the stool. You may have this test every year starting at age 106.  Flexible sigmoidoscopy or colonoscopy. You may have a sigmoidoscopy every 5 years or a colonoscopy every 10 years starting at age 74.  Hepatitis C blood test.  Hepatitis B blood test.  Sexually transmitted disease (STD) testing.  Diabetes screening. This is done by checking your blood sugar (glucose) after you have not eaten for a while (fasting). You may have this done every 1-3 years.  Bone density scan. This is done to screen for osteoporosis. You may have this done starting at age 28.  Mammogram. This may be done every 1-2 years. Talk to your health care provider about how often you should  have regular mammograms. Talk with your health care provider about your test results, treatment options, and if necessary, the need for more tests. Vaccines  Your health care provider may recommend certain vaccines, such as:  Influenza vaccine. This is recommended every year.  Tetanus, diphtheria, and acellular pertussis  (Tdap, Td) vaccine. You may need a Td booster every 10 years.  Zoster vaccine. You may need this after age 44.  Pneumococcal 13-valent conjugate (PCV13) vaccine. One dose is recommended after age 62.  Pneumococcal polysaccharide (PPSV23) vaccine. One dose is recommended after age 66. Talk to your health care provider about which screenings and vaccines you need and how often you need them. This information is not intended to replace advice given to you by your health care provider. Make sure you discuss any questions you have with your health care provider. Document Released: 10/12/2015 Document Revised: 06/04/2016 Document Reviewed: 07/17/2015 Elsevier Interactive Patient Education  2017 [place] Prevention in the Home Falls can cause injuries. They can happen to people of all ages. There are many things you can do to make your home safe and to help prevent falls. What can I do on the outside of my home?  Regularly fix the edges of walkways and driveways and fix any cracks.  Remove anything that might make you trip as you walk through a door, such as a raised step or threshold.  Trim any bushes or trees on the path to your home.  Use bright outdoor lighting.  Clear any walking paths of anything that might make someone trip, such as rocks or tools.  Regularly check to see if handrails are loose or broken. Make sure that both sides of any steps have handrails.  Any raised decks and porches should have guardrails on the edges.  Have any leaves, snow, or ice cleared regularly.  Use sand or salt on walking paths during winter.  Clean up any spills in your garage right away. This includes oil or grease spills. What can I do in the bathroom?  Use night lights.  Install grab bars by the toilet and in the tub and shower. Do not use towel bars as grab bars.  Use non-skid mats or decals in the tub or shower.  If you need to sit down in the shower, use a plastic, non-slip  stool.  Keep the floor dry. Clean up any water that spills on the floor as soon as it happens.  Remove soap buildup in the tub or shower regularly.  Attach bath mats securely with double-sided non-slip rug tape.  Do not have throw rugs and other things on the floor that can make you trip. What can I do in the bedroom?  Use night lights.  Make sure that you have a light by your bed that is easy to reach.  Do not use any sheets or blankets that are too big for your bed. They should not hang down onto the floor.  Have a firm chair that has side arms. You can use this for support while you get dressed.  Do not have throw rugs and other things on the floor that can make you trip. What can I do in the kitchen?  Clean up any spills right away.  Avoid walking on wet floors.  Keep items that you use a lot in easy-to-reach places.  If you need to reach something above you, use a strong step stool that has a grab bar.  Keep electrical cords out of  the way.  Do not use floor polish or wax that makes floors slippery. If you must use wax, use non-skid floor wax.  Do not have throw rugs and other things on the floor that can make you trip. What can I do with my stairs?  Do not leave any items on the stairs.  Make sure that there are handrails on both sides of the stairs and use them. Fix handrails that are broken or loose. Make sure that handrails are as long as the stairways.  Check any carpeting to make sure that it is firmly attached to the stairs. Fix any carpet that is loose or worn.  Avoid having throw rugs at the top or bottom of the stairs. If you do have throw rugs, attach them to the floor with carpet tape.  Make sure that you have a light switch at the top of the stairs and the bottom of the stairs. If you do not have them, ask someone to add them for you. What else can I do to help prevent falls?  Wear shoes that:  Do not have high heels.  Have rubber bottoms.  Are  comfortable and fit you well.  Are closed at the toe. Do not wear sandals.  If you use a stepladder:  Make sure that it is fully opened. Do not climb a closed stepladder.  Make sure that both sides of the stepladder are locked into place.  Ask someone to hold it for you, if possible.  Clearly mark and make sure that you can see:  Any grab bars or handrails.  First and last steps.  Where the edge of each step is.  Use tools that help you move around (mobility aids) if they are needed. These include:  Canes.  Walkers.  Scooters.  Crutches.  Turn on the lights when you go into a dark area. Replace any light bulbs as soon as they burn out.  Set up your furniture so you have a clear path. Avoid moving your furniture around.  If any of your floors are uneven, fix them.  If there are any pets around you, be aware of where they are.  Review your medicines with your doctor. Some medicines can make you feel dizzy. This can increase your chance of falling. Ask your doctor what other things that you can do to help prevent falls. This information is not intended to replace advice given to you by your health care provider. Make sure you discuss any questions you have with your health care provider. Document Released: 07/12/2009 Document Revised: 02/21/2016 Document Reviewed: 10/20/2014 Elsevier Interactive Patient Education  2017 [employer].

## 2019-03-07 ENCOUNTER — Other Ambulatory Visit: Payer: Self-pay | Admitting: Family Medicine

## 2019-03-07 DIAGNOSIS — Z1231 Encounter for screening mammogram for malignant neoplasm of breast: Secondary | ICD-10-CM

## 2019-03-22 ENCOUNTER — Other Ambulatory Visit: Payer: Self-pay | Admitting: Family Medicine

## 2019-03-22 DIAGNOSIS — I10 Essential (primary) hypertension: Secondary | ICD-10-CM

## 2019-04-05 ENCOUNTER — Other Ambulatory Visit: Payer: Self-pay | Admitting: Family Medicine

## 2019-04-05 DIAGNOSIS — E7800 Pure hypercholesterolemia, unspecified: Secondary | ICD-10-CM

## 2019-04-19 ENCOUNTER — Other Ambulatory Visit: Payer: Self-pay

## 2019-04-19 ENCOUNTER — Ambulatory Visit
Admission: RE | Admit: 2019-04-19 | Discharge: 2019-04-19 | Disposition: A | Discharge status: Home / Self Care | Payer: Medicare HMO | Source: Ambulatory Visit | Attending: Family Medicine | Admitting: Family Medicine

## 2019-04-19 DIAGNOSIS — Z1231 Encounter for screening mammogram for malignant neoplasm of breast: Secondary | ICD-10-CM | POA: Diagnosis not present

## 2019-05-20 ENCOUNTER — Encounter: Payer: Self-pay | Admitting: Family Medicine

## 2019-05-20 ENCOUNTER — Ambulatory Visit (INDEPENDENT_AMBULATORY_CARE_PROVIDER_SITE_OTHER): Payer: Medicare HMO | Admitting: Family Medicine

## 2019-05-20 ENCOUNTER — Other Ambulatory Visit: Payer: Self-pay

## 2019-05-20 VITALS — BP 124/79 | HR 83 | Temp 97.1°F | Ht 67.0 in | Wt 196.0 lb

## 2019-05-20 DIAGNOSIS — R42 Dizziness and giddiness: Secondary | ICD-10-CM | POA: Diagnosis not present

## 2019-05-20 DIAGNOSIS — E7800 Pure hypercholesterolemia, unspecified: Secondary | ICD-10-CM | POA: Diagnosis not present

## 2019-05-20 DIAGNOSIS — I10 Essential (primary) hypertension: Secondary | ICD-10-CM

## 2019-05-20 DIAGNOSIS — R202 Paresthesia of skin: Secondary | ICD-10-CM

## 2019-05-20 DIAGNOSIS — R200 Anesthesia of skin: Secondary | ICD-10-CM | POA: Diagnosis not present

## 2019-05-20 DIAGNOSIS — Z0000 Encounter for general adult medical examination without abnormal findings: Secondary | ICD-10-CM

## 2019-05-20 MED ORDER — AMLODIPINE BESYLATE 5 MG PO TABS: 2.5000 mg | ORAL_TABLET | Freq: Every day | ORAL | 0 refills | Status: DC

## 2019-05-20 NOTE — Progress Notes (Signed)
Patient: [name] [name], Female    DOB: 02-16-1946, 73 y.o.   MRN: [id number] Visit Date: 05/20/2019  Today's Provider: Lelon Huh, MD   Chief Complaint  Patient presents with  . Annual Exam   Subjective:     Complete Physical [name] is a 73 y.o. female. She feels well.  Pt reports tingling in both hands for the past few weeks.  Right worse than left.  She also mentions her BP has been running low, which is causing some dizziness.  110/70's about two weeks ago.  Pt reports this seems to have improved.   She reports exercising regularly. She reports she is sleeping well.  ----------------------------------------------------------- Has a few episodes of tingling in hands, usually on right but one time on left. Occasionally feels dizzy, usually associated with low blood pressure.    Review of Systems  Constitutional: Negative.   HENT: Positive for dental problem, hearing loss and tinnitus. Negative for congestion, drooling, ear discharge, ear pain, facial swelling, mouth sores, nosebleeds, postnasal drip, rhinorrhea, sinus pressure, sinus pain, sneezing, sore throat, trouble swallowing and voice change.   Eyes: Negative.   Respiratory: Negative.   Cardiovascular: Negative.   Gastrointestinal: Negative.   Endocrine: Negative.   Genitourinary: Positive for urgency. Negative for decreased urine volume, difficulty urinating, dyspareunia, dysuria, enuresis, flank pain, frequency, genital sores, hematuria, menstrual problem, pelvic pain, vaginal bleeding, vaginal discharge and vaginal pain.  Musculoskeletal: Negative.   Skin: Negative.   Allergic/Immunologic: Negative.   Neurological: Positive for light-headedness and numbness. Negative for dizziness, tremors, seizures, syncope, facial asymmetry, speech difficulty, weakness and headaches.  Hematological: Negative.   Psychiatric/Behavioral: Negative.     Social History   Socioeconomic History  . Marital status: Married   Spouse name: Herbie Baltimore   . Number of children: 5  . Years of education: HS  . Highest education level: 12th grade  Occupational History  . Occupation: Semi-Retired  Social Needs  . Financial resource strain: Not hard at all  . Food insecurity    Worry: Never true    Inability: Never true  . Transportation needs    Medical: No    Non-medical: No  Tobacco Use  . Smoking status: Never Smoker  . Smokeless tobacco: Never Used  . Tobacco comment: tobacco use - no  Substance and Sexual Activity  . Alcohol use: No  . Drug use: No  . Sexual activity: Not on file  Lifestyle  . Physical activity    Days per week: 0 days    Minutes per session: 0 min  . Stress: Not at all  Relationships  . Social Herbalist on phone: Patient refused    Gets together: Patient refused    Attends religious service: Patient refused    Active member of club or organization: Patient refused    Attends meetings of clubs or organizations: Patient refused    Relationship status: Patient refused  . Intimate partner violence    Fear of current or ex partner: Patient refused    Emotionally abused: Patient refused    Physically abused: Patient refused    Forced sexual activity: Patient refused  Other Topics Concern  . Not on file  Social History Narrative   Married, full time, exercise - walks some.    Past Medical History:  Diagnosis Date  . Chest pain, unspecified   . H/O left breast biopsy 12/11/2005   [facility], path report matched the clinical impression of left  breast lipoma  . HLD (hyperlipidemia)    mixed  . HTN (hypertension)      Patient Active Problem List   Diagnosis Date Noted  . History of colonic polyps 06/30/2018  . Osteopenia 02/20/2017  . Plantar fasciitis, right 07/10/2016  . Colitis 02/20/2016  . Acid reflux 02/20/2016  . Hypercholesteremia 02/20/2016  . BP (high blood pressure) 02/20/2016  . Irregular cardiac rhythm 02/20/2016  . Calculus of kidney 02/20/2016  . Atrophy  of thyroid 02/20/2016  . Temporary cerebral vascular dysfunction 02/20/2016  . Neck pain on left side 02/06/2014  . Ankle swelling 02/06/2014  . Asymptomatic PVCs 02/06/2014    Past Surgical History:  Procedure Laterality Date  . ABDOMINAL HYSTERECTOMY  2007   total  . BREAST BIOPSY Left 2007   Dr. Dwyane Luo office-benign  . COLONOSCOPY  11/12/2001   multiple sessile polyps found in rectum, 11mm in size, distributed diffusely, biopsies were taken. The polyps extended from lower rectum to approximately 20 cm. They were confluent in the area between 10-15cm. Small polyps with central ulceration were noted.  . COLONOSCOPY WITH PROPOFOL N/A 08/04/2018   Procedure: COLONOSCOPY WITH PROPOFOL;  Surgeon: Robert Bellow, MD;  Location: [facility];  Service: Endoscopy;  Laterality: N/A;    Her family history includes Cancer in some other family members; Coronary artery disease in an other family member; Diabetes in her brother, father, and sister; Healthy in her brother; Heart disease in her brother; Heart disease (age of onset: 26) in her father; Hyperlipidemia in her mother; Hypertension in her brother, mother, and sister; Kidney disease in her sister; Other in her mother; Stroke in her sister. There is no history of Breast cancer.   Current Outpatient Medications:  .  amLODipine (NORVASC) 5 MG tablet, TAKE 1 TABLET EVERY DAY, Disp: 90 tablet, Rfl: 4 .  aspirin 81 MG tablet, Take 81 mg by mouth every other day. , Disp: , Rfl:  .  atorvastatin (LIPITOR) 20 MG tablet, TAKE 1 TABLET AT BEDTIME, Disp: 90 tablet, Rfl: 1 .  Biotin (BIOTIN MAXIMUM STRENGTH) 10 MG TABS, Take by mouth., Disp: , Rfl:  .  metoprolol succinate (TOPROL-XL) 25 MG 24 hr tablet, TAKE 1 TABLET (25 MG TOTAL) BY MOUTH DAILY., Disp: 90 tablet, Rfl: 4 .  MULTIPLE VITAMINS-MINERALS ER PO, Take by mouth daily. , Disp: , Rfl:   Patient Care Team: Birdie Sons, MD as PCP - General (Family Medicine) [place], [place]  as Consulting Physician     Objective:    Vitals: BP 124/79 (BP Location: Right Arm, Patient Position: Sitting, Cuff Size: Large)   Pulse 83   Temp (!) 97.1 F (36.2 C) (Temporal)   Ht 5\' 7"  (1.702 m)   Wt 196 lb (88.9 kg)   BMI 30.70 kg/m   Physical Exam   General Appearance:    Alert, cooperative, no distress, appears stated age  Head:    Normocephalic, without obvious abnormality, atraumatic  Eyes:    PERRL, conjunctiva/corneas clear, EOM's intact, fundi    benign, both eyes  Ears:    Normal TM's and external ear canals, both ears  Nose:   Nares normal, septum midline, mucosa normal, no drainage    or sinus tenderness  Throat:   Lips, mucosa, and tongue normal; teeth and gums normal  Neck:   Supple, symmetrical, trachea midline, no adenopathy;    thyroid:  no enlargement/tenderness/nodules; no carotid   bruit or JVD  Back:  Symmetric, no curvature, ROM normal, no CVA tenderness  Lungs:     Clear to auscultation bilaterally, respirations unlabored  Chest Wall:    No tenderness or deformity   Heart:    Normal heart rate. Normal rhythm. No murmurs, rubs, or gallops.   Breast Exam:    patient declined.   Abdomen:     Soft, non-tender, bowel sounds active all four quadrants,    no masses, no organomegaly  Pelvic:    deferred  Extremities:   All extremities are intact. No cyanosis or edema  Pulses:   2+ and symmetric all extremities  Skin:   Skin color, texture, turgor normal, no rashes or lesions  Lymph nodes:   Cervical, supraclavicular, and axillary nodes normal  Neurologic:   CNII-XII intact, normal strength, sensation and reflexes    throughout    Activities of Daily Living In your present state of health, do you have any difficulty performing the following activities: 02/22/2019  Hearing? N  Vision? N  Comment Wears eye glasses daily.   Difficulty concentrating or making decisions? N  Walking or climbing stairs? Y  Comment Due to SOB occasionally.   Dressing  or bathing? N  Doing errands, shopping? N  Preparing Food and eating ? N  Using the Toilet? N  In the past six months, have you accidently leaked urine? Y  Comment Occasionally with urges.   Do you have problems with loss of bowel control? N  Managing your Medications? N  Managing your Finances? N  Housekeeping or managing your Housekeeping? N  Some recent data might be hidden    Fall Risk Assessment Fall Risk  02/22/2019 02/26/2018 02/20/2017 02/20/2016  Falls in the past year? 0 No No No     Depression Screen PHQ 2/9 Scores 02/22/2019 02/16/2018 02/20/2017 02/20/2016  PHQ - 2 Score 0 0 0 0  PHQ- 9 Score - - 1 -    6CIT Screen 02/22/2019  What Year? 0 points  What month? 0 points  What time? 0 points  Count back from 20 0 points  Months in reverse 0 points  Repeat phrase 0 points  Total Score 0       Assessment & Plan:    Annual Physical Reviewed patient's Family Medical History Reviewed and updated list of patient's medical providers Assessment of cognitive impairment was done Assessed patient's functional ability Established a written schedule for health screening [place] Completed and Reviewed  Exercise Activities and Dietary recommendations Goals    . Exercise 3x per week (30 min per time)     Recommend to start exercising 3 days a week for at least 30 minutes at at time.        Immunization History  Administered Date(s) Administered  . Influenza, High Dose Seasonal PF 08/05/2014, 07/15/2017, 07/31/2018  . Pneumococcal Conjugate-13 12/23/2013  . Pneumococcal Polysaccharide-23 12/16/2010  . Td 10/06/2007    Health Maintenance  Topic Date Due  . TETANUS/TDAP  10/05/2017  . INFLUENZA VACCINE  04/30/2019  . MAMMOGRAM  04/18/2021  . DEXA SCAN  03/27/2023  . COLONOSCOPY  08/05/2023  . Hepatitis C Screening  Completed  . PNA vac Low Risk Adult  Completed     Discussed health benefits of physical activity, and encouraged her to  engage in regular exercise appropriate for her age and condition.    ------------------------------------------------------------------------------------------------------------  1. Annual physical exam Normal exam.   2. Essential hypertension Very well controlled. Feels a little dizzy when BP  gets too low, will lower amlodipine dose.  - EKG 12-Lead - amLODipine (NORVASC) 5 MG tablet; Take 0.5 tablets (2.5 mg total) by mouth daily.  Dispense: 3 tablet; Refill: 0  3. Dizziness  - CBC - TSH - US Carotid Duplex Bilateral; Future  4. Numbness and tingling in both hands  - US Carotid Duplex Bilateral; Future  5. Hypercholesteremia She is tolerating atorvastatin well with no adverse effects.   - Comprehensive metabolic panel - Lipid panel - CBC - TSH    Lelon Huh, MD  [facility]

## 2019-05-20 NOTE — Patient Instructions (Addendum)
.   Please review the attached list of medications and notify my office if there are any errors.   . Please bring all of your medications to every appointment so we can make sure that our medication list is the same as yours.    We will have flu vaccines available after Labor Day. Please go to your pharmacy or call the office in early September to schedule you flu shot.   Reduce amlodipine to 1/2 tablet daily

## 2019-05-21 LAB — COMPREHENSIVE METABOLIC PANEL
ALT: 20 IU/L (ref 0–32)
AST: 24 IU/L (ref 0–40)
Albumin/Globulin Ratio: 1.5 (ref 1.2–2.2)
Albumin: 4.1 g/dL (ref 3.7–4.7)
Alkaline Phosphatase: 91 IU/L (ref 39–117)
BUN/Creatinine Ratio: 17 (ref 12–28)
BUN: 13 mg/dL (ref 8–27)
Bilirubin Total: 0.4 mg/dL (ref 0.0–1.2)
CO2: 23 mmol/L (ref 20–29)
Calcium: 9.3 mg/dL (ref 8.7–10.3)
Chloride: 105 mmol/L (ref 96–106)
Creatinine, Ser: 0.76 mg/dL (ref 0.57–1.00)
GFR calc Af Amer: 89 mL/min/{1.73_m2} (ref >59)
GFR calc non Af Amer: 78 mL/min/{1.73_m2} (ref >59)
Globulin, Total: 2.7 g/dL (ref 1.5–4.5)
Glucose: 86 mg/dL (ref 65–99)
Potassium: 3.9 mmol/L (ref 3.5–5.2)
Sodium: 141 mmol/L (ref 134–144)
Total Protein: 6.8 g/dL (ref 6.0–8.5)

## 2019-05-21 LAB — CBC
Hematocrit: 36.1 % (ref 34.0–46.6)
Hemoglobin: 12.7 g/dL (ref 11.1–15.9)
MCH: 32.2 pg (ref 26.6–33.0)
MCHC: 35.2 g/dL (ref 31.5–35.7)
MCV: 91 fL (ref 79–97)
Platelets: 211 10*3/uL (ref 150–450)
RBC: 3.95 x10E6/uL (ref 3.77–5.28)
RDW: 12.5 % (ref 11.7–15.4)
WBC: 3.2 10*3/uL — ABNORMAL LOW (ref 3.4–10.8)

## 2019-05-21 LAB — LIPID PANEL
Chol/HDL Ratio: 2.9 ratio (ref 0.0–4.4)
Cholesterol, Total: 198 mg/dL (ref 100–199)
HDL: 68 mg/dL (ref >39)
LDL Calculated: 113 mg/dL — ABNORMAL HIGH (ref 0–99)
Triglycerides: 84 mg/dL (ref 0–149)
VLDL Cholesterol Cal: 17 mg/dL (ref 5–40)

## 2019-05-21 LAB — TSH: TSH: 1.2 u[IU]/mL (ref 0.450–4.500)

## 2019-05-23 ENCOUNTER — Telehealth: Payer: Self-pay

## 2019-05-23 NOTE — Telephone Encounter (Signed)
-----   Message from Birdie Sons, MD sent at 05/21/2019 10:21 AM EDT ----- Labs are very good. Cholesterol is controlled at 198. Continue current medications.  Check labs yearly.

## 2019-05-23 NOTE — Telephone Encounter (Signed)
Left message advising pt.  (Per DPR)  Thanks,   -Terrius Gentile  

## 2019-06-01 ENCOUNTER — Other Ambulatory Visit: Payer: Self-pay

## 2019-06-01 ENCOUNTER — Ambulatory Visit
Admission: RE | Admit: 2019-06-01 | Discharge: 2019-06-01 | Disposition: A | Discharge status: Home / Self Care | Payer: Medicare HMO | Source: Ambulatory Visit | Attending: Family Medicine | Admitting: Family Medicine

## 2019-06-01 DIAGNOSIS — R200 Anesthesia of skin: Secondary | ICD-10-CM | POA: Insufficient documentation

## 2019-06-01 DIAGNOSIS — R42 Dizziness and giddiness: Secondary | ICD-10-CM | POA: Insufficient documentation

## 2019-06-01 DIAGNOSIS — R202 Paresthesia of skin: Secondary | ICD-10-CM | POA: Insufficient documentation

## 2019-06-01 DIAGNOSIS — I6523 Occlusion and stenosis of bilateral carotid arteries: Secondary | ICD-10-CM | POA: Diagnosis not present

## 2019-06-02 ENCOUNTER — Telehealth: Payer: Self-pay

## 2019-06-02 ENCOUNTER — Encounter: Payer: Self-pay | Admitting: Family Medicine

## 2019-06-02 DIAGNOSIS — I779 Disorder of arteries and arterioles, unspecified: Secondary | ICD-10-CM | POA: Insufficient documentation

## 2019-06-02 NOTE — Telephone Encounter (Signed)
-----   Message from Birdie Sons, MD sent at 06/02/2019  8:38 AM EDT ----- Ultrasound shows mild cholesterol plaques. This might be causing some ministrokes. Treatment is to keep BP under good control and get LDL cholesterol under 70. Need to increase atorvastatin to 40mg  daily, #90, rf x 1  (she can double up on the 20mg  tablets until they are gone if she likes.) continue 81mg  aspirin daily and schedule follow up to check lipids and BP in 3 months.

## 2019-06-02 NOTE — Telephone Encounter (Signed)
No answer

## 2019-06-03 MED ORDER — ATORVASTATIN CALCIUM 40 MG PO TABS: 40.0000 mg | ORAL_TABLET | Freq: Every day | ORAL | 1 refills | Status: DC

## 2019-06-03 NOTE — Addendum Note (Signed)
Addended by: Julieta Bellini on: 06/03/2019 04:31 PM   Modules accepted: Orders

## 2019-06-03 NOTE — Telephone Encounter (Signed)
Patient was notified of results. Expressed understanding. Rx sent to pharmacy and 3 month follow up was scheduled.[name] [name]

## 2019-06-03 NOTE — Telephone Encounter (Signed)
Tried calling patient. Left message to call back. 

## 2019-07-25 DIAGNOSIS — Z0100 Encounter for examination of eyes and vision without abnormal findings: Secondary | ICD-10-CM | POA: Diagnosis not present

## 2019-07-25 DIAGNOSIS — H524 Presbyopia: Secondary | ICD-10-CM | POA: Diagnosis not present

## 2019-09-02 ENCOUNTER — Ambulatory Visit: Payer: Self-pay | Admitting: Family Medicine

## 2019-09-06 NOTE — Progress Notes (Signed)
Patient: [name] [name] Female    DOB: 05/30/1946   73 y.o.   MRN: [id number] Visit Date: 09/07/2019  Today's Provider: Lelon Huh, MD   Chief Complaint  Patient presents with  . Hypertension  . Hyperlipidemia   Subjective:     HPI  Lipid/Cholesterol, Follow-up:   Last seen for this 4 months ago.  Management changes since that visit include increasing Atorvastatin 40mg  daily due to mild cholesterol plaques seen on ultrasound.  Last Lipid Panel:    Component Value Date/Time   CHOL 198 05/20/2019 1052   TRIG 84 05/20/2019 1052   HDL 68 05/20/2019 1052   CHOLHDL 2.9 05/20/2019 1052   LDLCALC 113 (H) 05/20/2019 1052    Risk factors for vascular disease include hypercholesterolemia and hypertension  She reports good compliance with treatment. She is not having side effects.  Current symptoms include none and have been stable. Weight trend: stable Prior visit with dietician: no Current diet: well balanced Current exercise: walking  Wt Readings from Last 3 Encounters:  09/07/19 197 lb (89.4 kg)  05/20/19 196 lb (88.9 kg)  08/04/18 200 lb (90.7 kg)    -------------------------------------------------------------------  Hypertension, follow-up:  BP Readings from Last 3 Encounters:  09/07/19 132/80  05/20/19 124/79  08/04/18 135/87    She was last seen for hypertension 4 months ago.  BP at that visit was 124/79. Management since that visit includes decreasing Amlodipine to 2.5mg  daily. Patient has been taking on full tablet daily of Amlodipine 5mg .  She reports good compliance with treatment. She is not having side effects.  She is exercising. She is not adherent to low salt diet.   Outside blood pressures are 130-140/ 80's. She is experiencing none.  Patient denies chest pain, chest pressure/discomfort, claudication, dyspnea, exertional chest pressure/discomfort, fatigue, irregular heart beat, lower extremity edema, near-syncope, orthopnea,  palpitations, paroxysmal nocturnal dyspnea, syncope and tachypnea.   Cardiovascular risk factors include advanced age (older than 14 for men, 31 for women) and dyslipidemia.  Use of agents associated with hypertension: NSAIDS.     Weight trend: stable Wt Readings from Last 3 Encounters:  09/07/19 197 lb (89.4 kg)  05/20/19 196 lb (88.9 kg)  08/04/18 200 lb (90.7 kg)    Current diet: well balanced  ------------------------------------------------------------------------  TIA: At her last visit she was complaining of episodes of numbness in her hands. Carotis shows mild disease, atorvastatin was increased with goal to get ldl <80. She is tolerating well with no adverse effects, has not had any more episodes of tingling.   She also reports she lost her footing and fell on her right knee 2 days ago. Is able to bear weight and walk without limitation, but is a little painful. Applied ice last night and feels better today.    No Known Allergies   Current Outpatient Medications:  .  amLODipine (NORVASC) 5 MG tablet, Take 0.5 tablets (2.5 mg total) by mouth daily. (Patient taking differently: Take 5 mg by mouth daily. ), Disp: 3 tablet, Rfl: 0 .  aspirin 81 MG tablet, Take 81 mg by mouth every other day. , Disp: , Rfl:  .  atorvastatin (LIPITOR) 40 MG tablet, Take 1 tablet (40 mg total) by mouth daily., Disp: 90 tablet, Rfl: 1 .  Biotin (BIOTIN MAXIMUM STRENGTH) 10 MG TABS, Take by mouth., Disp: , Rfl:  .  Cholecalciferol 125 MCG (5000 UT) TABS, Take 1 tablet by mouth daily., Disp: , Rfl:  .  metoprolol  succinate (TOPROL-XL) 25 MG 24 hr tablet, TAKE 1 TABLET (25 MG TOTAL) BY MOUTH DAILY., Disp: 90 tablet, Rfl: 4 .  MULTIPLE VITAMINS-MINERALS ER PO, Take by mouth daily. , Disp: , Rfl:   Review of Systems  Constitutional: Negative for appetite change, chills, fatigue and fever.  Respiratory: Negative for chest tightness and shortness of breath.   Cardiovascular: Negative for chest pain and  palpitations.  Gastrointestinal: Negative for abdominal pain, nausea and vomiting.  Neurological: Negative for dizziness and weakness.    Social History   Tobacco Use  . Smoking status: Never Smoker  . Smokeless tobacco: Never Used  . Tobacco comment: tobacco use - no  Substance Use Topics  . Alcohol use: No      Objective:   BP 132/80 (BP Location: Left Arm, Patient Position: Sitting, Cuff Size: Large)   Pulse 92   Temp (!) 97.3 F (36.3 C) (Temporal)   Resp 16   Wt 197 lb (89.4 kg)   SpO2 97% Comment: room air  BMI 30.85 kg/m  Vitals:   09/07/19 0811  BP: 132/80  Pulse: 92  Resp: 16  Temp: (!) 97.3 F (36.3 C)  TempSrc: Temporal  SpO2: 97%  Weight: 197 lb (89.4 kg)  Body mass index is 30.85 kg/m.   Physical Exam  General appearance: Obese female, cooperative and in no acute distress Head: Normocephalic, without obvious abnormality, atraumatic Respiratory: Respirations even and unlabored, normal respiratory rate Extremities: All extremities are intact.  Skin: Skin color, texture, turgor normal. No rashes seen  Psych: Appropriate mood and affect. Neurologic: Mental status: Alert, oriented to person, place, and time, thought content appropriate.      Assessment & Plan     1. Hypercholesteremia Tolerating increase dose of atorvastatin well. Goal is LDL < 80 due to carotid atherosclerosis.  - Lipid panel (Fasting) - Comprehensive metabolic panel  2. Temporary cerebral vascular dysfunction Resolved, no episodes since last visit.   3. Essential hypertension Has not had any more dizzy spells, so has gone back up to a full amlodipine tablet daily - amLODipine (NORVASC) 5 MG tablet; Take 1 tablet (5 mg total) by mouth daily.  4. Mild carotid artery disease (HCC) Increase statin as above.   5. Contusion of right knee, initial encounter Is ambulating and bearing weight without difficulty. Continue ice applications today, then alternate with heat.      The entirety of the information documented in the History of Present Illness, Review of Systems and Physical Exam were personally obtained by me. Portions of this information were initially documented by Meyer Cory, CMA and reviewed by me for thoroughness and accuracy.      Lelon Huh, MD  [facility]

## 2019-09-07 ENCOUNTER — Ambulatory Visit (INDEPENDENT_AMBULATORY_CARE_PROVIDER_SITE_OTHER): Payer: Medicare HMO | Admitting: Family Medicine

## 2019-09-07 ENCOUNTER — Other Ambulatory Visit: Payer: Self-pay

## 2019-09-07 ENCOUNTER — Encounter: Payer: Self-pay | Admitting: Family Medicine

## 2019-09-07 VITALS — BP 132/80 | HR 92 | Temp 97.3°F | Resp 16 | Wt 197.0 lb

## 2019-09-07 DIAGNOSIS — E7800 Pure hypercholesterolemia, unspecified: Secondary | ICD-10-CM

## 2019-09-07 DIAGNOSIS — I779 Disorder of arteries and arterioles, unspecified: Secondary | ICD-10-CM | POA: Diagnosis not present

## 2019-09-07 DIAGNOSIS — G939 Disorder of brain, unspecified: Secondary | ICD-10-CM

## 2019-09-07 DIAGNOSIS — S8001XA Contusion of right knee, initial encounter: Secondary | ICD-10-CM | POA: Diagnosis not present

## 2019-09-07 DIAGNOSIS — I6782 Cerebral ischemia: Secondary | ICD-10-CM

## 2019-09-07 DIAGNOSIS — I10 Essential (primary) hypertension: Secondary | ICD-10-CM

## 2019-09-07 MED ORDER — AMLODIPINE BESYLATE 5 MG PO TABS: 5.0000 mg | ORAL_TABLET | Freq: Every day | ORAL | Status: DC

## 2019-09-08 LAB — COMPREHENSIVE METABOLIC PANEL
ALT: 22 IU/L (ref 0–32)
AST: 19 IU/L (ref 0–40)
Albumin/Globulin Ratio: 1.5 (ref 1.2–2.2)
Albumin: 4 g/dL (ref 3.7–4.7)
Alkaline Phosphatase: 98 IU/L (ref 39–117)
BUN/Creatinine Ratio: 19 (ref 12–28)
BUN: 12 mg/dL (ref 8–27)
Bilirubin Total: 0.4 mg/dL (ref 0.0–1.2)
CO2: 23 mmol/L (ref 20–29)
Calcium: 8.9 mg/dL (ref 8.7–10.3)
Chloride: 108 mmol/L — ABNORMAL HIGH (ref 96–106)
Creatinine, Ser: 0.63 mg/dL (ref 0.57–1.00)
GFR calc Af Amer: 102 mL/min/{1.73_m2} (ref >59)
GFR calc non Af Amer: 89 mL/min/{1.73_m2} (ref >59)
Globulin, Total: 2.6 g/dL (ref 1.5–4.5)
Glucose: 95 mg/dL (ref 65–99)
Potassium: 3.6 mmol/L (ref 3.5–5.2)
Sodium: 144 mmol/L (ref 134–144)
Total Protein: 6.6 g/dL (ref 6.0–8.5)

## 2019-09-08 LAB — LIPID PANEL
Chol/HDL Ratio: 2.4 ratio (ref 0.0–4.4)
Cholesterol, Total: 156 mg/dL (ref 100–199)
HDL: 66 mg/dL (ref >39)
LDL Chol Calc (NIH): 77 mg/dL (ref 0–99)
Triglycerides: 65 mg/dL (ref 0–149)
VLDL Cholesterol Cal: 13 mg/dL (ref 5–40)

## 2019-09-09 ENCOUNTER — Telehealth: Payer: Self-pay

## 2019-09-09 NOTE — Telephone Encounter (Signed)
LMTCB, okay for PEC to advise patient.  

## 2019-09-09 NOTE — Telephone Encounter (Signed)
-----   Message from Birdie Sons, MD sent at 09/08/2019  9:05 AM EST ----- Labs very good, cholesterol well controlled. Continue current medications.  Check yearly.

## 2019-09-13 NOTE — Telephone Encounter (Signed)
Patient advised.

## 2019-12-09 ENCOUNTER — Other Ambulatory Visit: Payer: Self-pay | Admitting: Family Medicine

## 2019-12-21 ENCOUNTER — Other Ambulatory Visit: Payer: Self-pay | Admitting: Family Medicine

## 2019-12-21 NOTE — Telephone Encounter (Signed)
Requested medication (s) are due for refill today: yes  Requested medication (s) are on the active medication list: yes  Last refill: 11/21/18  Future visit scheduled: yes  Notes to clinic: Rx has expired    Requested Prescriptions  Pending Prescriptions Disp Refills   metoprolol succinate (TOPROL-XL) 25 MG 24 hr tablet [Pharmacy Med Name: METOPROLOL SUCCINATE ER 25 MG Tablet Extended Release 24 Hour] 90 tablet 4    Sig: TAKE 1 TABLET BY MOUTH DAILY.      Cardiovascular:  Beta Blockers Passed - 12/21/2019  3:21 PM      Passed - Last BP in normal range    BP Readings from Last 1 Encounters:  09/07/19 132/80          Passed - Last Heart Rate in normal range    Pulse Readings from Last 1 Encounters:  09/07/19 92          Passed - Valid encounter within last 6 months    Recent Outpatient Visits           3 months ago [place], MD   7 months ago Annual physical exam   [facility] Birdie Sons, MD   1 year ago Annual physical exam   [facility] Birdie Sons, MD   2 years ago Medicare annual wellness visit, subsequent   [facility] Birdie Sons, MD   3 years ago Plantar fasciitis, right   [facility] Birdie Sons, MD       Future Appointments             In 2 months [facility], [place]

## 2020-02-23 ENCOUNTER — Ambulatory Visit: Payer: Medicare HMO

## 2020-02-28 ENCOUNTER — Telehealth: Payer: Self-pay

## 2020-02-28 NOTE — Chronic Care Management (AMB) (Signed)
Received a message from Pt to cancel appt, Outreach to see which appt needs to cancel NANM, so unable to LM  Will try to outreach again   Noreene Larsson, [place], [place], [place] [address] Direct Dial: [phone] [email or web address] Website: [email or web address]

## 2020-02-28 NOTE — Progress Notes (Signed)
N/S apt.  This encounter was created in error - please disregard. 

## 2020-02-29 ENCOUNTER — Other Ambulatory Visit: Payer: Self-pay

## 2020-03-01 NOTE — Progress Notes (Signed)
Never heard back from patient AWV Nurse will call her to r/s appt

## 2020-03-13 ENCOUNTER — Other Ambulatory Visit: Payer: Self-pay | Admitting: Family Medicine

## 2020-03-19 NOTE — Progress Notes (Addendum)
Subjective:   [name] [name] is a 74 y.o. female who presents for Medicare Annual (Subsequent) preventive examination.  I connected with [name] [name] today by telephone and verified that I am speaking with the correct person using two identifiers. Location patient: home Location provider: work Persons participating in the virtual visit: patient, provider.   I discussed the limitations, risks, security and privacy concerns of performing an evaluation and management service by telephone and the availability of in person appointments. I also discussed with the patient that there may be a patient responsible charge related to this service. The patient expressed understanding and verbally consented to this telephonic visit.    Interactive audio and video telecommunications were attempted between this provider and patient, however failed, due to patient having technical difficulties OR patient did not have access to video capability.  We continued and completed visit with audio only.  Review of Systems    N/A  Cardiac Risk Factors include: advanced age (>90men, >18 women);hypertension;dyslipidemia     Objective:    There were no vitals filed for this visit. There is no height or weight on file to calculate BMI.  Advanced Directives 03/20/2020 02/22/2019 08/04/2018 02/16/2018 02/20/2016  Does Patient Have a Medical Advance Directive? No No No No No  Would patient like information on creating a medical advance directive? No - Patient declined No - Patient declined - Yes (MAU/Ambulatory/Procedural Areas - Information given) -    Current Medications (verified) Outpatient Encounter Medications as of 03/20/2020  Medication Sig  . amLODipine (NORVASC) 5 MG tablet Take 1 tablet (5 mg total) by mouth daily.  Marland Kitchen aspirin 81 MG tablet Take 81 mg by mouth every other day.   Marland Kitchen atorvastatin (LIPITOR) 40 MG tablet TAKE 1 TABLET EVERY DAY  . Biotin (BIOTIN MAXIMUM STRENGTH) 10 MG TABS Take 10 mg by mouth daily.    . Cholecalciferol 125 MCG (5000 UT) TABS Take 1 tablet by mouth daily.  . metoprolol succinate (TOPROL-XL) 25 MG 24 hr tablet TAKE 1 TABLET BY MOUTH DAILY.  . MULTIPLE VITAMINS-MINERALS ER PO Take by mouth daily.    No facility-administered encounter medications on file as of 03/20/2020.    Allergies (verified) Patient has no known allergies.   History: Past Medical History:  Diagnosis Date  . Chest pain, unspecified   . H/O left breast biopsy 12/11/2005   [facility], path report matched the clinical impression of left breast lipoma  . HLD (hyperlipidemia)    mixed  . HTN (hypertension)    Past Surgical History:  Procedure Laterality Date  . ABDOMINAL HYSTERECTOMY  2007   total  . BREAST BIOPSY Left 2007   Dr. Dwyane Luo office-benign  . COLONOSCOPY  11/12/2001   multiple sessile polyps found in rectum, 32mm in size, distributed diffusely, biopsies were taken. The polyps extended from lower rectum to approximately 20 cm. They were confluent in the area between 10-15cm. Small polyps with central ulceration were noted.  . COLONOSCOPY WITH PROPOFOL N/A 08/04/2018   Procedure: COLONOSCOPY WITH PROPOFOL;  Surgeon: Robert Bellow, MD;  Location: [facility] ENDOSCOPY;  Service: Endoscopy;  Laterality: N/A;   Family History  Problem Relation Age of Onset  . Cancer Other        ovarian, breast, colon cancers  . Hypertension Mother   . Hyperlipidemia Mother   . Other Mother        enlarged heart  . Heart disease Father 69  . Diabetes Father   . Diabetes Sister   .  Hypertension Sister   . Kidney disease Sister   . Stroke Sister   . Heart disease Brother   . Hypertension Brother   . Diabetes Brother   . Healthy Brother   . Cancer Other        family hx  . Coronary artery disease Other        family hx  . Breast cancer Neg Hx    Social History   Socioeconomic History  . Marital status: Married    Spouse name: Herbie Baltimore   . Number of children: 5  . Years of education: HS  . Highest  education level: 12th grade  Occupational History  . Occupation: retired  Tobacco Use  . Smoking status: Never Smoker  . Smokeless tobacco: Never Used  . Tobacco comment: tobacco use - no  Vaping Use  . Vaping Use: Never used  Substance and Sexual Activity  . Alcohol use: No  . Drug use: No  . Sexual activity: Not on file  Other Topics Concern  . Not on file  Social History Narrative   Married, full time, exercise - walks some.   Social Determinants of Health   Financial Resource Strain: Low Risk   . Difficulty of Paying Living Expenses: Not hard at all  Food Insecurity: No Food Insecurity  . Worried About Charity fundraiser in the Last Year: Never true  . Ran Out of Food in the Last Year: Never true  Transportation Needs: No Transportation Needs  . Lack of Transportation (Medical): No  . Lack of Transportation (Non-Medical): No  Physical Activity: Inactive  . Days of Exercise per Week: 0 days  . Minutes of Exercise per Session: 0 min  Stress: No Stress Concern Present  . Feeling of Stress : Only a little  Social Connections: Socially Integrated  . Frequency of Communication with Friends and Family: Three times a week  . Frequency of Social Gatherings with Friends and Family: Three times a week  . Attends Religious Services: More than 4 times per year  . Active Member of Clubs or Organizations: Yes  . Attends Archivist Meetings: 1 to 4 times per year  . Marital Status: Married    Tobacco Counseling Counseling given: Not Answered Comment: tobacco use - no   Clinical Intake:  Pre-visit preparation completed: Yes  Pain : No/denies pain     Nutritional Risks: None Diabetes: No  How often do you need to have someone help you when you read instructions, pamphlets, or other written materials from your doctor or pharmacy?: 1 - Never  Diabetic? No  Interpreter Needed?: No  Information entered by :: [facility], LPN   Activities of Daily Living In  your present state of health, do you have any difficulty performing the following activities: 03/20/2020  Hearing? N  Vision? N  Comment Wears eye glasses.  Difficulty concentrating or making decisions? N  Walking or climbing stairs? N  Dressing or bathing? N  Doing errands, shopping? N  Preparing Food and eating ? N  Using the Toilet? N  In the past six months, have you accidently leaked urine? Y  Comment Occasionally with urges.  Do you have problems with loss of bowel control? N  Managing your Medications? N  Managing your Finances? N  Housekeeping or managing your Housekeeping? N  Some recent data might be hidden    Patient Care Team: Birdie Sons, MD as PCP - General (Family Medicine) [place], [place] as Consulting  Physician  Indicate any recent Medical Services you may have received from other than [facility] providers in the past year (date may be approximate).     Assessment:   This is a routine wellness examination for Clarke.  Hearing/Vision screen No exam data present  Dietary issues and exercise activities discussed: Current Exercise Habits: The patient does not participate in regular exercise at present, Exercise limited by: None identified  Goals    . Exercise 3x per week (30 min per time)     Recommend to start exercising 3 days a week for at least 30 minutes at at time.       Depression Screen PHQ 2/9 Scores 03/20/2020 02/22/2019 02/16/2018 02/20/2017 02/20/2016  PHQ - 2 Score 0 0 0 0 0  PHQ- 9 Score - - - 1 -    Fall Risk Fall Risk  03/20/2020 02/22/2019 02/26/2018 02/20/2017 02/20/2016  Falls in the past year? 0 0 No No No  Number falls in past yr: 0 - - - -  Injury with Fall? 0 - - - -    Any stairs in or around the home? No  If so, are there any without handrails? N/A Home free of loose throw rugs in walkways, pet beds, electrical cords, etc? Yes  Adequate lighting in your home to reduce risk of falls? Yes   ASSISTIVE DEVICES UTILIZED TO  PREVENT FALLS:  Life alert? No  Use of a cane, walker or w/c? No  Grab bars in the bathroom? No  Shower chair or bench in shower? No  Elevated toilet seat or a handicapped toilet? No    Cognitive Function:      6CIT Screen 03/20/2020 02/22/2019 02/16/2018  What Year? 0 points 0 points 0 points  What month? 0 points 0 points 0 points  What time? 0 points 0 points 0 points  Count back from 20 0 points 0 points 0 points  Months in reverse 0 points 0 points 0 points  Repeat phrase 4 points 0 points 2 points  Total Score 4 0 2    Immunizations Immunization History  Administered Date(s) Administered  . Influenza, High Dose Seasonal PF 08/05/2014, 07/15/2017, 07/31/2018, 07/13/2019  . Pneumococcal Conjugate-13 12/23/2013  . Pneumococcal Polysaccharide-23 12/16/2010  . Td 10/06/2007    TDAP status: Due, Education has been provided regarding the importance of this vaccine. Advised may receive this vaccine at local pharmacy or Health Dept. Aware to provide a copy of the vaccination record if obtained from local pharmacy or Health Dept. Verbalized acceptance and understanding. Flu Vaccine status: Up to date Pneumococcal vaccine status: Up to date Covid-19 vaccine status: Completed vaccines  Qualifies for Shingles Vaccine? Yes   Zostavax completed No   Shingrix Completed?: No.    Education has been provided regarding the importance of this vaccine. Patient has been advised to call insurance company to determine out of pocket expense if they have not yet received this vaccine. Advised may also receive vaccine at local pharmacy or Health Dept. Verbalized acceptance and understanding.  Screening Tests Health Maintenance  Topic Date Due  . COVID-19 Vaccine (1) Never done  . TETANUS/TDAP  03/20/2021 (Originally 10/05/2017)  . INFLUENZA VACCINE  04/29/2020  . MAMMOGRAM  04/18/2021  . DEXA SCAN  03/27/2023  . COLONOSCOPY  08/05/2023  . Hepatitis C Screening  Completed  . PNA vac Low Risk  Adult  Completed    Health Maintenance  Health Maintenance Due  Topic Date Due  . COVID-19 Vaccine (1)  Never done    Colorectal cancer screening: Completed 08/04/18. Repeat every 5 years Mammogram status: Completed 04/19/19. Repeat every year. Ordered today. Bone Density status: Completed 03/26/18. Results reflect: Bone density results: OSTEOPENIA. Repeat every 5 years.  Lung Cancer Screening: (Low Dose CT Chest recommended if Age 24-80 years, 30 pack-year currently smoking OR have quit w/in 15years.) does not qualify.   Additional Screening:  Hepatitis C Screening: Up to date  Vision Screening: Recommended annual ophthalmology exams for early detection of glaucoma and other disorders of the eye. Is the patient up to date with their annual eye exam?  Yes  Who is the provider or what is the name of the office in which the patient attends annual eye exams? [facility] If pt is not established with a provider, would they like to be referred to a provider to establish care? No .   Dental Screening: Recommended annual dental exams for proper oral hygiene  Community Resource Referral / Chronic Care Management: CRR required this visit?  No   CCM required this visit? No     Plan:   I have personally reviewed and noted the following in the patient's chart:   . Medical and social history . Use of alcohol, tobacco or illicit drugs  . Current medications and supplements . Functional ability and status . Nutritional status . Physical activity . Advanced directives . List of other physicians . Hospitalizations, surgeries, and ER visits in previous 12 months . Vitals . Screenings to include cognitive, depression, and falls . Referrals and appointments  In addition, I have reviewed and discussed with patient certain preventive protocols, quality metrics, and best practice recommendations. A written personalized care plan for preventive services as well as general preventive  health recommendations were provided to patient.     Kineta Fudala March ARB, [place]   1/61/0960   Nurse Notes: Advised pt to bring in her COVID vaccine card to update record.

## 2020-03-20 ENCOUNTER — Other Ambulatory Visit: Payer: Self-pay

## 2020-03-20 ENCOUNTER — Ambulatory Visit (INDEPENDENT_AMBULATORY_CARE_PROVIDER_SITE_OTHER): Payer: Medicare HMO

## 2020-03-20 DIAGNOSIS — Z0000 Encounter for general adult medical examination without abnormal findings: Secondary | ICD-10-CM

## 2020-03-20 DIAGNOSIS — Z1231 Encounter for screening mammogram for malignant neoplasm of breast: Secondary | ICD-10-CM | POA: Diagnosis not present

## 2020-03-20 NOTE — Patient Instructions (Addendum)
[name] [name] , Thank you for taking time to come for your Medicare Wellness Visit. I appreciate your ongoing commitment to your health goals. Please review the following plan we discussed and let me know if I can assist you in the future.   Screening recommendations/referrals: Colonoscopy: Up to date, due 07/2023 Mammogram: Up to date, due 03/2020. Ordered today. Bone Density: Up to date, due 02/2023 Recommended yearly ophthalmology/optometry visit for glaucoma screening and checkup Recommended yearly dental visit for hygiene and checkup  Vaccinations: Influenza vaccine: Done 07/03/19 Pneumococcal vaccine: Completed series Tdap vaccine: Currently due. Pt declined today.  Shingles vaccine: Currently due. Pt declined today.    Advanced directives: Advance directive discussed with you today. Even though you declined this today please call our office should you change your mind and we can give you the proper paperwork for you to fill out.  Conditions/risks identified: Recommend to start walking 3 days a week for at least 30 minutes at a time.   Next appointment: 06/26/20 @ 2:00 PM with Dr Caryn Section. Declined scheduling an AWV for 2022.   Preventive Care 48 Years and Older, Female Preventive care refers to lifestyle choices and visits with your health care provider that can promote health and wellness. What does preventive care include?  A yearly physical exam. This is also called an annual well check.  Dental exams once or twice a year.  Routine eye exams. Ask your health care provider how often you should have your eyes checked.  Personal lifestyle choices, including:  Daily care of your teeth and gums.  Regular physical activity.  Eating a healthy diet.  Avoiding tobacco and drug use.  Limiting alcohol use.  Practicing safe sex.  Taking low-dose aspirin every day.  Taking vitamin and mineral supplements as recommended by your health care provider. What happens during an annual  well check? The services and screenings done by your health care provider during your annual well check will depend on your age, overall health, lifestyle risk factors, and family history of disease. Counseling  Your health care provider may ask you questions about your:  Alcohol use.  Tobacco use.  Drug use.  Emotional well-being.  Home and relationship well-being.  Sexual activity.  Eating habits.  History of falls.  Memory and ability to understand (cognition).  Work and work Statistician.  Reproductive health. Screening  You may have the following tests or measurements:  Height, weight, and BMI.  Blood pressure.  Lipid and cholesterol levels. These may be checked every 5 years, or more frequently if you are over 60 years old.  Skin check.  Lung cancer screening. You may have this screening every year starting at age 45 if you have a 30-pack-year history of smoking and currently smoke or have quit within the past 15 years.  Fecal occult blood test (FOBT) of the stool. You may have this test every year starting at age 40.  Flexible sigmoidoscopy or colonoscopy. You may have a sigmoidoscopy every 5 years or a colonoscopy every 10 years starting at age 41.  Hepatitis C blood test.  Hepatitis B blood test.  Sexually transmitted disease (STD) testing.  Diabetes screening. This is done by checking your blood sugar (glucose) after you have not eaten for a while (fasting). You may have this done every 1-3 years.  Bone density scan. This is done to screen for osteoporosis. You may have this done starting at age 79.  Mammogram. This may be done every 1-2 years. Talk to your  health care provider about how often you should have regular mammograms. Talk with your health care provider about your test results, treatment options, and if necessary, the need for more tests. Vaccines  Your health care provider may recommend certain vaccines, such as:  Influenza vaccine. This  is recommended every year.  Tetanus, diphtheria, and acellular pertussis (Tdap, Td) vaccine. You may need a Td booster every 10 years.  Zoster vaccine. You may need this after age 60.  Pneumococcal 13-valent conjugate (PCV13) vaccine. One dose is recommended after age 40.  Pneumococcal polysaccharide (PPSV23) vaccine. One dose is recommended after age 77. Talk to your health care provider about which screenings and vaccines you need and how often you need them. This information is not intended to replace advice given to you by your health care provider. Make sure you discuss any questions you have with your health care provider. Document Released: 10/12/2015 Document Revised: 06/04/2016 Document Reviewed: 07/17/2015 Elsevier Interactive Patient Education  2017 [place] Prevention in the Home Falls can cause injuries. They can happen to people of all ages. There are many things you can do to make your home safe and to help prevent falls. What can I do on the outside of my home?  Regularly fix the edges of walkways and driveways and fix any cracks.  Remove anything that might make you trip as you walk through a door, such as a raised step or threshold.  Trim any bushes or trees on the path to your home.  Use bright outdoor lighting.  Clear any walking paths of anything that might make someone trip, such as rocks or tools.  Regularly check to see if handrails are loose or broken. Make sure that both sides of any steps have handrails.  Any raised decks and porches should have guardrails on the edges.  Have any leaves, snow, or ice cleared regularly.  Use sand or salt on walking paths during winter.  Clean up any spills in your garage right away. This includes oil or grease spills. What can I do in the bathroom?  Use night lights.  Install grab bars by the toilet and in the tub and shower. Do not use towel bars as grab bars.  Use non-skid mats or decals in the tub or  shower.  If you need to sit down in the shower, use a plastic, non-slip stool.  Keep the floor dry. Clean up any water that spills on the floor as soon as it happens.  Remove soap buildup in the tub or shower regularly.  Attach bath mats securely with double-sided non-slip rug tape.  Do not have throw rugs and other things on the floor that can make you trip. What can I do in the bedroom?  Use night lights.  Make sure that you have a light by your bed that is easy to reach.  Do not use any sheets or blankets that are too big for your bed. They should not hang down onto the floor.  Have a firm chair that has side arms. You can use this for support while you get dressed.  Do not have throw rugs and other things on the floor that can make you trip. What can I do in the kitchen?  Clean up any spills right away.  Avoid walking on wet floors.  Keep items that you use a lot in easy-to-reach places.  If you need to reach something above you, use a strong step stool that has a  grab bar.  Keep electrical cords out of the way.  Do not use floor polish or wax that makes floors slippery. If you must use wax, use non-skid floor wax.  Do not have throw rugs and other things on the floor that can make you trip. What can I do with my stairs?  Do not leave any items on the stairs.  Make sure that there are handrails on both sides of the stairs and use them. Fix handrails that are broken or loose. Make sure that handrails are as long as the stairways.  Check any carpeting to make sure that it is firmly attached to the stairs. Fix any carpet that is loose or worn.  Avoid having throw rugs at the top or bottom of the stairs. If you do have throw rugs, attach them to the floor with carpet tape.  Make sure that you have a light switch at the top of the stairs and the bottom of the stairs. If you do not have them, ask someone to add them for you. What else can I do to help prevent  falls?  Wear shoes that:  Do not have high heels.  Have rubber bottoms.  Are comfortable and fit you well.  Are closed at the toe. Do not wear sandals.  If you use a stepladder:  Make sure that it is fully opened. Do not climb a closed stepladder.  Make sure that both sides of the stepladder are locked into place.  Ask someone to hold it for you, if possible.  Clearly mark and make sure that you can see:  Any grab bars or handrails.  First and last steps.  Where the edge of each step is.  Use tools that help you move around (mobility aids) if they are needed. These include:  Canes.  Walkers.  Scooters.  Crutches.  Turn on the lights when you go into a dark area. Replace any light bulbs as soon as they burn out.  Set up your furniture so you have a clear path. Avoid moving your furniture around.  If any of your floors are uneven, fix them.  If there are any pets around you, be aware of where they are.  Review your medicines with your doctor. Some medicines can make you feel dizzy. This can increase your chance of falling. Ask your doctor what other things that you can do to help prevent falls. This information is not intended to replace advice given to you by your health care provider. Make sure you discuss any questions you have with your health care provider. Document Released: 07/12/2009 Document Revised: 02/21/2016 Document Reviewed: 10/20/2014 Elsevier Interactive Patient Education  2017 [employer].

## 2020-04-20 ENCOUNTER — Ambulatory Visit
Admission: RE | Admit: 2020-04-20 | Discharge: 2020-04-20 | Disposition: A | Discharge status: Home / Self Care | Payer: Medicare HMO | Source: Ambulatory Visit | Attending: Family Medicine | Admitting: Family Medicine

## 2020-04-20 DIAGNOSIS — Z1231 Encounter for screening mammogram for malignant neoplasm of breast: Secondary | ICD-10-CM | POA: Diagnosis not present

## 2020-05-24 ENCOUNTER — Other Ambulatory Visit: Payer: Self-pay | Admitting: Family Medicine

## 2020-06-25 ENCOUNTER — Telehealth: Payer: Self-pay

## 2020-06-25 DIAGNOSIS — M8580 Other specified disorders of bone density and structure, unspecified site: Secondary | ICD-10-CM

## 2020-06-25 DIAGNOSIS — I10 Essential (primary) hypertension: Secondary | ICD-10-CM

## 2020-06-25 DIAGNOSIS — E7800 Pure hypercholesterolemia, unspecified: Secondary | ICD-10-CM

## 2020-06-25 NOTE — Telephone Encounter (Signed)
Future orders placed, she can go straight to lab in the morning. Needs to fast.

## 2020-06-25 NOTE — Telephone Encounter (Signed)
Patient advised.

## 2020-06-25 NOTE — Telephone Encounter (Signed)
Copied from [place] [id number]. Topic: Quick Communication - See Telephone Encounter >> Jun 25, 2020  9:57 AM Loma Boston wrote: CRM for notification. See Telephone encounter for: 06/25/20.pt is having CPE tomorrow, wants DR F to go ahead and put in order so she can do labs first thing in the morning

## 2020-06-26 ENCOUNTER — Other Ambulatory Visit: Payer: Self-pay

## 2020-06-26 ENCOUNTER — Encounter: Payer: Self-pay | Admitting: Family Medicine

## 2020-06-26 ENCOUNTER — Ambulatory Visit (INDEPENDENT_AMBULATORY_CARE_PROVIDER_SITE_OTHER): Payer: Medicare HMO | Admitting: Family Medicine

## 2020-06-26 VITALS — BP 105/71 | HR 87 | Temp 98.8°F | Ht 67.0 in | Wt 192.0 lb

## 2020-06-26 DIAGNOSIS — E7800 Pure hypercholesterolemia, unspecified: Secondary | ICD-10-CM

## 2020-06-26 DIAGNOSIS — Z23 Encounter for immunization: Secondary | ICD-10-CM | POA: Diagnosis not present

## 2020-06-26 DIAGNOSIS — M8580 Other specified disorders of bone density and structure, unspecified site: Secondary | ICD-10-CM

## 2020-06-26 DIAGNOSIS — Z0000 Encounter for general adult medical examination without abnormal findings: Secondary | ICD-10-CM | POA: Diagnosis not present

## 2020-06-26 DIAGNOSIS — I10 Essential (primary) hypertension: Secondary | ICD-10-CM | POA: Diagnosis not present

## 2020-06-26 DIAGNOSIS — I779 Disorder of arteries and arterioles, unspecified: Secondary | ICD-10-CM | POA: Diagnosis not present

## 2020-06-26 DIAGNOSIS — E559 Vitamin D deficiency, unspecified: Secondary | ICD-10-CM | POA: Diagnosis not present

## 2020-06-26 MED ORDER — AMLODIPINE BESYLATE 2.5 MG PO TABS: 2.5000 mg | ORAL_TABLET | Freq: Every day | ORAL | 3 refills | Status: DC

## 2020-06-26 NOTE — Patient Instructions (Addendum)
.   Please review the attached list of medications and notify my office if there are any errors.   . Please bring all of your medications to every appointment so we can make sure that our medication list is the same as yours.   . You are due for a Tdap (tetanus-diptheria-pertussis vaccine) which protects you from tetanus and whooping cough. Please check with your insurance plan or pharmacy regarding coverage for this vaccine.     You can use OTC ear wax drops 2-3 nights a week to keep wax from building up   You can break amlodipine tablets in half until the new prescription for 2.5 mg tablets arrive.

## 2020-06-26 NOTE — Progress Notes (Signed)
Annual Physical    Patient: [name] [name], Female    DOB: Feb 14, 1946, 74 y.o.   MRN: [id number] Visit Date: 06/26/2020  Today's Provider: Lelon Huh, MD   Chief Complaint  Patient presents with  . Annual Exam   Subjective    [name] is a 74 y.o. female who presents today for her Annual Physical. She reports consuming a low sodium diet. The patient does not participate in regular exercise at present. She generally feels well. She reports sleeping well. She does not have additional problems to discuss today.   HPI   She is doing well without complaints. Her BP has been running low, sometimes in the 100s when she feels a little light headed. Otherwise feels well. No trouble with PVCs or racing heart.   Social History   Tobacco Use  . Smoking status: Never Smoker  . Smokeless tobacco: Never Used  . Tobacco comment: tobacco use - no  Vaping Use  . Vaping Use: Never used  Substance Use Topics  . Alcohol use: No  . Drug use: No       Medications: Outpatient Medications Prior to Visit  Medication Sig  . amLODipine (NORVASC) 5 MG tablet Take 1 tablet (5 mg total) by mouth daily.  Marland Kitchen aspirin 81 MG tablet Take 81 mg by mouth every other day.   Marland Kitchen atorvastatin (LIPITOR) 40 MG tablet TAKE 1 TABLET EVERY DAY  . Biotin (BIOTIN MAXIMUM STRENGTH) 10 MG TABS Take 10 mg by mouth daily.   . Cholecalciferol 125 MCG (5000 UT) TABS Take 1 tablet by mouth daily.  . metoprolol succinate (TOPROL-XL) 25 MG 24 hr tablet TAKE 1 TABLET BY MOUTH DAILY.  . MULTIPLE VITAMINS-MINERALS ER PO Take by mouth daily.    No facility-administered medications prior to visit.    No Known Allergies  Patient Care Team: Birdie Sons, MD as PCP - General (Family Medicine) [place], [place] as Consulting Physician  Review of Systems  Constitutional: Negative.   HENT: Positive for hearing loss and tinnitus.   Eyes: Positive for itching.  Respiratory: Negative.   Cardiovascular:  Negative.   Gastrointestinal: Negative.   Endocrine: Negative.   Genitourinary: Negative.   Musculoskeletal: Negative.   Skin: Negative.   Allergic/Immunologic: Negative.   Neurological: Positive for light-headedness.  Hematological: Bruises/bleeds easily.  Psychiatric/Behavioral: Negative.       Objective    Vitals: BP 105/71 (BP Location: Right Arm, Patient Position: Sitting, Cuff Size: Large)   Pulse 87   Temp 98.8 F (37.1 C) (Oral)   Ht 5\' 7"  (1.702 m)   Wt 192 lb (87.1 kg)   BMI 30.07 kg/m    Physical Exam  General Appearance:    Obese female. Alert, cooperative, in no acute distress, appears stated age   Head:    Normocephalic, without obvious abnormality, atraumatic  Eyes:    PERRL, conjunctiva/corneas clear, EOM's intact, fundi    benign, both eyes  Ears:    Normal TM's and external ear canals. Impaction right ear canal.   Nose:   Nares normal, septum midline, mucosa normal, no drainage    or sinus tenderness  Throat:   Lips, mucosa, and tongue normal; teeth and gums normal  Neck:   Supple, symmetrical, trachea midline, no adenopathy;    thyroid:  no enlargement/tenderness/nodules; no carotid   bruit or JVD  Back:     Symmetric, no curvature, ROM normal, no CVA tenderness  Lungs:  Clear to auscultation bilaterally, respirations unlabored  Chest Wall:    No tenderness or deformity   Heart:    Normal heart rate. Normal rhythm. No murmurs, rubs, or gallops.   Breast Exam:    deferred  Abdomen:     Soft, non-tender, bowel sounds active all four quadrants,    no masses, no organomegaly  Pelvic:    deferred and not indicated; status post hysterectomy, negative ROS  Extremities:   All extremities are intact. No cyanosis or edema  Pulses:   2+ and symmetric all extremities  Skin:   Skin color, texture, turgor normal, no rashes or lesions  Lymph nodes:   Cervical, supraclavicular, and axillary nodes normal  Neurologic:   CNII-XII intact, normal strength, sensation  and reflexes    throughout     Most recent functional status assessment: In your present state of health, do you have any difficulty performing the following activities: 03/20/2020  Hearing? N  Vision? N  Comment Wears eye glasses.  Difficulty concentrating or making decisions? N  Walking or climbing stairs? N  Dressing or bathing? N  Doing errands, shopping? N  Preparing Food and eating ? N  Using the Toilet? N  In the past six months, have you accidently leaked urine? Y  Comment Occasionally with urges.  Do you have problems with loss of bowel control? N  Managing your Medications? N  Managing your Finances? N  Housekeeping or managing your Housekeeping? N  Some recent data might be hidden   Most recent fall risk assessment: Fall Risk  03/20/2020  Falls in the past year? 0  Number falls in past yr: 0  Injury with Fall? 0    Most recent depression screenings: PHQ 2/9 Scores 03/20/2020 02/22/2019  PHQ - 2 Score 0 0  PHQ- 9 Score - -   Most recent cognitive screening: 6CIT Screen 03/20/2020  What Year? 0 points  What month? 0 points  What time? 0 points  Count back from 20 0 points  Months in reverse 0 points  Repeat phrase 4 points  Total Score 4   Most recent Audit-C alcohol use screening Alcohol Use Disorder Test (AUDIT) 03/20/2020  1. How often do you have a drink containing alcohol? 0  2. How many drinks containing alcohol do you have on a typical day when you are drinking? 0  3. How often do you have six or more drinks on one occasion? 0  AUDIT-C Score 0  Alcohol Brief Interventions/Follow-up AUDIT Score <7 follow-up not indicated   A score of 3 or more in women, and 4 or more in men indicates increased risk for alcohol abuse, EXCEPT if all of the points are from question 1   No results found for any visits on 06/26/20.  Assessment & Plan     Annual physical done today including the all of the following: Reviewed patient's Family Medical History Reviewed and  updated list of patient's medical providers Assessment of cognitive impairment was done Assessed patient's functional ability Established a written schedule for health screening [place] Completed and Reviewed  Exercise Activities and Dietary recommendations Goals    . Exercise 3x per week (30 min per time)     Recommend to start exercising 3 days a week for at least 30 minutes at at time.        Immunization History  Administered Date(s) Administered  . Influenza, High Dose Seasonal PF 08/05/2014, 07/15/2017, 07/31/2018, 07/13/2019  . Pneumococcal Conjugate-13 12/23/2013  .  Pneumococcal Polysaccharide-23 12/16/2010  . Td 10/06/2007    Health Maintenance  Topic Date Due  . COVID-19 Vaccine (1) Never done  . INFLUENZA VACCINE  04/29/2020  . TETANUS/TDAP  03/20/2021 (Originally 10/05/2017)  . DEXA SCAN  03/27/2023  . COLONOSCOPY  08/05/2023  . Hepatitis C Screening  Completed  . PNA vac Low Risk Adult  Completed     Discussed health benefits of physical activity, and encouraged her to engage in regular exercise appropriate for her age and condition.    1. Annual physical exam Doing very well , mildly obese. Recommended regular exercise and healthy eating habits.  - EKG 12-Lead  2. Mild carotid artery disease (Max) Doing well on statin and low dose ECASA.   3. Hypercholesteremia She is tolerating atorvastatin well with no adverse effects.    4. Essential hypertension Has been borderline hypotensive and mildly symptomatic. Will reduce from 5mg  to - amLODipine (NORVASC) 2.5 MG tablet; Take 1 tablet (2.5 mg total) by mouth daily.  Dispense: 90 tablet; Refill: 3 - EKG 12-Lead  5. Need for immunization against influenza  - Flu Vaccine QUAD High Dose(Fluad)           Lelon Huh, MD  [facility] [phone] (phone) [phone] (fax)  [place]

## 2020-06-27 LAB — LIPID PANEL
Chol/HDL Ratio: 2.8 ratio (ref 0.0–4.4)
Cholesterol, Total: 186 mg/dL (ref 100–199)
HDL: 66 mg/dL (ref >39)
LDL Chol Calc (NIH): 104 mg/dL — ABNORMAL HIGH (ref 0–99)
Triglycerides: 88 mg/dL (ref 0–149)
VLDL Cholesterol Cal: 16 mg/dL (ref 5–40)

## 2020-06-27 LAB — COMPREHENSIVE METABOLIC PANEL
ALT: 22 IU/L (ref 0–32)
AST: 17 IU/L (ref 0–40)
Albumin/Globulin Ratio: 1.4 (ref 1.2–2.2)
Albumin: 4.1 g/dL (ref 3.7–4.7)
Alkaline Phosphatase: 100 IU/L (ref 44–121)
BUN/Creatinine Ratio: 17 (ref 12–28)
BUN: 13 mg/dL (ref 8–27)
Bilirubin Total: 0.4 mg/dL (ref 0.0–1.2)
CO2: 22 mmol/L (ref 20–29)
Calcium: 9.5 mg/dL (ref 8.7–10.3)
Chloride: 105 mmol/L (ref 96–106)
Creatinine, Ser: 0.76 mg/dL (ref 0.57–1.00)
GFR calc Af Amer: 89 mL/min/{1.73_m2} (ref >59)
GFR calc non Af Amer: 77 mL/min/{1.73_m2} (ref >59)
Globulin, Total: 3 g/dL (ref 1.5–4.5)
Glucose: 89 mg/dL (ref 65–99)
Potassium: 4 mmol/L (ref 3.5–5.2)
Sodium: 141 mmol/L (ref 134–144)
Total Protein: 7.1 g/dL (ref 6.0–8.5)

## 2020-06-27 LAB — CBC
Hematocrit: 37.1 % (ref 34.0–46.6)
Hemoglobin: 12.8 g/dL (ref 11.1–15.9)
MCH: 31.6 pg (ref 26.6–33.0)
MCHC: 34.5 g/dL (ref 31.5–35.7)
MCV: 92 fL (ref 79–97)
Platelets: 217 10*3/uL (ref 150–450)
RBC: 4.05 x10E6/uL (ref 3.77–5.28)
RDW: 12.7 % (ref 11.7–15.4)
WBC: 3.3 10*3/uL — ABNORMAL LOW (ref 3.4–10.8)

## 2020-06-27 LAB — TSH: TSH: 0.991 u[IU]/mL (ref 0.450–4.500)

## 2020-06-27 LAB — VITAMIN D 25 HYDROXY (VIT D DEFICIENCY, FRACTURES): Vit D, 25-Hydroxy: 47.6 ng/mL (ref 30.0–100.0)

## 2020-06-29 ENCOUNTER — Telehealth: Payer: Self-pay

## 2020-06-29 NOTE — Telephone Encounter (Signed)
Patient advised of lab results via voicemail.

## 2020-06-29 NOTE — Telephone Encounter (Signed)
-----   Message from Birdie Sons, MD sent at 06/27/2020  9:41 AM EDT ----- Labs all normal. Continue current medications.  Check yearly

## 2020-07-23 ENCOUNTER — Other Ambulatory Visit: Payer: Self-pay | Admitting: Family Medicine

## 2020-07-23 DIAGNOSIS — H5213 Myopia, bilateral: Secondary | ICD-10-CM | POA: Diagnosis not present

## 2021-01-09 ENCOUNTER — Other Ambulatory Visit: Payer: Self-pay | Admitting: Family Medicine

## 2021-02-20 ENCOUNTER — Other Ambulatory Visit: Payer: Self-pay

## 2021-02-20 ENCOUNTER — Encounter: Payer: Self-pay | Admitting: Family Medicine

## 2021-02-20 ENCOUNTER — Ambulatory Visit (INDEPENDENT_AMBULATORY_CARE_PROVIDER_SITE_OTHER): Payer: Medicare HMO | Admitting: Family Medicine

## 2021-02-20 VITALS — BP 138/81 | HR 74 | Temp 97.9°F | Resp 16 | Wt 182.0 lb

## 2021-02-20 DIAGNOSIS — R229 Localized swelling, mass and lump, unspecified: Secondary | ICD-10-CM | POA: Diagnosis not present

## 2021-02-20 NOTE — Patient Instructions (Signed)
.   Please review the attached list of medications and notify my office if there are any errors.   . Please bring all of your medications to every appointment so we can make sure that our medication list is the same as yours.   

## 2021-02-20 NOTE — Progress Notes (Signed)
Established patient visit   Patient: [name] [name]   DOB: 1946-05-08   75 y.o. Female  MRN: [id number] Visit Date: 02/20/2021  Today's healthcare provider: Lelon Huh, MD   Chief Complaint  Patient presents with  . Abdominal Pain  . Hypertension   Subjective    HPI  Pelvic mass: Patient complains of a firm mass in the right lower quadrant above the pubic area. She first noticed the mass a few weeks ago and is unchanged in size. She denies any pain, nausea, vomiting, diarrhea or constipation.   Hypertension, follow-up  BP Readings from Last 3 Encounters:  02/20/21 138/81  06/26/20 105/71  09/07/19 132/80   Wt Readings from Last 3 Encounters:  02/20/21 182 lb (82.6 kg)  06/26/20 192 lb (87.1 kg)  09/07/19 197 lb (89.4 kg)     She was last seen for hypertension 7 months ago.  BP at that visit was 105/71. Management since that visit includes reducing Amlodipine from 5mg  to 2.5mg  daily.  She reports good compliance with treatment. She is not having side effects.  She is following a Regular diet. She is not exercising. She does not smoke.  Use of agents associated with hypertension: NSAIDS.   Outside blood pressures are fluctuating. Systolic reading has been as high as 140, and as low as 99. She reports having occasional headache and lightheadedness.    Symptoms: No chest pain No chest pressure  No palpitations No syncope  No dyspnea No orthopnea  No paroxysmal nocturnal dyspnea No lower extremity edema   Pertinent labs: Lab Results  Component Value Date   CHOL 186 06/26/2020   HDL 66 06/26/2020   LDLCALC 104 (H) 06/26/2020   TRIG 88 06/26/2020   CHOLHDL 2.8 06/26/2020   Lab Results  Component Value Date   NA 141 06/26/2020   K 4.0 06/26/2020   CREATININE 0.76 06/26/2020   GFRNONAA 77 06/26/2020   GFRAA 89 06/26/2020   GLUCOSE 89 06/26/2020     The 10-year ASCVD risk score Mikey Bussing DC Jr., et al., 2013) is: 17.1%    ---------------------------------------------------------------------------------------------------     Medications: Outpatient Medications Prior to Visit  Medication Sig  . amLODipine (NORVASC) 2.5 MG tablet Take 1 tablet (2.5 mg total) by mouth daily.  Marland Kitchen aspirin 81 MG tablet Take 81 mg by mouth every other day.   Marland Kitchen atorvastatin (LIPITOR) 40 MG tablet TAKE 1 TABLET EVERY DAY  . Biotin 10 MG TABS Take 10 mg by mouth daily.   . Cholecalciferol 125 MCG (5000 UT) TABS Take 1 tablet by mouth daily.  . metoprolol succinate (TOPROL-XL) 25 MG 24 hr tablet TAKE 1 TABLET EVERY DAY  . MULTIPLE VITAMINS-MINERALS ER PO Take by mouth daily.    No facility-administered medications prior to visit.    Review of Systems  Constitutional: Negative for appetite change, chills, fatigue and fever.  Respiratory: Negative for chest tightness and shortness of breath.   Cardiovascular: Negative for chest pain and palpitations.  Gastrointestinal: Positive for abdominal distention (abdominal mass). Negative for abdominal pain, nausea and vomiting.  Neurological: Positive for light-headedness and headaches. Negative for dizziness and weakness.       Objective    BP 138/81 (BP Location: Left Arm, Patient Position: Sitting)   Pulse 74   Temp 97.9 F (36.6 C) (Temporal)   Resp 16   Wt 182 lb (82.6 kg)   BMI 28.51 kg/m     Physical Exam  Vague, but firm tender  diffuse suprapubic mass just to the right of midline. No open wounds, erythema. Or discharge.    No results found for any visits on 02/20/21.  Assessment & Plan     1. Mass of subcutaneous tissue  - US Pelvis Limited; Future        The entirety of the information documented in the History of Present Illness, Review of Systems and Physical Exam were personally obtained by me. Portions of this information were initially documented by the CMA and reviewed by me for thoroughness and accuracy.      Lelon Huh, MD  [facility] [phone] (phone) [phone] (fax)  [place]

## 2021-03-06 ENCOUNTER — Ambulatory Visit: Payer: Medicare HMO | Admitting: Family Medicine

## 2021-03-14 ENCOUNTER — Other Ambulatory Visit: Payer: Self-pay | Admitting: Family Medicine

## 2021-03-18 ENCOUNTER — Other Ambulatory Visit: Payer: Self-pay | Admitting: Family Medicine

## 2021-03-26 ENCOUNTER — Telehealth: Payer: Self-pay

## 2021-03-26 NOTE — Telephone Encounter (Signed)
Copied from [place] [id number]. Topic: General - Inquiry >> Mar 26, 2021 12:08 PM Oneta Rack wrote: Osvaldo Human name: Nyra Jabs  Relation to pt: [place] Department  Call back number: [phone] ext [phone]    Reason for call: Patient has [facility] and authorization is needed for US of the pelvis

## 2021-03-26 NOTE — Telephone Encounter (Signed)
Please review message below. Patients insurance needs authorization for ordered US.

## 2021-03-27 NOTE — Telephone Encounter (Signed)
[name] [name] is not needed through [facility] for a pelvic ultrasound.Kaylee from [place] has been advised of this

## 2021-03-27 NOTE — Telephone Encounter (Signed)
Patient has been advised. KW 

## 2021-03-27 NOTE — Telephone Encounter (Signed)
Patient called in to inquire of the authorization that was to be done for her to have her appointment on 03/28/21 say that she can not be seen without this authorization Please call patient at Ph# [phone]

## 2021-03-28 ENCOUNTER — Ambulatory Visit: Payer: Medicare HMO

## 2021-04-11 ENCOUNTER — Ambulatory Visit
Admission: RE | Admit: 2021-04-11 | Discharge: 2021-04-11 | Disposition: A | Discharge status: Home / Self Care | Payer: Medicare HMO | Source: Ambulatory Visit | Attending: Family Medicine | Admitting: Family Medicine

## 2021-04-11 ENCOUNTER — Other Ambulatory Visit: Payer: Self-pay

## 2021-04-11 DIAGNOSIS — R229 Localized swelling, mass and lump, unspecified: Secondary | ICD-10-CM | POA: Insufficient documentation

## 2021-04-11 DIAGNOSIS — R1900 Intra-abdominal and pelvic swelling, mass and lump, unspecified site: Secondary | ICD-10-CM | POA: Diagnosis not present

## 2021-04-15 ENCOUNTER — Other Ambulatory Visit: Payer: Self-pay

## 2021-04-15 DIAGNOSIS — R229 Localized swelling, mass and lump, unspecified: Secondary | ICD-10-CM

## 2021-04-15 DIAGNOSIS — K469 Unspecified abdominal hernia without obstruction or gangrene: Secondary | ICD-10-CM

## 2021-04-15 NOTE — Progress Notes (Signed)
Order for urgent referral placed to surgery. Patient was advised.

## 2021-05-07 ENCOUNTER — Telehealth: Payer: Self-pay

## 2021-05-07 ENCOUNTER — Telehealth: Payer: Self-pay | Admitting: Family Medicine

## 2021-05-07 DIAGNOSIS — Z1231 Encounter for screening mammogram for malignant neoplasm of breast: Secondary | ICD-10-CM

## 2021-05-07 NOTE — Telephone Encounter (Signed)
Disregard

## 2021-05-07 NOTE — Telephone Encounter (Signed)
Copied from [place] [phone]. Topic: Referral - Request for Referral >> May 07, 2021  3:37 PM Erick Blinks wrote: Has patient seen PCP for this complaint? No. *If NO, is insurance requiring patient see PCP for this issue before PCP can refer them? Referral for which specialty: Mammography Preferred provider/office: [facility]  Reason for referral: Due

## 2021-05-08 ENCOUNTER — Other Ambulatory Visit: Payer: Self-pay

## 2021-05-08 ENCOUNTER — Encounter: Payer: Self-pay | Admitting: Surgery

## 2021-05-08 ENCOUNTER — Ambulatory Visit: Payer: Medicare HMO | Admitting: Surgery

## 2021-05-08 VITALS — BP 152/91 | HR 81 | Temp 98.8°F | Ht 67.0 in | Wt 176.6 lb

## 2021-05-08 DIAGNOSIS — K4090 Unilateral inguinal hernia, without obstruction or gangrene, not specified as recurrent: Secondary | ICD-10-CM | POA: Diagnosis not present

## 2021-05-08 NOTE — Patient Instructions (Addendum)
You have chose to have your hernia repaired. This will be done by Dr. Hampton Abbot at [facility].  Just call us if you would like to proceed with surgery. We will have you return for an updated visit with Dr Hampton Abbot to go over all information.   We will get medical clearance from your primary care provider.        You would need to stop your Aspirin 5 days prior to surgery.    You will need to arrange to be out of work for 2 weeks and then return with a lifting restrictions for 4 more weeks. Please send any FMLA paperwork prior to surgery and we will fill this out and fax it back to your employer within 3 business days.  You may have a bruise in your groin and also swelling and brusing in your testicle area. You may use ice 4-5 times daily for 15-20 minutes each time. Make sure that you place a barrier between you and the ice pack. To decrease the swelling, you may roll up a bath towel and place it vertically in between your thighs with your testicles resting on the towel. You will want to keep this area elevated as much as possible for several days following surgery.    Inguinal Hernia, Adult Muscles help keep everything in the body in its proper place. But if a weak spot in the muscles develops, something can poke through. That is called a hernia. When this happens in the lower part of the belly (abdomen), it is called an inguinal hernia. (It takes its name from a part of the body in this region called the inguinal canal.) A weak spot in the wall of muscles lets some fat or part of the small intestine bulge through. An inguinal hernia can develop at any age. Men get them more often than women. CAUSES  In adults, an inguinal hernia develops over time. It can be triggered by: Suddenly straining the muscles of the lower abdomen. Lifting heavy objects. Straining to have a bowel movement. Difficult bowel movements (constipation) can lead to this. Constant coughing. This may be caused by smoking or lung  disease. Being overweight. Being pregnant. Working at a job that requires long periods of standing or heavy lifting. Having had an inguinal hernia before. One type can be an emergency situation. It is called a strangulated inguinal hernia. It develops if part of the small intestine slips through the weak spot and cannot get back into the abdomen. The blood supply can be cut off. If that happens, part of the intestine may die. This situation requires emergency surgery. SYMPTOMS  Often, a small inguinal hernia has no symptoms. It is found when a healthcare provider does a physical exam. Larger hernias usually have symptoms.  In adults, symptoms may include: A lump in the groin. This is easier to see when the person is standing. It might disappear when lying down. In men, a lump in the scrotum. Pain or burning in the groin. This occurs especially when lifting, straining or coughing. A dull ache or feeling of pressure in the groin. Signs of a strangulated hernia can include: A bulge in the groin that becomes very painful and tender to the touch. A bulge that turns red or purple. Fever, nausea and vomiting. Inability to have a bowel movement or to pass gas. DIAGNOSIS  To decide if you have an inguinal hernia, a healthcare provider will probably do a physical examination. This will include asking questions about any  symptoms you have noticed. The healthcare provider might feel the groin area and ask you to cough. If an inguinal hernia is felt, the healthcare provider may try to slide it back into the abdomen. Usually no other tests are needed. TREATMENT  Treatments can vary. The size of the hernia makes a difference. Options include: Watchful waiting. This is often suggested if the hernia is small and you have had no symptoms. No medical procedure will be done unless symptoms develop. You will need to watch closely for symptoms. If any occur, contact your healthcare provider right  away. Surgery. This is used if the hernia is larger or you have symptoms. Open surgery. This is usually an outpatient procedure (you will not stay overnight in a hospital). An cut (incision) is made through the skin in the groin. The hernia is put back inside the abdomen. The weak area in the muscles is then repaired by herniorrhaphy or hernioplasty. Herniorrhaphy: in this type of surgery, the weak muscles are sewn back together. Hernioplasty: a patch or mesh is used to close the weak area in the abdominal wall. Laparoscopy. In this procedure, a surgeon makes small incisions. A thin tube with a tiny video camera (called a laparoscope) is put into the abdomen. The surgeon repairs the hernia with mesh by looking with the video camera and using two long instruments. HOME CARE INSTRUCTIONS  After surgery to repair an inguinal hernia: You will need to take pain medicine prescribed by your healthcare provider. Follow all directions carefully. You will need to take care of the wound from the incision. Your activity will be restricted for awhile. This will probably include no heavy lifting for several weeks. You also should not do anything too active for a few weeks. When you can return to work will depend on the type of job that you have. During "watchful waiting" periods, you should: Maintain a healthy weight. Eat a diet high in fiber (fruits, vegetables and whole grains). Drink plenty of fluids to avoid constipation. This means drinking enough water and other liquids to keep your urine clear or pale yellow. Do not lift heavy objects. Do not stand for long periods of time. Quit smoking. This should keep you from developing a frequent cough. SEEK MEDICAL CARE IF:  A bulge develops in your groin area. You feel pain, a burning sensation or pressure in the groin. This might be worse if you are lifting or straining. You develop a fever of more than 100.5 F (38.1 C). SEEK IMMEDIATE MEDICAL CARE IF:  Pain  in the groin increases suddenly. A bulge in the groin gets bigger suddenly and does not go down. For men, there is sudden pain in the scrotum. Or, the size of the scrotum increases. A bulge in the groin area becomes red or purple and is painful to touch. You have nausea or vomiting that does not go away. You feel your heart beating much faster than normal. You cannot have a bowel movement or pass gas. You develop a fever of more than 102.0 F (38.9 C).   This information is not intended to replace advice given to you by your health care provider. Make sure you discuss any questions you have with your health care provider.   Document Released: 02/01/2009 Document Revised: 12/08/2011 Document Reviewed: 03/19/2015 Elsevier Interactive Patient Education [employer].

## 2021-05-08 NOTE — Telephone Encounter (Signed)
Have placed order, she can call [place] at her convenience to schedule.

## 2021-05-08 NOTE — Telephone Encounter (Signed)
Please advise of ok to place mammogram order.

## 2021-05-08 NOTE — Progress Notes (Signed)
05/08/2021  Reason for Visit:  Right suprapubic hernia  Referring Provider:  Lelon Huh, MD  History of Present Illness: [name] [name] is a 75 y.o. female presenting with a new hernia in the right suprapubic region.  Patient reports that she's noticed an area of bulging since about March 2022.  She saw Dr. Caryn Section in May 2022 and ultrasound was done on 04/11/21, which showed a 2 cm fat-containing hernia.  There was concern for possible bowel involved in the hernia as well.  The patient reports that she's overall asymptomatic.  Denies any discomfort in the area, burning sensation, or pain.  Denies feeling it getting larger with coughing or straining.  Denies any nausea, vomiting, constipation, diarrhea, or blood in the stool.  Denies any other areas of bulging.  She has had a vaginal hysterectomy and tubal ligation.  She takes an Aspirin 81 mg daily.  Past Medical History: Past Medical History:  Diagnosis Date   Chest pain, unspecified    H/O left breast biopsy 12/11/2005   [facility], path report matched the clinical impression of left breast lipoma   HLD (hyperlipidemia)    mixed   HTN (hypertension)      Past Surgical History: Past Surgical History:  Procedure Laterality Date   ABDOMINAL HYSTERECTOMY  2007   total   BREAST BIOPSY Left 2007   Dr. Dwyane Luo office-benign   COLONOSCOPY  11/12/2001   multiple sessile polyps found in rectum, 61m in size, distributed diffusely, biopsies were taken. The polyps extended from lower rectum to approximately 20 cm. They were confluent in the area between 10-15cm. Small polyps with central ulceration were noted.   COLONOSCOPY WITH PROPOFOL N/A 08/04/2018   Procedure: COLONOSCOPY WITH PROPOFOL;  Surgeon: BRobert Bellow MD;  Location: [facility] ENDOSCOPY;  Service: Endoscopy;  Laterality: N/A;   TUBAL LIGATION      Home Medications: Prior to Admission medications   Medication Sig Start Date End Date Taking? Authorizing Provider  amLODipine  (NORVASC) 2.5 MG tablet Take 1 tablet (2.5 mg total) by mouth daily. 06/26/20  Yes FBirdie Sons MD  aspirin 81 MG tablet Take 81 mg by mouth every other day.    Yes [provider]  atorvastatin (LIPITOR) 40 MG tablet TAKE 1 TABLET EVERY DAY 03/18/21  Yes FBirdie Sons MD  Biotin 10 MG TABS Take 10 mg by mouth daily.    Yes [provider]  Cholecalciferol 125 MCG (5000 UT) TABS Take 1 tablet by mouth daily.   Yes [provider]  metoprolol succinate (TOPROL-XL) 25 MG 24 hr tablet TAKE 1 TABLET EVERY DAY 03/14/21  Yes Fisher, DKirstie Peri MD  MULTIPLE VITAMINS-MINERALS ER PO Take by mouth daily.  12/16/10  Yes [provider]    Allergies: No Known Allergies  Social History:  reports that she has never smoked. She has never used smokeless tobacco. She reports that she does not drink alcohol and does not use drugs.   Family History: Family History  Problem Relation Age of Onset   Cancer Other        ovarian, breast, colon cancers   Hypertension Mother    Hyperlipidemia Mother    Other Mother        enlarged heart   Heart disease Father 651  Diabetes Father    Diabetes Sister    Hypertension Sister    Kidney disease Sister    Stroke Sister    Heart disease Brother    Hypertension Brother  Diabetes Brother    Healthy Brother    Cancer Other        family hx   Coronary artery disease Other        family hx   Breast cancer Neg Hx     Review of Systems: Review of Systems  Constitutional:  Negative for chills and fever.  HENT:  Negative for hearing loss.   Respiratory:  Negative for shortness of breath.   Cardiovascular:  Negative for chest pain.  Gastrointestinal:  Negative for abdominal pain, nausea and vomiting.  Genitourinary:  Negative for dysuria.  Musculoskeletal:  Negative for myalgias.  Skin:  Negative for rash.  Neurological:  Negative for dizziness.  Psychiatric/Behavioral:  Negative for depression.    Physical  Exam BP (!) 152/91   Pulse 81   Temp 98.8 F (37.1 C)   Ht '5\' 7"'$  (1.702 m)   Wt 176 lb 9.6 oz (80.1 kg)   SpO2 97%   BMI 27.66 kg/m  CONSTITUTIONAL: No acute distress, well nourished. HEENT:  Normocephalic, atraumatic, extraocular motion intact. NECK: Trachea is midline, and there is no jugular venous distension.  RESPIRATORY:  Lungs are clear, and breath sounds are equal bilaterally. Normal respiratory effort without pathologic use of accessory muscles. CARDIOVASCULAR: Heart is regular without murmurs, gallops, or rubs. GI: The abdomen is soft, non-distended, non-tender.  The patient has what appears to be a reducible right inguinal hernia, likely a direct hernia vs a suprapubic hernia.  This is reducible, without any discomfort or pain.  No hernia palpable on the left groin, and no hernia at the umbilicus.  MUSCULOSKELETAL:  Normal muscle strength and tone in all four extremities.  No peripheral edema or cyanosis. SKIN: Skin turgor is normal. There are no pathologic skin lesions.  NEUROLOGIC:  Motor and sensation is grossly normal.  Cranial nerves are grossly intact. PSYCH:  Alert and oriented to person, place and time. Affect is normal.  Laboratory Analysis: No results found for this or any previous visit (from the past 24 hour(s)).  Imaging: U/S Pelvis 04/11/21: FINDINGS: Targeted sonographic images of the soft tissues of the anterior pelvic wall in the region of the clinical concern was performed.   There is a 4.5 x 2.1 x 5.1 cm fat containing hernia in the right suprapubic region corresponding to the area of clinical concern. The neck of the hernia defect measures approximately 2 cm in transverse diameter. There may be herniation of a short segment of bowel. No fluid collection.   IMPRESSION: Predominantly fat containing right suprapubic hernia. CT may provide better evaluation.  Assessment and Plan: This is a 75 y.o. female with likely a right inguinal  hernia.  --Discussed with the patient the findings on her ultrasound last month.  I think based on location, this is likely a right inguinal hernia, possibly a direct hernia.  Discussed with her that the natural course of hernias is to get larger with time, and some patients do develop symptoms.  Prior to our appointment, she had thought about surgery and is interested in proceeding with surgery rather than watchful waiting.  Discussed with her that is a reasonable option and that given her overall asymptomatic hernia, we also have time flexibility for her surgery.  She will discuss with her family about timing and call us back, and was thinking later in the year.  She is aware that we may need a follow up appointment for H&P update if more than 30 days from now.  Reviewed with  her the role of a robotic assisted right inguinal hernia repair, and discussed the surgery in length with her, including risks of bleeding, infection, and injury to surrounding structures.  She's willing to proceed.  Will await for her callback to schedule surgery and follow up.  In the meantime, will also send medical clearance form to Dr. Caryn Section.  She would need to stop her Aspirin 5 days prior to surgery.  Face-to-face time spent with the patient and care providers was 80 minutes, with more than 50% of the time spent counseling, educating, and coordinating care of the patient.     Melvyn Neth, [facility]

## 2021-05-08 NOTE — Progress Notes (Signed)
Medical Clearance request has been faxed to Dr Miguel Dibble office.

## 2021-05-10 NOTE — Telephone Encounter (Signed)
Tried calling patient. Left detailed message on voice message system (ok per DPR).

## 2021-05-13 ENCOUNTER — Telehealth: Payer: Self-pay | Admitting: Surgery

## 2021-05-13 NOTE — Telephone Encounter (Signed)
Outgoing call is made, left message for patient to call to see if she is ready to schedule surgery.  Wants to wait until the end of the year patient will need follow up with Dr Hampton Abbot prior to scheduling.

## 2021-05-14 ENCOUNTER — Telehealth: Payer: Self-pay | Admitting: *Deleted

## 2021-05-14 ENCOUNTER — Other Ambulatory Visit: Payer: Self-pay | Admitting: Family Medicine

## 2021-05-14 DIAGNOSIS — Z1231 Encounter for screening mammogram for malignant neoplasm of breast: Secondary | ICD-10-CM

## 2021-05-14 NOTE — Telephone Encounter (Signed)
Patient will call to schedule an appointment sometime in late November to see Dr Hampton Abbot again and to schedule surgery

## 2021-05-29 ENCOUNTER — Ambulatory Visit
Admission: RE | Admit: 2021-05-29 | Discharge: 2021-05-29 | Disposition: A | Discharge status: Home / Self Care | Payer: Medicare HMO | Source: Ambulatory Visit | Attending: Family Medicine | Admitting: Family Medicine

## 2021-05-29 ENCOUNTER — Other Ambulatory Visit: Payer: Self-pay

## 2021-05-29 DIAGNOSIS — Z1231 Encounter for screening mammogram for malignant neoplasm of breast: Secondary | ICD-10-CM | POA: Diagnosis not present

## 2021-06-27 ENCOUNTER — Telehealth: Payer: Self-pay | Admitting: Surgery

## 2021-06-27 NOTE — Telephone Encounter (Signed)
Outgoing call is made again to the patient, left message for her to call.  She last saw Dr. Hampton Abbot in office on 05/08/21 for inguinal hernia.  Patient wanted to perhaps wait until late fall.  Left message for her to call the office so that we can arrange for another follow up in the office with Dr. Hampton Abbot for possibly scheduling of her surgery.

## 2021-06-28 ENCOUNTER — Other Ambulatory Visit: Payer: Self-pay

## 2021-06-28 ENCOUNTER — Encounter: Payer: Self-pay | Admitting: Family Medicine

## 2021-06-28 ENCOUNTER — Ambulatory Visit (INDEPENDENT_AMBULATORY_CARE_PROVIDER_SITE_OTHER): Payer: Medicare HMO | Admitting: Family Medicine

## 2021-06-28 VITALS — BP 125/85 | HR 79 | Temp 98.5°F | Ht 66.5 in | Wt 179.0 lb

## 2021-06-28 DIAGNOSIS — Z0000 Encounter for general adult medical examination without abnormal findings: Secondary | ICD-10-CM

## 2021-06-28 DIAGNOSIS — M8580 Other specified disorders of bone density and structure, unspecified site: Secondary | ICD-10-CM

## 2021-06-28 DIAGNOSIS — E7800 Pure hypercholesterolemia, unspecified: Secondary | ICD-10-CM

## 2021-06-28 DIAGNOSIS — I10 Essential (primary) hypertension: Secondary | ICD-10-CM

## 2021-06-28 DIAGNOSIS — Z23 Encounter for immunization: Secondary | ICD-10-CM

## 2021-06-28 MED ORDER — TETANUS-DIPHTH-ACELL PERTUSSIS 5-2.5-18.5 LF-MCG/0.5 IM SUSY: 0.5000 mL | PREFILLED_SYRINGE | Freq: Once | INTRAMUSCULAR | 0 refills | Status: AC

## 2021-06-28 MED ORDER — AMLODIPINE BESYLATE 2.5 MG PO TABS: 2.5000 mg | ORAL_TABLET | Freq: Every day | ORAL | 3 refills | Status: DC

## 2021-06-28 NOTE — Progress Notes (Signed)
Complete Physical Exam      Patient: [name] [name], Female    DOB: Nov 04, 1945, 75 y.o.   MRN: [id number] Visit Date: 06/28/2021  Today's Provider: Lelon Huh, MD   Chief Complaint  Patient presents with   Annual Exam   Subjective    [name] [name] is a 75 y.o. female who presents today for her complete physical examination  She reports consuming a general diet.  Exercises regularly.  She generally feels well. She reports sleeping poorly. She does have additional problems to discuss today.   HPI She is also due for follow up hypertension and lipids. She feels well and tolerating current medications with no adverse effects. Home blood pressures are not being checked regular.y.   Lab Results  Component Value Date   CHOL 186 06/26/2020   HDL 66 06/26/2020   LDLCALC 104 (H) 06/26/2020   TRIG 88 06/26/2020   CHOLHDL 2.8 06/26/2020   BMET    Component Value Date/Time   NA 141 06/26/2020 0948   K 4.0 06/26/2020 0948   CL 105 06/26/2020 0948   CO2 22 06/26/2020 0948   GLUCOSE 89 06/26/2020 0948   BUN 13 06/26/2020 0948   CREATININE 0.76 06/26/2020 0948   CALCIUM 9.5 06/26/2020 0948   GFRNONAA 77 06/26/2020 0948   Last CBC Lab Results  Component Value Date   WBC 3.3 (L) 06/26/2020   HGB 12.8 06/26/2020   HCT 37.1 06/26/2020   MCV 92 06/26/2020   MCH 31.6 06/26/2020   RDW 12.7 06/26/2020   PLT 217 06/26/2020    Medications: Outpatient Medications Prior to Visit  Medication Sig   amLODipine (NORVASC) 2.5 MG tablet Take 1 tablet (2.5 mg total) by mouth daily.   aspirin 81 MG tablet Take 81 mg by mouth every other day.    atorvastatin (LIPITOR) 40 MG tablet TAKE 1 TABLET EVERY DAY   metoprolol succinate (TOPROL-XL) 25 MG 24 hr tablet TAKE 1 TABLET EVERY DAY   MULTIPLE VITAMINS-MINERALS ER PO Take by mouth daily.    Biotin 10 MG TABS Take 10 mg by mouth daily.    Cholecalciferol 125 MCG (5000 UT) TABS Take 1 tablet by mouth daily.   No facility-administered  medications prior to visit.    No Known Allergies  Patient Care Team: Birdie Sons, MD as PCP - General ([facility]) [place], [place] as Consulting Physician  Review of Systems  Constitutional: Negative.   HENT:  Positive for dental problem, hearing loss and tinnitus. Negative for congestion, drooling, ear discharge, ear pain, facial swelling, mouth sores, nosebleeds, postnasal drip, rhinorrhea, sinus pressure, sinus pain, sneezing, sore throat, trouble swallowing and voice change.   Eyes: Negative.   Respiratory: Negative.    Cardiovascular: Negative.   Gastrointestinal:  Positive for abdominal distention. Negative for abdominal pain, anal bleeding, blood in stool, constipation, diarrhea, nausea, rectal pain and vomiting.  Endocrine: Positive for polydipsia and polyuria. Negative for cold intolerance, heat intolerance and polyphagia.  Genitourinary: Negative.   Musculoskeletal: Negative.   Skin: Negative.   Allergic/Immunologic: Negative.   Neurological: Negative.   Hematological:  Positive for adenopathy. Bruises/bleeds easily.  Psychiatric/Behavioral: Negative.         Objective    Vitals: BP 125/85 (BP Location: Right Arm, Patient Position: Sitting, Cuff Size: Large)   Pulse 79   Temp 98.5 F (36.9 C) (Oral)   Ht 5' 6.5" (1.689 m)   Wt 179 lb (81.2 kg)  SpO2 98%   BMI 28.46 kg/m    Physical Exam  General Appearance:     Well developed, well nourished female. Alert, cooperative, in no acute distress, appears stated age   Head:    Normocephalic, without obvious abnormality, atraumatic  Eyes:    PERRL, conjunctiva/corneas clear, EOM's intact, fundi    benign, both eyes  Ears:    Normal TM's and external ear canals, both ears  Neck:   Supple, symmetrical, trachea midline, no adenopathy;    thyroid:  no enlargement/tenderness/nodules; no carotid   bruit or JVD  Back:     Symmetric, no curvature, ROM normal, no CVA tenderness  Lungs:     Clear to  auscultation bilaterally, respirations unlabored  Chest Wall:    No tenderness or deformity   Heart:    Normal heart rate. Normal rhythm. No murmurs, rubs, or gallops.    Breast Exam:    deferred  Abdomen:     Soft, non-tender, bowel sounds active all four quadrants,    no masses, no organomegaly  Pelvic:    deferred  Extremities:   All extremities are intact. No cyanosis or edema  Pulses:   2+ and symmetric all extremities  Skin:   Skin color, texture, turgor normal, no rashes or lesions  Lymph nodes:   Cervical, supraclavicular, and axillary nodes normal  Neurologic:   CNII-XII intact, normal strength, sensation and reflexes    throughout     Assessment & Plan      1. Annual physical exam   2. Essential hypertension Well controlled.  Continue current medications.   - amLODipine (NORVASC) 2.5 MG tablet; Take 1 tablet (2.5 mg total) by mouth daily.  Dispense: 90 tablet; Refill: 3 - CBC  3. Hypercholesteremia She is tolerating atorvastatin well with no adverse effects.   - Comprehensive metabolic panel - Lipid panel  4. Osteopenia, unspecified location BMD in 2024   5. Need for immunization against influenza  - Flu Vaccine QUAD High Dose(Fluad)  6. Prescription for Tdap. Vaccine not administered in office.   - Tdap ([place]) 5-2.5-18.5 LF-MCG/0.5 injection; Inject 0.5 mLs into the muscle once for 1 dose.  Dispense: 0.5 mL; Refill: 0      The entirety of the information documented in the History of Present Illness, Review of Systems and Physical Exam were personally obtained by me. Portions of this information were initially documented by the CMA and reviewed by me for thoroughness and accuracy.     Lelon Huh, MD  [facility] [phone] (phone) [phone] (fax)  [place]

## 2021-06-28 NOTE — Patient Instructions (Addendum)
Please review the attached list of medications and notify my office if there are any errors.   The CDC recommends two doses of Shingrix (the shingles vaccine) separated by 2 to 6 months for adults age 76 years and older. I recommend checking with your pharmacy plan regarding coverage for this vaccine.   I strongly recommend a Covid Omicron booster if it's been more than 2 months since your last covid booster or infection.

## 2021-07-03 LAB — COMPREHENSIVE METABOLIC PANEL
ALT: 21 IU/L (ref 0–32)
AST: 20 IU/L (ref 0–40)
Albumin/Globulin Ratio: 1.6 (ref 1.2–2.2)
Albumin: 4.2 g/dL (ref 3.7–4.7)
Alkaline Phosphatase: 94 IU/L (ref 44–121)
BUN/Creatinine Ratio: 17 (ref 12–28)
BUN: 13 mg/dL (ref 8–27)
Bilirubin Total: 0.5 mg/dL (ref 0.0–1.2)
CO2: 24 mmol/L (ref 20–29)
Calcium: 9.3 mg/dL (ref 8.7–10.3)
Chloride: 114 mmol/L — ABNORMAL HIGH (ref 96–106)
Creatinine, Ser: 0.78 mg/dL (ref 0.57–1.00)
Globulin, Total: 2.7 g/dL (ref 1.5–4.5)
Glucose: 84 mg/dL (ref 70–99)
Potassium: 4.6 mmol/L (ref 3.5–5.2)
Sodium: 145 mmol/L — ABNORMAL HIGH (ref 134–144)
Total Protein: 6.9 g/dL (ref 6.0–8.5)
eGFR: 79 mL/min/{1.73_m2} (ref >59)

## 2021-07-03 LAB — LIPID PANEL
Chol/HDL Ratio: 2.7 ratio (ref 0.0–4.4)
Cholesterol, Total: 191 mg/dL (ref 100–199)
HDL: 72 mg/dL (ref >39)
LDL Chol Calc (NIH): 107 mg/dL — ABNORMAL HIGH (ref 0–99)
Triglycerides: 65 mg/dL (ref 0–149)
VLDL Cholesterol Cal: 12 mg/dL (ref 5–40)

## 2021-07-03 LAB — CBC
Hematocrit: 40.2 % (ref 34.0–46.6)
Hemoglobin: 13 g/dL (ref 11.1–15.9)
MCH: 31.6 pg (ref 26.6–33.0)
MCHC: 32.3 g/dL (ref 31.5–35.7)
MCV: 98 fL — ABNORMAL HIGH (ref 79–97)
Platelets: 211 10*3/uL (ref 150–450)
RBC: 4.12 x10E6/uL (ref 3.77–5.28)
RDW: 12 % (ref 11.7–15.4)
WBC: 3.6 10*3/uL (ref 3.4–10.8)

## 2021-07-04 ENCOUNTER — Telehealth: Payer: Self-pay | Admitting: Family Medicine

## 2021-07-04 NOTE — Telephone Encounter (Signed)
Patient called in about lab results,not showing released. Please call back

## 2021-07-05 NOTE — Telephone Encounter (Signed)
Patient advised of results.

## 2021-07-24 DIAGNOSIS — H524 Presbyopia: Secondary | ICD-10-CM | POA: Diagnosis not present

## 2021-08-14 ENCOUNTER — Other Ambulatory Visit: Payer: Self-pay

## 2021-08-14 ENCOUNTER — Telehealth: Payer: Self-pay

## 2021-08-14 ENCOUNTER — Telehealth: Payer: Self-pay | Admitting: Surgery

## 2021-08-14 ENCOUNTER — Encounter: Payer: Self-pay | Admitting: Surgery

## 2021-08-14 ENCOUNTER — Ambulatory Visit: Payer: Medicare HMO | Admitting: Surgery

## 2021-08-14 VITALS — BP 149/80 | HR 79 | Temp 98.5°F | Ht 66.5 in | Wt 181.2 lb

## 2021-08-14 DIAGNOSIS — K4090 Unilateral inguinal hernia, without obstruction or gangrene, not specified as recurrent: Secondary | ICD-10-CM | POA: Diagnosis not present

## 2021-08-14 NOTE — Telephone Encounter (Signed)
Faxed medical clearance to Dr. Lelon Huh at [phone].

## 2021-08-14 NOTE — Telephone Encounter (Signed)
Outgoing call is made, left message for patient to call.  Please inform patient of the following:    Patient has been advised of Pre-Admission date/time, COVID Testing date and Surgery date.  Surgery Date: 10/01/21 Preadmission Testing Date: 09/17/21 (phone 8a-1p) Covid Testing Date: Not needed.     Also patient will need to call at [phone], between 1-3:00pm the day before surgery, to find out what time to arrive for surgery.

## 2021-08-14 NOTE — Patient Instructions (Addendum)
If you have any concerns or questions, please feel free to call our office. See follow up appointment below.   Inguinal Hernia, Adult An inguinal hernia is when fat or your intestines push through a weak spot in a muscle where your leg meets your lower belly (groin). This causes a bulge. This kind of hernia could also be: In your scrotum, if you are female. In folds of skin around your vagina, if you are female. There are three types of inguinal hernias: Hernias that can be pushed back into the belly (are reducible). This type rarely causes pain. Hernias that cannot be pushed back into the belly (are incarcerated). Hernias that cannot be pushed back into the belly and lose their blood supply (are strangulated). This type needs emergency surgery. What are the causes? This condition is caused by having a weak spot in the muscles or tissues in your groin. This develops over time. The hernia may poke through the weak spot when you strain your lower belly muscles all of a sudden, such as when you: Lift a heavy object. Strain to poop (have a bowel movement). Trouble pooping (constipation) can lead to straining. Cough. What increases the risk? This condition is more likely to develop in: Males. Pregnant females. People who: Are overweight. Work in jobs that require long periods of standing or heavy lifting. Have had an inguinal hernia before. Smoke or have lung disease. These factors can lead to long-term (chronic) coughing. What are the signs or symptoms? Symptoms may depend on the size of the hernia. Often, a small hernia has no symptoms. Symptoms of a larger hernia may include: A bulge in the groin area. This is easier to see when standing. You might not be able to see it when you are lying down. Pain or burning in the groin. This may get worse when you lift, strain, or cough. A dull ache or a feeling of pressure in the groin. An abnormal bulge in the scrotum, in males. Symptoms of a  strangulated inguinal hernia may include: A bulge in your groin that is very painful and tender to the touch. A bulge that turns red or purple. Fever, feeling like you may vomit (nausea), and vomiting. Not being able to poop or to pass gas. How is this treated? Treatment depends on the size of your hernia and whether you have symptoms. If you do not have symptoms, your doctor may have you watch your hernia carefully and have you come in for follow-up visits. If your hernia is large or if you have symptoms, you may need surgery to repair the hernia. Follow these instructions at home: Lifestyle Avoid lifting heavy objects. Avoid standing for long amounts of time. Do not smoke or use any products that contain nicotine or tobacco. If you need help quitting, ask your doctor. Stay at a healthy weight. Prevent trouble pooping You may need to take these actions to prevent or treat trouble pooping: Drink enough fluid to keep your pee (urine) pale yellow. Take over-the-counter or prescription medicines. Eat foods that are high in fiber. These include beans, whole grains, and fresh fruits and vegetables. Limit foods that are high in fat and sugar. These include fried or sweet foods. General instructions You may try to push your hernia back in place by very gently pressing on it when you are lying down. Do not try to push the bulge back in if it will not go in easily. Watch your hernia for any changes in shape, size, or  color. Tell your doctor if you see any changes. Take over-the-counter and prescription medicines only as told by your doctor. Keep all follow-up visits. Contact a doctor if: You have a fever or chills. You have new symptoms. Your symptoms get worse. Get help right away if: You have pain in your groin that gets worse all of a sudden. You have a bulge in your groin that: Gets bigger all of a sudden, and it does not get smaller after that. Turns red or purple. Is painful when you  touch it. You are a female, and you have: Sudden pain in your scrotum. A sudden change in the size of your scrotum. You cannot push the hernia back in place by very gently pressing on it when you are lying down. You feel like you may vomit, and that feeling does not go away. You keep vomiting. You have a fast heartbeat. You cannot poop or pass gas. These symptoms may be an emergency. Get help right away. Call your local emergency services (911 in the [place] Do not wait to see if the symptoms will go away. Do not drive yourself to the hospital. Summary An inguinal hernia is when fat or your intestines push through a weak spot in a muscle where your leg meets your lower belly (groin). This causes a bulge. If you do not have symptoms, you may not need treatment. If you have symptoms or a large hernia, you may need surgery. Avoid lifting heavy objects. Also, avoid standing for long amounts of time. Do not try to push the bulge back in if it will not go in easily. This information is not intended to replace advice given to you by your health care provider. Make sure you discuss any questions you have with your health care provider. Document Revised: 05/15/2020 Document Reviewed: 05/15/2020 Elsevier Patient Education  2022 [employer].

## 2021-08-14 NOTE — Telephone Encounter (Signed)
Received call back from patient, she is now aware of all dates regarding her surgery and verbalized understanding.

## 2021-08-14 NOTE — Progress Notes (Signed)
08/14/2021  History of Present Illness: [name] [name] is a 75 y.o. female presenting for follow up of a right inguinal hernia.  She was last seen on 05/08/21, and she wanted to wait until December for surgery.  She presents today to schedule surgery and discuss the surgery further.  Patient reports that the right groin has been stable, without new or worsening symptoms.  The bulging has remained the same size and denies any significant pain.  Denies any issues on the left side.  She is interested in having surgery on the last week of December.  Past Medical History: Past Medical History:  Diagnosis Date   Chest pain, unspecified    Colitis 02/20/2016   H/O left breast biopsy 12/11/2005   [facility], path report matched the clinical impression of left breast lipoma   HLD (hyperlipidemia)    mixed   HTN (hypertension)      Past Surgical History: Past Surgical History:  Procedure Laterality Date   ABDOMINAL HYSTERECTOMY  2007   total   BREAST BIOPSY Left 2007   Dr. Dwyane Luo office-benign   COLONOSCOPY  11/12/2001   multiple sessile polyps found in rectum, 54mm in size, distributed diffusely, biopsies were taken. The polyps extended from lower rectum to approximately 20 cm. They were confluent in the area between 10-15cm. Small polyps with central ulceration were noted.   COLONOSCOPY WITH PROPOFOL N/A 08/04/2018   Procedure: COLONOSCOPY WITH PROPOFOL;  Surgeon: Robert Bellow, MD;  Location: [facility] ENDOSCOPY;  Service: Endoscopy;  Laterality: N/A;   TUBAL LIGATION      Home Medications: Prior to Admission medications   Medication Sig Start Date End Date Taking? Authorizing Provider  amLODipine (NORVASC) 2.5 MG tablet Take 1 tablet (2.5 mg total) by mouth daily. 06/28/21  Yes Birdie Sons, MD  aspirin 81 MG tablet Take 81 mg by mouth every other day.    Yes [provider]  atorvastatin (LIPITOR) 40 MG tablet TAKE 1 TABLET EVERY DAY 03/18/21  Yes Birdie Sons, MD  metoprolol  succinate (TOPROL-XL) 25 MG 24 hr tablet TAKE 1 TABLET EVERY DAY 03/14/21  Yes Birdie Sons, MD  MULTIPLE VITAMINS-MINERALS ER PO Take by mouth daily.  12/16/10  Yes [provider]    Allergies: No Known Allergies  Review of Systems: Review of Systems  Constitutional:  Negative for chills and fever.  HENT:  Negative for hearing loss.   Respiratory:  Negative for shortness of breath.   Cardiovascular:  Negative for chest pain.  Gastrointestinal:  Negative for abdominal pain, nausea and vomiting.       Right groin bulging  Genitourinary:  Negative for dysuria.  Musculoskeletal:  Negative for myalgias.  Skin:  Negative for rash.  Neurological:  Negative for dizziness.  Psychiatric/Behavioral:  Negative for depression.    Physical Exam BP (!) 149/80   Pulse 79   Temp 98.5 F (36.9 C) (Oral)   Ht 5' 6.5" (1.689 m)   Wt 181 lb 3.2 oz (82.2 kg)   SpO2 98%   BMI 28.81 kg/m  CONSTITUTIONAL: No acute distress, well nourished. HEENT:  Normocephalic, atraumatic, extraocular motion intact. NECK:  Trachea is midline, no jugular venous distention. RESPIRATORY:  Lungs are clear, and breath sounds are equal bilaterally. Normal respiratory effort without pathologic use of accessory muscles. CARDIOVASCULAR: Heart is regular without murmurs, gallops, or rubs. GI: The abdomen is soft, non-distended, non-tender.  The patient has a reducible right inguinal hernia.  No palpable hernia on the  left groin.  She may have an umbilical hernia at prior tubal ligation port site, but cannot feel a true defect.  MUSCULOSKELETAL:  Normal gait, no peripheral edema NEUROLOGIC:  Motor and sensation is grossly normal.  Cranial nerves are grossly intact. PSYCH:  Alert and oriented to person, place and time. Affect is normal.  Labs/Imaging: Labs from 06/28/21: Na 145, K 4.6, Cl 114, CO2 24, BUN 13, Cr 0.78.  LFTs within normal.  WBC 3.6, Hgb 13, Hct 40.2, Plt 211.  U/S Pelvis  04/11/21: IMPRESSION: Predominantly fat containing right suprapubic hernia. CT may provide better evaluation.  Assessment and Plan: This is a 75 y.o. female with a right inguinal hernia.  --The patient would like to schedule her surgery for the last week of December.  I discussed with her that I would not be available that week but at her request, we will schedule her for the first week of January 2023, on 10/01/21.  Discussed the plan for a robotic assisted right inguinal hernia repair with mesh.  We would also look at the left groin and if there is a hernia present, would repair it at the same time.  She may have an umbilical hernia as well, and we'll evaluate this during surgery too.  Reviewed with her the surgery at length including risks of bleeding, infection, injury to surrounding structures, the use of mesh, that this is an outpatient surgery, activity restrictions after surgery, and she's willing to proceed. --Will have her follow up with me next month for H&P update.  Will also send medical clearance to her PCP.  She's currently on Aspirin and have instructed her to stop prior to surgery, with last dose on 09/25/21.  I spent 40 minutes dedicated to the care of this patient on the date of this encounter to include pre-visit review of records, face-to-face time with the patient discussing diagnosis and management, and any post-visit coordination of care.   Melvyn Neth, [facility]

## 2021-08-19 NOTE — Progress Notes (Unsigned)
Medical Clearance has been received from Dr Maralyn Sago office. The patient is cleared at Low risk for surgery.

## 2021-09-17 ENCOUNTER — Other Ambulatory Visit: Payer: Self-pay

## 2021-09-17 ENCOUNTER — Encounter
Admission: RE | Admit: 2021-09-17 | Discharge: 2021-09-17 | Disposition: A | Discharge status: Home / Self Care | Payer: Medicare HMO | Source: Ambulatory Visit | Attending: Surgery | Admitting: Surgery

## 2021-09-17 NOTE — Patient Instructions (Addendum)
Your procedure is scheduled on: Tuesday, January 3 Report to the Registration Desk on the 1st floor of the [facility]. To find out your arrival time, please call [phone] between 1PM - 3PM on: Monday, January 2  REMEMBER: Instructions that are not followed completely may result in serious medical risk, up to and including death; or upon the discretion of your surgeon and anesthesiologist your surgery may need to be rescheduled.  Do not eat food after midnight the night before surgery.  No gum chewing, lozengers or hard candies.  You may however, drink CLEAR liquids up to 2 hours before you are scheduled to arrive for your surgery. Do not drink anything within 2 hours of your scheduled arrival time.  Clear liquids include: - water  - apple juice without pulp - gatorade (not RED, PURPLE, OR BLUE) - black coffee or tea (Do NOT add milk or creamers to the coffee or tea) Do NOT drink anything that is not on this list.  TAKE THESE MEDICATIONS THE MORNING OF SURGERY WITH A SIP OF WATER:  Amlodipine  One week prior to surgery: starting December 27 Stop aspirin and Anti-inflammatories (NSAIDS) such as Advil, Aleve, Ibuprofen, Motrin, Naproxen, Naprosyn and Aspirin based products such as Excedrin, Goodys Powder, BC Powder. Stop ANY OVER THE COUNTER supplements until after surgery. Stop multiple vitamin and vitamin D. You may however, continue to take Tylenol if needed for pain up until the day of surgery.  No Alcohol for 24 hours before or after surgery.  No Smoking including e-cigarettes for 24 hours prior to surgery.  No chewable tobacco products for at least 6 hours prior to surgery.  No nicotine patches on the day of surgery.  Do not use any "recreational" drugs for at least a week prior to your surgery.  Please be advised that the combination of cocaine and anesthesia may have negative outcomes, up to and including death. If you test positive for cocaine, your surgery will be  cancelled.  On the morning of surgery brush your teeth with toothpaste and water, you may rinse your mouth with mouthwash if you wish. Do not swallow any toothpaste or mouthwash.  Use CHG Soap as directed on instruction sheet.  Do not wear jewelry, make-up, hairpins, clips or nail polish.  Do not wear lotions, powders, or perfumes.   Do not shave body from the neck down 48 hours prior to surgery just in case you cut yourself which could leave a site for infection.  Also, freshly shaved skin may become irritated if using the CHG soap.  Contact lenses, hearing aids and dentures may not be worn into surgery.  Do not bring valuables to the hospital. [facility] is not responsible for any missing/lost belongings or valuables.   Notify your doctor if there is any change in your medical condition (cold, fever, infection).  Wear comfortable clothing (specific to your surgery type) to the hospital.  After surgery, you can help prevent lung complications by doing breathing exercises.  Take deep breaths and cough every 1-2 hours. Your doctor may order a device called an Incentive Spirometer to help you take deep breaths. When coughing or sneezing, hold a pillow firmly against your incision with both hands. This is called splinting. Doing this helps protect your incision. It also decreases belly discomfort.  If you are being discharged the day of surgery, you will not be allowed to drive home. You will need a responsible adult (18 years or older) to drive you home  and stay with you that night.   If you are taking public transportation, you will need to have a responsible adult (18 years or older) with you. Please confirm with your physician that it is acceptable to use public transportation.   Please call the [facility] at [phone] if you have any questions about these instructions.  Surgery Visitation Policy:  Patients undergoing a surgery or procedure may have one  family member or support person with them as long as that person is not COVID-19 positive or experiencing its symptoms.  That person may remain in the waiting area during the procedure and may rotate out with other people.

## 2021-09-18 ENCOUNTER — Encounter
Admission: RE | Admit: 2021-09-18 | Discharge: 2021-09-18 | Disposition: A | Discharge status: Home / Self Care | Payer: Medicare HMO | Source: Ambulatory Visit | Attending: Surgery | Admitting: Surgery

## 2021-09-18 DIAGNOSIS — R54 Age-related physical debility: Secondary | ICD-10-CM | POA: Diagnosis not present

## 2021-09-18 DIAGNOSIS — Z01810 Encounter for preprocedural cardiovascular examination: Secondary | ICD-10-CM | POA: Insufficient documentation

## 2021-09-18 DIAGNOSIS — I10 Essential (primary) hypertension: Secondary | ICD-10-CM | POA: Diagnosis not present

## 2021-09-20 ENCOUNTER — Other Ambulatory Visit: Payer: Self-pay

## 2021-09-20 ENCOUNTER — Ambulatory Visit (INDEPENDENT_AMBULATORY_CARE_PROVIDER_SITE_OTHER): Payer: Medicare HMO | Admitting: Surgery

## 2021-09-20 ENCOUNTER — Encounter: Payer: Self-pay | Admitting: Surgery

## 2021-09-20 VITALS — BP 153/90 | HR 84 | Temp 98.5°F | Ht 65.5 in | Wt 182.2 lb

## 2021-09-20 DIAGNOSIS — K429 Umbilical hernia without obstruction or gangrene: Secondary | ICD-10-CM | POA: Diagnosis not present

## 2021-09-20 DIAGNOSIS — K4090 Unilateral inguinal hernia, without obstruction or gangrene, not specified as recurrent: Secondary | ICD-10-CM

## 2021-09-20 NOTE — H&P (View-Only) (Signed)
09/20/2021  History of Present Illness: [name] [name] is a 75 y.o. female presenting for follow-up of right inguinal hernia and umbilical hernia.  She is currently scheduled for surgery on 10/01/2021 and presents today for H&P update.  Patient denies any new symptoms or worsening pain.  Denies any chest pain, shortness of breath, nausea, vomiting.  She is doing well and looking forward to surgery.  Past Medical History: Past Medical History:  Diagnosis Date   Chest pain, unspecified    Colitis 02/20/2016   H/O left breast biopsy 12/11/2005   [facility], path report matched the clinical impression of left breast lipoma   HLD (hyperlipidemia)    mixed   HTN (hypertension)      Past Surgical History: Past Surgical History:  Procedure Laterality Date   ABDOMINAL HYSTERECTOMY  2007   total   BREAST BIOPSY Left 2007   Dr. Dwyane Luo office-benign   COLONOSCOPY  11/12/2001   multiple sessile polyps found in rectum, 2mm in size, distributed diffusely, biopsies were taken. The polyps extended from lower rectum to approximately 20 cm. They were confluent in the area between 10-15cm. Small polyps with central ulceration were noted.   COLONOSCOPY WITH PROPOFOL N/A 08/04/2018   Procedure: COLONOSCOPY WITH PROPOFOL;  Surgeon: Robert Bellow, MD;  Location: [facility] ENDOSCOPY;  Service: Endoscopy;  Laterality: N/A;   TUBAL LIGATION      Home Medications: Prior to Admission medications   Medication Sig Start Date End Date Taking? Authorizing Provider  acetaminophen (TYLENOL) 500 MG tablet Take 1,000 mg by mouth every 6 (six) hours as needed (for pain.).   Yes [provider]  amLODipine (NORVASC) 2.5 MG tablet Take 1 tablet (2.5 mg total) by mouth daily. 06/28/21  Yes Birdie Sons, MD  aspirin EC 81 MG tablet Take 81 mg by mouth every other day. Swallow whole.   Yes [provider]  atorvastatin (LIPITOR) 40 MG tablet TAKE 1 TABLET EVERY DAY Patient taking differently: Take 40 mg  by mouth every evening. 03/18/21  Yes Fisher, Kirstie Peri, MD  Cholecalciferol (VITAMIN D3 PO) Take 5,000 Units by mouth in the morning.   Yes [provider]  metoprolol succinate (TOPROL-XL) 25 MG 24 hr tablet TAKE 1 TABLET EVERY DAY Patient taking differently: 25 mg every evening. 03/14/21  Yes Birdie Sons, MD  Multiple Vitamin (MULTIVITAMIN WITH MINERALS) TABS tablet Take 1 tablet by mouth in the morning. Centrum [facility]   Yes [provider]    Allergies: Not on File  Review of Systems: Review of Systems  Constitutional:  Negative for chills and fever.  Respiratory:  Negative for shortness of breath.   Cardiovascular:  Negative for chest pain.  Gastrointestinal:  Negative for abdominal pain, constipation, diarrhea, nausea and vomiting.  Musculoskeletal:  Negative for myalgias.   Physical Exam BP (!) 153/90    Pulse 84    Temp 98.5 F (36.9 C) (Oral)    Ht 5' 5.5" (1.664 m)    Wt 182 lb 3.2 oz (82.6 kg)    SpO2 98%    BMI 29.86 kg/m  CONSTITUTIONAL: No acute distress, well-nourished HEENT:  Normocephalic, atraumatic, extraocular motion intact. RESPIRATORY:  Lungs are clear, and breath sounds are equal bilaterally. Normal respiratory effort without pathologic use of accessory muscles. CARDIOVASCULAR: Heart is regular without murmurs, gallops, or rubs. GI: The abdomen is soft, nondistended, nontender to palpation.  Patient has a reducible right inguinal hernia and also has a small umbilical hernia.  There  may be a hint of a left inguinal hernia but unclear at this point on exam.  NEUROLOGIC:  Motor and sensation is grossly normal.  Cranial nerves are grossly intact. PSYCH:  Alert and oriented to person, place and time. Affect is normal.  Labs/Imaging: Labs from 06/28/2021: Sodium 145, potassium 4.6, chloride 114, CO2 24, BUN 13, creatinine 0.78, LFTs within normal.  WBC 3.6, hemoglobin 13, hematocrit 40.2, platelets 211.  Ultrasound pelvis on  04/11/2021: FINDINGS: Targeted sonographic images of the soft tissues of the anterior pelvic wall in the region of the clinical concern was performed.   There is a 4.5 x 2.1 x 5.1 cm fat containing hernia in the right suprapubic region corresponding to the area of clinical concern. The neck of the hernia defect measures approximately 2 cm in transverse diameter. There may be herniation of a short segment of bowel. No fluid collection.   IMPRESSION: Predominantly fat containing right suprapubic hernia. CT may provide better evaluation.  Assessment and Plan: This is a 75 y.o. female with a right inguinal hernia and umbilical hernia.  -Patient is scheduled for 10/01/21 for robotic assisted right inguinal hernia repair and open umbilical hernia repair.  Discussed with her that during surgery we would evaluate the left groin as well and if there is evidence of hernia there, we would repair at the same time. --Reviewed with her the surgery at length again and all questions have been answered. --Reminded patient that her last dose of Aspirin would be on 09/25/21.  I spent 15 minutes dedicated to the care of this patient on the date of this encounter to include pre-visit review of records, face-to-face time with the patient discussing diagnosis and management, and any post-visit coordination of care.   Melvyn Neth, [facility]

## 2021-09-20 NOTE — Progress Notes (Signed)
09/20/2021  History of Present Illness: [name] [name] is a 75 y.o. female presenting for follow-up of right inguinal hernia and umbilical hernia.  She is currently scheduled for surgery on 10/01/2021 and presents today for H&P update.  Patient denies any new symptoms or worsening pain.  Denies any chest pain, shortness of breath, nausea, vomiting.  She is doing well and looking forward to surgery.  Past Medical History: Past Medical History:  Diagnosis Date   Chest pain, unspecified    Colitis 02/20/2016   H/O left breast biopsy 12/11/2005   [facility], path report matched the clinical impression of left breast lipoma   HLD (hyperlipidemia)    mixed   HTN (hypertension)      Past Surgical History: Past Surgical History:  Procedure Laterality Date   ABDOMINAL HYSTERECTOMY  2007   total   BREAST BIOPSY Left 2007   Dr. Dwyane Luo office-benign   COLONOSCOPY  11/12/2001   multiple sessile polyps found in rectum, 59mm in size, distributed diffusely, biopsies were taken. The polyps extended from lower rectum to approximately 20 cm. They were confluent in the area between 10-15cm. Small polyps with central ulceration were noted.   COLONOSCOPY WITH PROPOFOL N/A 08/04/2018   Procedure: COLONOSCOPY WITH PROPOFOL;  Surgeon: Robert Bellow, MD;  Location: [facility] ENDOSCOPY;  Service: Endoscopy;  Laterality: N/A;   TUBAL LIGATION      Home Medications: Prior to Admission medications   Medication Sig Start Date End Date Taking? Authorizing Provider  acetaminophen (TYLENOL) 500 MG tablet Take 1,000 mg by mouth every 6 (six) hours as needed (for pain.).   Yes [provider]  amLODipine (NORVASC) 2.5 MG tablet Take 1 tablet (2.5 mg total) by mouth daily. 06/28/21  Yes Birdie Sons, MD  aspirin EC 81 MG tablet Take 81 mg by mouth every other day. Swallow whole.   Yes [provider]  atorvastatin (LIPITOR) 40 MG tablet TAKE 1 TABLET EVERY DAY Patient taking differently: Take 40 mg  by mouth every evening. 03/18/21  Yes Fisher, Kirstie Peri, MD  Cholecalciferol (VITAMIN D3 PO) Take 5,000 Units by mouth in the morning.   Yes [provider]  metoprolol succinate (TOPROL-XL) 25 MG 24 hr tablet TAKE 1 TABLET EVERY DAY Patient taking differently: 25 mg every evening. 03/14/21  Yes Birdie Sons, MD  Multiple Vitamin (MULTIVITAMIN WITH MINERALS) TABS tablet Take 1 tablet by mouth in the morning. Centrum for Women   Yes [provider]    Allergies: Not on File  Review of Systems: Review of Systems  Constitutional:  Negative for chills and fever.  Respiratory:  Negative for shortness of breath.   Cardiovascular:  Negative for chest pain.  Gastrointestinal:  Negative for abdominal pain, constipation, diarrhea, nausea and vomiting.  Musculoskeletal:  Negative for myalgias.   Physical Exam BP (!) 153/90    Pulse 84    Temp 98.5 F (36.9 C) (Oral)    Ht 5' 5.5" (1.664 m)    Wt 182 lb 3.2 oz (82.6 kg)    SpO2 98%    BMI 29.86 kg/m  CONSTITUTIONAL: No acute distress, well-nourished HEENT:  Normocephalic, atraumatic, extraocular motion intact. RESPIRATORY:  Lungs are clear, and breath sounds are equal bilaterally. Normal respiratory effort without pathologic use of accessory muscles. CARDIOVASCULAR: Heart is regular without murmurs, gallops, or rubs. GI: The abdomen is soft, nondistended, nontender to palpation.  Patient has a reducible right inguinal hernia and also has a small umbilical hernia.  There  may be a hint of a left inguinal hernia but unclear at this point on exam.  NEUROLOGIC:  Motor and sensation is grossly normal.  Cranial nerves are grossly intact. PSYCH:  Alert and oriented to person, place and time. Affect is normal.  Labs/Imaging: Labs from 06/28/2021: Sodium 145, potassium 4.6, chloride 114, CO2 24, BUN 13, creatinine 0.78, LFTs within normal.  WBC 3.6, hemoglobin 13, hematocrit 40.2, platelets 211.  Ultrasound pelvis on  04/11/2021: FINDINGS: Targeted sonographic images of the soft tissues of the anterior pelvic wall in the region of the clinical concern was performed.   There is a 4.5 x 2.1 x 5.1 cm fat containing hernia in the right suprapubic region corresponding to the area of clinical concern. The neck of the hernia defect measures approximately 2 cm in transverse diameter. There may be herniation of a short segment of bowel. No fluid collection.   IMPRESSION: Predominantly fat containing right suprapubic hernia. CT may provide better evaluation.  Assessment and Plan: This is a 75 y.o. female with a right inguinal hernia and umbilical hernia.  -Patient is scheduled for 10/01/21 for robotic assisted right inguinal hernia repair and open umbilical hernia repair.  Discussed with her that during surgery we would evaluate the left groin as well and if there is evidence of hernia there, we would repair at the same time. --Reviewed with her the surgery at length again and all questions have been answered. --Reminded patient that her last dose of Aspirin would be on 09/25/21.  I spent 15 minutes dedicated to the care of this patient on the date of this encounter to include pre-visit review of records, face-to-face time with the patient discussing diagnosis and management, and any post-visit coordination of care.   Melvyn Neth, [facility]

## 2021-09-20 NOTE — Patient Instructions (Addendum)
Please call office with any questions or concerns.   Inguinal Hernia, Adult An inguinal hernia is when fat or your intestines push through a weak spot in a muscle where your leg meets your lower belly (groin). This causes a bulge. This kind of hernia could also be: In your scrotum, if you are female. In folds of skin around your vagina, if you are female. There are three types of inguinal hernias: Hernias that can be pushed back into the belly (are reducible). This type rarely causes pain. Hernias that cannot be pushed back into the belly (are incarcerated). Hernias that cannot be pushed back into the belly and lose their blood supply (are strangulated). This type needs emergency surgery. What are the causes? This condition is caused by having a weak spot in the muscles or tissues in your groin. This develops over time. The hernia may poke through the weak spot when you strain your lower belly muscles all of a sudden, such as when you: Lift a heavy object. Strain to poop (have a bowel movement). Trouble pooping (constipation) can lead to straining. Cough. What increases the risk? This condition is more likely to develop in: Males. Pregnant females. People who: Are overweight. Work in jobs that require long periods of standing or heavy lifting. Have had an inguinal hernia before. Smoke or have lung disease. These factors can lead to long-term (chronic) coughing. What are the signs or symptoms? Symptoms may depend on the size of the hernia. Often, a small hernia has no symptoms. Symptoms of a larger hernia may include: A bulge in the groin area. This is easier to see when standing. You might not be able to see it when you are lying down. Pain or burning in the groin. This may get worse when you lift, strain, or cough. A dull ache or a feeling of pressure in the groin. An abnormal bulge in the scrotum, in males. Symptoms of a strangulated inguinal hernia may include: A bulge in your groin  that is very painful and tender to the touch. A bulge that turns red or purple. Fever, feeling like you may vomit (nausea), and vomiting. Not being able to poop or to pass gas. How is this treated? Treatment depends on the size of your hernia and whether you have symptoms. If you do not have symptoms, your doctor may have you watch your hernia carefully and have you come in for follow-up visits. If your hernia is large or if you have symptoms, you may need surgery to repair the hernia. Follow these instructions at home: Lifestyle Avoid lifting heavy objects. Avoid standing for long amounts of time. Do not smoke or use any products that contain nicotine or tobacco. If you need help quitting, ask your doctor. Stay at a healthy weight. Prevent trouble pooping You may need to take these actions to prevent or treat trouble pooping: Drink enough fluid to keep your pee (urine) pale yellow. Take over-the-counter or prescription medicines. Eat foods that are high in fiber. These include beans, whole grains, and fresh fruits and vegetables. Limit foods that are high in fat and sugar. These include fried or sweet foods. General instructions You may try to push your hernia back in place by very gently pressing on it when you are lying down. Do not try to push the bulge back in if it will not go in easily. Watch your hernia for any changes in shape, size, or color. Tell your doctor if you see any changes. Take over-the-counter  and prescription medicines only as told by your doctor. Keep all follow-up visits. Contact a doctor if: You have a fever or chills. You have new symptoms. Your symptoms get worse. Get help right away if: You have pain in your groin that gets worse all of a sudden. You have a bulge in your groin that: Gets bigger all of a sudden, and it does not get smaller after that. Turns red or purple. Is painful when you touch it. You are a female, and you have: Sudden pain in your  scrotum. A sudden change in the size of your scrotum. You cannot push the hernia back in place by very gently pressing on it when you are lying down. You feel like you may vomit, and that feeling does not go away. You keep vomiting. You have a fast heartbeat. You cannot poop or pass gas. These symptoms may be an emergency. Get help right away. Call your local emergency services (911 in the [place] Do not wait to see if the symptoms will go away. Do not drive yourself to the hospital. Summary An inguinal hernia is when fat or your intestines push through a weak spot in a muscle where your leg meets your lower belly (groin). This causes a bulge. If you do not have symptoms, you may not need treatment. If you have symptoms or a large hernia, you may need surgery. Avoid lifting heavy objects. Also, avoid standing for long amounts of time. Do not try to push the bulge back in if it will not go in easily. This information is not intended to replace advice given to you by your health care provider. Make sure you discuss any questions you have with your health care provider. Document Revised: 05/15/2020 Document Reviewed: 05/15/2020 Elsevier Patient Education  2022 [employer].

## 2021-09-26 ENCOUNTER — Other Ambulatory Visit: Payer: Self-pay | Admitting: Family Medicine

## 2021-09-26 DIAGNOSIS — I10 Essential (primary) hypertension: Secondary | ICD-10-CM

## 2021-09-26 NOTE — Telephone Encounter (Signed)
Requested Prescriptions  Pending Prescriptions Disp Refills   atorvastatin (LIPITOR) 40 MG tablet [Pharmacy Med Name: ATORVASTATIN CALCIUM 40 MG Tablet] 90 tablet 1    Sig: TAKE 1 TABLET EVERY DAY     Cardiovascular:  Antilipid - Statins Failed - 09/26/2021  3:38 AM      Failed - LDL in normal range and within 360 days    LDL Chol Calc (NIH)  Date Value Ref Range Status  06/28/2021 107 (H) 0 - 99 mg/dL Final         Passed - Total Cholesterol in normal range and within 360 days    Cholesterol, Total  Date Value Ref Range Status  06/28/2021 191 100 - 199 mg/dL Final         Passed - HDL in normal range and within 360 days    HDL  Date Value Ref Range Status  06/28/2021 72 >39 mg/dL Final         Passed - Triglycerides in normal range and within 360 days    Triglycerides  Date Value Ref Range Status  06/28/2021 65 0 - 149 mg/dL Final         Passed - Patient is not pregnant      Passed - Valid encounter within last 12 months    Recent Outpatient Visits          3 months ago Annual physical exam   [facility] Birdie Sons, MD   7 months ago Mass of subcutaneous tissue   [facility] Birdie Sons, MD   1 year ago Annual physical exam   [facility] Birdie Sons, MD   2 years ago [employer], Donald E, MD   2 years ago Annual physical exam   [facility] Birdie Sons, MD              amLODipine (NORVASC) 2.5 MG tablet [Pharmacy Med Name: AMLODIPINE BESYLATE 2.5 MG Tablet] 90 tablet 3    Sig: TAKE 1 TABLET EVERY DAY . (DOSE CHANGED TO 2.5MG )     Cardiovascular:  Calcium Channel Blockers Failed - 09/26/2021  3:38 AM      Failed - Last BP in normal range    BP Readings from Last 1 Encounters:  09/20/21 (!) 153/90         Passed - Valid encounter within last 6 months    Recent Outpatient Visits          3 months ago Annual physical exam    [facility] Birdie Sons, MD   7 months ago Mass of subcutaneous tissue   [facility] Birdie Sons, MD   1 year ago Annual physical exam   [facility] Birdie Sons, MD   2 years ago [place], Donald E, MD   2 years ago Annual physical exam   [facility] Birdie Sons, MD             Amlodipine was filled 06/28/2021 #90 with 3 refills. Pt should have an ample supply

## 2021-10-01 ENCOUNTER — Other Ambulatory Visit: Payer: Self-pay

## 2021-10-01 ENCOUNTER — Ambulatory Visit: Payer: Medicare HMO | Admitting: Urgent Care

## 2021-10-01 ENCOUNTER — Encounter
Admission: RE | Disposition: A | Discharge status: Home / Self Care | Payer: Self-pay | Source: Home / Self Care | Attending: Surgery

## 2021-10-01 ENCOUNTER — Encounter: Payer: Self-pay | Admitting: Surgery

## 2021-10-01 ENCOUNTER — Ambulatory Visit
Admission: RE | Admit: 2021-10-01 | Discharge: 2021-10-01 | Disposition: A | Discharge status: Home / Self Care | Payer: Medicare HMO | Attending: Surgery | Admitting: Surgery

## 2021-10-01 DIAGNOSIS — I10 Essential (primary) hypertension: Secondary | ICD-10-CM | POA: Insufficient documentation

## 2021-10-01 DIAGNOSIS — K429 Umbilical hernia without obstruction or gangrene: Secondary | ICD-10-CM | POA: Diagnosis not present

## 2021-10-01 DIAGNOSIS — K4020 Bilateral inguinal hernia, without obstruction or gangrene, not specified as recurrent: Secondary | ICD-10-CM | POA: Insufficient documentation

## 2021-10-01 DIAGNOSIS — K219 Gastro-esophageal reflux disease without esophagitis: Secondary | ICD-10-CM | POA: Diagnosis not present

## 2021-10-01 DIAGNOSIS — K4090 Unilateral inguinal hernia, without obstruction or gangrene, not specified as recurrent: Secondary | ICD-10-CM

## 2021-10-01 HISTORY — PX: UMBILICAL HERNIA REPAIR: SHX196

## 2021-10-01 SURGERY — HERNIORRHAPHY, INGUINAL, ROBOT-ASSISTED, LAPAROSCOPIC
Anesthesia: General | Site: Groin

## 2021-10-01 MED ORDER — PROPOFOL 10 MG/ML IV BOLUS
INTRAVENOUS | Status: AC
  Filled 2021-10-01: qty 40

## 2021-10-01 MED ORDER — SUGAMMADEX SODIUM 200 MG/2ML IV SOLN
INTRAVENOUS | Status: DC | PRN
  Administered 2021-10-01: 100 mg via INTRAVENOUS

## 2021-10-01 MED ORDER — MIDAZOLAM HCL 2 MG/2ML IJ SOLN
INTRAMUSCULAR | Status: DC | PRN
  Administered 2021-10-01: 2 mg via INTRAVENOUS

## 2021-10-01 MED ORDER — ROCURONIUM BROMIDE 100 MG/10ML IV SOLN
INTRAVENOUS | Status: DC | PRN
  Administered 2021-10-01: 30 mg via INTRAVENOUS
  Administered 2021-10-01: 40 mg via INTRAVENOUS

## 2021-10-01 MED ORDER — CEFAZOLIN SODIUM-DEXTROSE 2-4 GM/100ML-% IV SOLN
2.0000 g | INTRAVENOUS | Status: AC
  Administered 2021-10-01: 2 g via INTRAVENOUS

## 2021-10-01 MED ORDER — FENTANYL CITRATE (PF) 100 MCG/2ML IJ SOLN
25.0000 ug | INTRAMUSCULAR | Status: DC | PRN
  Administered 2021-10-01: 50 ug via INTRAVENOUS

## 2021-10-01 MED ORDER — LACTATED RINGERS IV SOLN
INTRAVENOUS | Status: DC
Start: 2021-10-01 — End: 2021-10-01

## 2021-10-01 MED ORDER — ACETAMINOPHEN 500 MG PO TABS: 1000.0000 mg | ORAL_TABLET | Freq: Four times a day (QID) | ORAL | Status: AC | PRN

## 2021-10-01 MED ORDER — ONDANSETRON HCL 4 MG/2ML IJ SOLN
INTRAMUSCULAR | Status: AC
  Filled 2021-10-01: qty 2

## 2021-10-01 MED ORDER — FENTANYL CITRATE (PF) 100 MCG/2ML IJ SOLN
INTRAMUSCULAR | Status: AC
  Filled 2021-10-01: qty 2

## 2021-10-01 MED ORDER — DEXAMETHASONE SODIUM PHOSPHATE 10 MG/ML IJ SOLN
INTRAMUSCULAR | Status: DC | PRN
  Administered 2021-10-01: 10 mg via INTRAVENOUS

## 2021-10-01 MED ORDER — FAMOTIDINE 20 MG PO TABS
ORAL_TABLET | ORAL | Status: AC
  Administered 2021-10-01: 20 mg via ORAL
  Filled 2021-10-01: qty 1

## 2021-10-01 MED ORDER — CHLORHEXIDINE GLUCONATE 0.12 % MT SOLN
OROMUCOSAL | Status: AC
  Administered 2021-10-01: 15 mL via OROMUCOSAL
  Filled 2021-10-01: qty 15

## 2021-10-01 MED ORDER — FAMOTIDINE 20 MG PO TABS: 20.0000 mg | ORAL_TABLET | Freq: Once | ORAL | Status: AC

## 2021-10-01 MED ORDER — BUPIVACAINE-EPINEPHRINE (PF) 0.5% -1:200000 IJ SOLN
INTRAMUSCULAR | Status: DC | PRN
  Administered 2021-10-01: 30 mL

## 2021-10-01 MED ORDER — ROCURONIUM BROMIDE 10 MG/ML (PF) SYRINGE
PREFILLED_SYRINGE | INTRAVENOUS | Status: AC
  Filled 2021-10-01: qty 10

## 2021-10-01 MED ORDER — IBUPROFEN 600 MG PO TABS: 600.0000 mg | ORAL_TABLET | Freq: Three times a day (TID) | ORAL | 1 refills | Status: DC | PRN

## 2021-10-01 MED ORDER — FENTANYL CITRATE (PF) 100 MCG/2ML IJ SOLN
INTRAMUSCULAR | Status: AC
  Administered 2021-10-01: 50 ug via INTRAVENOUS
  Filled 2021-10-01: qty 2

## 2021-10-01 MED ORDER — BUPIVACAINE LIPOSOME 1.3 % IJ SUSP
INTRAMUSCULAR | Status: AC
  Filled 2021-10-01: qty 20

## 2021-10-01 MED ORDER — ACETAMINOPHEN 500 MG PO TABS: 1000.0000 mg | ORAL_TABLET | ORAL | Status: DC

## 2021-10-01 MED ORDER — ONDANSETRON HCL 4 MG/2ML IJ SOLN
INTRAMUSCULAR | Status: DC | PRN
  Administered 2021-10-01: 4 mg via INTRAVENOUS

## 2021-10-01 MED ORDER — CHLORHEXIDINE GLUCONATE CLOTH 2 % EX PADS: 6.0000 | MEDICATED_PAD | Freq: Once | CUTANEOUS | Status: DC

## 2021-10-01 MED ORDER — FENTANYL CITRATE (PF) 100 MCG/2ML IJ SOLN
INTRAMUSCULAR | Status: DC | PRN
  Administered 2021-10-01: 25 ug via INTRAVENOUS
  Administered 2021-10-01: 50 ug via INTRAVENOUS
  Administered 2021-10-01: 25 ug via INTRAVENOUS

## 2021-10-01 MED ORDER — LORAZEPAM 2 MG/ML IJ SOLN: 1.0000 mg | Freq: Once | INTRAMUSCULAR | Status: DC | PRN

## 2021-10-01 MED ORDER — EPHEDRINE SULFATE 50 MG/ML IJ SOLN
INTRAMUSCULAR | Status: DC | PRN
  Administered 2021-10-01: 5 mg via INTRAVENOUS

## 2021-10-01 MED ORDER — CEFAZOLIN SODIUM-DEXTROSE 2-4 GM/100ML-% IV SOLN
INTRAVENOUS | Status: AC
  Filled 2021-10-01: qty 100

## 2021-10-01 MED ORDER — ONDANSETRON HCL 4 MG/2ML IJ SOLN: 4.0000 mg | Freq: Once | INTRAMUSCULAR | Status: DC | PRN

## 2021-10-01 MED ORDER — GABAPENTIN 100 MG PO CAPS: 100.0000 mg | ORAL_CAPSULE | ORAL | Status: AC

## 2021-10-01 MED ORDER — CHLORHEXIDINE GLUCONATE CLOTH 2 % EX PADS
6.0000 | MEDICATED_PAD | Freq: Once | CUTANEOUS | Status: AC
  Administered 2021-10-01: 6 via TOPICAL

## 2021-10-01 MED ORDER — CELECOXIB 200 MG PO CAPS
200.0000 mg | ORAL_CAPSULE | Freq: Once | ORAL | Status: AC
  Administered 2021-10-01: 200 mg via ORAL

## 2021-10-01 MED ORDER — DEXMEDETOMIDINE (PRECEDEX) IN NS 20 MCG/5ML (4 MCG/ML) IV SYRINGE
PREFILLED_SYRINGE | INTRAVENOUS | Status: DC | PRN
  Administered 2021-10-01: 4 ug via INTRAVENOUS

## 2021-10-01 MED ORDER — MEPERIDINE HCL 25 MG/ML IJ SOLN: 6.2500 mg | INTRAMUSCULAR | Status: DC | PRN

## 2021-10-01 MED ORDER — GABAPENTIN 100 MG PO CAPS
ORAL_CAPSULE | ORAL | Status: AC
  Administered 2021-10-01: 100 mg via ORAL
  Filled 2021-10-01: qty 1

## 2021-10-01 MED ORDER — ACETAMINOPHEN 500 MG PO TABS
ORAL_TABLET | ORAL | Status: AC
  Filled 2021-10-01: qty 2

## 2021-10-01 MED ORDER — CHLORHEXIDINE GLUCONATE 0.12 % MT SOLN: 15.0000 mL | Freq: Once | OROMUCOSAL | Status: AC

## 2021-10-01 MED ORDER — DEXAMETHASONE SODIUM PHOSPHATE 10 MG/ML IJ SOLN
INTRAMUSCULAR | Status: AC
  Filled 2021-10-01: qty 1

## 2021-10-01 MED ORDER — PROPOFOL 10 MG/ML IV BOLUS
INTRAVENOUS | Status: DC | PRN
Start: 2021-10-01 — End: 2021-10-01
  Administered 2021-10-01: 150 mg via INTRAVENOUS

## 2021-10-01 MED ORDER — OXYCODONE HCL 5 MG PO TABS: 5.0000 mg | ORAL_TABLET | ORAL | 0 refills | Status: DC | PRN

## 2021-10-01 MED ORDER — 0.9 % SODIUM CHLORIDE (POUR BTL) OPTIME
TOPICAL | Status: DC | PRN
  Administered 2021-10-01: 500 mL

## 2021-10-01 MED ORDER — MIDAZOLAM HCL 2 MG/2ML IJ SOLN
INTRAMUSCULAR | Status: AC
  Filled 2021-10-01: qty 2

## 2021-10-01 MED ORDER — ORAL CARE MOUTH RINSE: 15.0000 mL | Freq: Once | OROMUCOSAL | Status: AC

## 2021-10-01 MED ORDER — BUPIVACAINE LIPOSOME 1.3 % IJ SUSP: 20.0000 mL | Freq: Once | INTRAMUSCULAR | Status: DC

## 2021-10-01 MED ORDER — LIDOCAINE HCL (CARDIAC) PF 100 MG/5ML IV SOSY
PREFILLED_SYRINGE | INTRAVENOUS | Status: AC
  Filled 2021-10-01: qty 5

## 2021-10-01 MED ORDER — BUPIVACAINE-EPINEPHRINE (PF) 0.5% -1:200000 IJ SOLN
INTRAMUSCULAR | Status: AC
  Filled 2021-10-01: qty 30

## 2021-10-01 MED ORDER — BUPIVACAINE LIPOSOME 1.3 % IJ SUSP
INTRAMUSCULAR | Status: DC | PRN
  Administered 2021-10-01: 20 mL

## 2021-10-01 MED ORDER — CELECOXIB 200 MG PO CAPS
ORAL_CAPSULE | ORAL | Status: AC
  Filled 2021-10-01: qty 1

## 2021-10-01 SURGICAL SUPPLY — 69 items
ADH SKN CLS APL DERMABOND .7 (GAUZE/BANDAGES/DRESSINGS) ×2
APL PRP STRL LF DISP 70% ISPRP (MISCELLANEOUS) ×2
BLADE SURG 15 STRL LF DISP TIS (BLADE) ×3 IMPLANT
BLADE SURG 15 STRL SS (BLADE) ×3
CANNULA REDUC XI 12-8 STAPL (CANNULA) ×3
CANNULA REDUCER 12-8 DVNC XI (CANNULA) ×3 IMPLANT
CHLORAPREP W/TINT 26 (MISCELLANEOUS) ×4 IMPLANT
COVER TIP SHEARS 8 DVNC (MISCELLANEOUS) ×3 IMPLANT
COVER TIP SHEARS 8MM DA VINCI (MISCELLANEOUS) ×3
COVER WAND RF STERILE (DRAPES) ×4 IMPLANT
DEFOGGER SCOPE WARMER CLEARIFY (MISCELLANEOUS) ×4 IMPLANT
DERMABOND ADVANCED (GAUZE/BANDAGES/DRESSINGS) ×1
DERMABOND ADVANCED .7 DNX12 (GAUZE/BANDAGES/DRESSINGS) ×3 IMPLANT
DRAPE ARM DVNC X/XI (DISPOSABLE) ×9 IMPLANT
DRAPE COLUMN DVNC XI (DISPOSABLE) ×3 IMPLANT
DRAPE DA VINCI XI ARM (DISPOSABLE) ×9
DRAPE DA VINCI XI COLUMN (DISPOSABLE) ×3
DRAPE LAPAROTOMY 77X122 PED (DRAPES) ×4 IMPLANT
ELECT CAUTERY BLADE TIP 2.5 (TIP) ×3
ELECT REM PT RETURN 9FT ADLT (ELECTROSURGICAL) ×3
ELECTRODE CAUTERY BLDE TIP 2.5 (TIP) ×3 IMPLANT
ELECTRODE REM PT RTRN 9FT ADLT (ELECTROSURGICAL) ×3 IMPLANT
GLOVE SURG SYN 7.0 (GLOVE) ×6 IMPLANT
GLOVE SURG SYN 7.0 PF PI (GLOVE) ×6 IMPLANT
GLOVE SURG SYN 7.5  E (GLOVE) ×6
GLOVE SURG SYN 7.5 E (GLOVE) ×4 IMPLANT
GLOVE SURG SYN 7.5 PF PI (GLOVE) ×6 IMPLANT
GOWN STRL REUS W/ TWL LRG LVL3 (GOWN DISPOSABLE) ×12 IMPLANT
GOWN STRL REUS W/TWL LRG LVL3 (GOWN DISPOSABLE) ×12
IRRIGATION STRYKERFLOW (MISCELLANEOUS) ×3 IMPLANT
IRRIGATOR STRYKERFLOW (MISCELLANEOUS) ×3
KIT PINK PAD W/HEAD ARE REST (MISCELLANEOUS) ×3
KIT PINK PAD W/HEAD ARM REST (MISCELLANEOUS) ×3 IMPLANT
LABEL OR SOLS (LABEL) ×4 IMPLANT
MESH 3DMAX 4X6 LT LRG (Mesh General) ×1 IMPLANT
MESH 3DMAX 4X6 RT LRG (Mesh General) ×1 IMPLANT
MESH 3DMAX MID 4X6 LT LRG (Mesh General) IMPLANT
MESH 3DMAX MID 4X6 RT LRG (Mesh General) IMPLANT
NDL INSUFFLATION 14GA 120MM (NEEDLE) ×3 IMPLANT
NEEDLE HYPO 22GX1.5 SAFETY (NEEDLE) ×4 IMPLANT
NEEDLE INSUFFLATION 14GA 120MM (NEEDLE) ×3 IMPLANT
NS IRRIG 500ML POUR BTL (IV SOLUTION) ×4 IMPLANT
OBTURATOR OPTICAL STANDARD 8MM (TROCAR) ×3
OBTURATOR OPTICAL STND 8 DVNC (TROCAR) ×2
OBTURATOR OPTICALSTD 8 DVNC (TROCAR) ×3 IMPLANT
PACK BASIN MINOR ARMC (MISCELLANEOUS) ×4 IMPLANT
PACK LAP CHOLECYSTECTOMY (MISCELLANEOUS) ×4 IMPLANT
PENCIL ELECTRO HAND CTR (MISCELLANEOUS) ×4 IMPLANT
SEAL CANN UNIV 5-8 DVNC XI (MISCELLANEOUS) ×9 IMPLANT
SEAL XI 5MM-8MM UNIVERSAL (MISCELLANEOUS) ×9
SET TUBE SMOKE EVAC HIGH FLOW (TUBING) ×4 IMPLANT
SOLUTION ELECTROLUBE (MISCELLANEOUS) ×4 IMPLANT
SPONGE T-LAP 18X18 ~~LOC~~+RFID (SPONGE) ×4 IMPLANT
STAPLER CANNULA SEAL DVNC XI (STAPLE) ×3 IMPLANT
STAPLER CANNULA SEAL XI (STAPLE) ×3
SUT ETHIBOND 0 MO6 C/R (SUTURE) ×4 IMPLANT
SUT MNCRL AB 4-0 PS2 18 (SUTURE) ×5 IMPLANT
SUT VIC AB 2-0 SH 27 (SUTURE) ×6
SUT VIC AB 2-0 SH 27XBRD (SUTURE) ×6 IMPLANT
SUT VIC AB 3-0 SH 27 (SUTURE) ×3
SUT VIC AB 3-0 SH 27X BRD (SUTURE) ×3 IMPLANT
SUT VICRYL 0 AB UR-6 (SUTURE) ×8 IMPLANT
SUT VLOC 90 S/L VL9 GS22 (SUTURE) ×5 IMPLANT
SYR 20ML LL LF (SYRINGE) ×4 IMPLANT
SYR BULB IRRIG 60ML STRL (SYRINGE) ×4 IMPLANT
TAPE TRANSPORE STRL 2 31045 (GAUZE/BANDAGES/DRESSINGS) ×4 IMPLANT
TRAY FOLEY SLVR 16FR LF STAT (SET/KITS/TRAYS/PACK) ×4 IMPLANT
TROCAR BALLN GELPORT 12X130M (ENDOMECHANICALS) ×4 IMPLANT
WATER STERILE IRR 500ML POUR (IV SOLUTION) ×4 IMPLANT

## 2021-10-01 NOTE — Discharge Instructions (Signed)

## 2021-10-01 NOTE — Transfer of Care (Signed)
Immediate Anesthesia Transfer of Care Note  Patient: [name] [name]  Procedure(s) Performed: XI ROBOTIC ASSISTED INGUINAL HERNIA (Bilateral: Groin) HERNIA REPAIR UMBILICAL ADULT, open (Abdomen)  Patient Location: PACU  Anesthesia Type:General  Level of Consciousness: drowsy  Airway & Oxygen Therapy: Patient Spontanous Breathing and Patient connected to face mask oxygen  Post-op Assessment: Report given to RN and Post -op Vital signs reviewed and stable  Post vital signs: Reviewed and stable  Last Vitals:  Vitals Value Taken Time  BP 144/84 10/01/21 1045  Temp 36.7 C 10/01/21 1045  Pulse 75 10/01/21 1048  Resp 19 10/01/21 1048  SpO2 100 % 10/01/21 1048  Vitals shown include unvalidated device data.  Last Pain:  Vitals:   10/01/21 1045  TempSrc:   PainSc: Asleep         Complications: No notable events documented.

## 2021-10-01 NOTE — Anesthesia Preprocedure Evaluation (Addendum)
Anesthesia Evaluation    Airway Mallampati: II       Dental no notable dental hx.    Pulmonary    Pulmonary exam normal        Cardiovascular hypertension, Normal cardiovascular exam  EKG  NSR  PAC   Neuro/Psych    GI/Hepatic GERD  ,R IH   Endo/Other    Renal/GU      Musculoskeletal   Abdominal   Peds  Hematology   Anesthesia Other Findings   Reproductive/Obstetrics                             Anesthesia Physical Anesthesia Plan  ASA: 2  Anesthesia Plan: General ETT and General   Post-op Pain Management:    Induction: Intravenous  PONV Risk Score and Plan:   Airway Management Planned: Oral ETT  Additional Equipment:   Intra-op Plan:   Post-operative Plan: Extubation in OR  Informed Consent: I have reviewed the patients History and Physical, chart, labs and discussed the procedure including the risks, benefits and alternatives for the proposed anesthesia with the patient or authorized representative who has indicated his/her understanding and acceptance.     Dental Advisory Given  Plan Discussed with: Anesthesiologist, CRNA and Surgeon  Anesthesia Plan Comments: (Patient consented for risks of anesthesia including but not limited to:  - adverse reactions to medications - damage to eyes, teeth, lips or other oral mucosa - nerve damage due to positioning  - sore throat or hoarseness - Damage to heart, brain, nerves, lungs, other parts of body or loss of life  Patient voiced understanding.)        Anesthesia Quick Evaluation

## 2021-10-01 NOTE — Anesthesia Procedure Notes (Signed)
Procedure Name: Intubation Date/Time: 10/01/2021 7:40 AM Performed by: Gentry Fitz, CRNA Pre-anesthesia Checklist: Patient identified, Emergency Drugs available, Suction available and Patient being monitored Patient Re-evaluated:Patient Re-evaluated prior to induction Oxygen Delivery Method: Circle system utilized Preoxygenation: Pre-oxygenation with 100% oxygen Induction Type: IV induction Ventilation: Mask ventilation without difficulty Laryngoscope Size: Mac and 4 Grade View: Grade II Tube type: Oral Tube size: 7.0 mm Number of attempts: 1 Airway Equipment and Method: Stylet and Oral airway Placement Confirmation: ETT inserted through vocal cords under direct vision, positive ETCO2 and breath sounds checked- equal and bilateral Secured at: 22 cm Tube secured with: Tape Dental Injury: Teeth and Oropharynx as per pre-operative assessment

## 2021-10-01 NOTE — Interval H&P Note (Signed)
History and Physical Interval Note:  10/01/2021 7:16 AM  [name] [name]  has presented today for surgery, with the diagnosis of right inguinal hernia, umbilical hernia.  The various methods of treatment have been discussed with the patient and family. After consideration of risks, benefits and other options for treatment, the patient has consented to  Procedure(s): XI ROBOTIC ASSISTED INGUINAL HERNIA (Right) HERNIA REPAIR UMBILICAL ADULT, open (N/A) as a surgical intervention.  The patient's history has been reviewed, patient examined, no change in status, stable for surgery.  I have reviewed the patient's chart and labs.  Questions were answered to the patient's satisfaction.     Waymond Meador

## 2021-10-01 NOTE — Op Note (Signed)
Procedure Date:  10/01/2021  Pre-operative Diagnosis:  Right inguinal hernia, umbilical hernia  Post-operative Diagnosis: Bilateral inguinal hernia, umbilical hernia  Procedure: 1.  Robotic assisted Bilateral Inguinal Hernia Repair 2.  Creation of Bilateral Posterior Rectus-Transversalis Fascia Advancment Flap for Coverage of Pelvic Wound (200 cm) 3.  Open umbilical hernia repair  Surgeon:  Melvyn Neth, MD  Assistant:  Amado Coe, PA-S  Anesthesia:  General endotracheal  Estimated Blood Loss:  15 ml  Specimens:  None  Complications:  None  Indications for Procedure:  This is a 76 y.o. female who presents with a right inguinal hernia and umbilical hernia.  The options of surgery versus observation were reviewed with the patient and/or family. The risks of bleeding, abscess or infection, recurrence of symptoms, potential for an open procedure, injury to surrounding structures, and chronic pain were all discussed with the patient and she was willing to proceed.  We have planned this transabdominal procedure with the creation of a right peritoneal flap based on the posterior rectus sheath and transversalis fascia in order to fully cover the mesh, creating a natural tisssue barrier for the bowel and peritoneal cavity.  Discussed also with her that if she were to have a hernia on the left groin, we would fix it at the same time in the same fashion.  Description of Procedure: The patient was correctly identified in the preoperative area and brought into the operating room.  The patient was placed supine with VTE prophylaxis in place.  Appropriate time-outs were performed.  Anesthesia was induced and the patient was intubated.  Foley catheter was placed.  Appropriate antibiotics were infused.  The abdomen was prepped and draped in a sterile fashion. A supraumbilical incision was made. Cautery was used to dissect down along the umbilical stalk.  The stalk was separated from the  underlying fascia, revealing a 7 mm hernia defect.  The fascial edges were cleared and the defect was extended in size to fit a 12 mm port.  Pneumoperitoneum was obtained with appropriate opening pressures.  A Veress needle was used to start dissecting the peritoneal flap.  Two 8-mm robotic ports were placed in the right and left lateral positions under direct visualization.  The patient was initially noted to have a right inguinal hernia.  A right large 3D Max Mid Bard Mesh, a 2-0 Vicryl, and 2-0 vloc suture were placed through the umbilical port under direct visualization.  The [employer] platform was docked onto the patient, the camera was inserted and targeted, and the instruments were placed under direct visualization.  Both inguinal regions were inspected again for hernias and it was actually noted that the patient had bilateral direct inguinal hernias.  A left large 3D Max Bard Mid mesh and an additional 2-0 Vloc suture were inserted under direct visualization. We started on the right side.  Using electocautery, the peritoneal and posterior rectus tissue flap was created.  The peritoneum on the right side was scored from the median umbilical ligament laterally towards the ASIS.  The flap was mobilized using robotic scissors and the bipolar instruments, creating a plane along the posterior rectus sheath and transversalis fascia down to the pubic tubercle medially. It was then further mobilized laterally across the inguinal canal and femoral vessels and onto the psoas muscle. The inferior epigastric vessels were identified and preserved. This created a posterior rectus and peritoneal flap measuring roughly 17 cm x 12 cm.  The hernia sac and contents were reduced preserving all structures.  A right large Bard 3D Max Mid mesh was placed with good overlap along all the potential hernia defects and secured in place with 2-0 Vicryl along the medial superomedial and superolateral aspects.  We then proceeded to the  left side.  Using electocautery, the peritoneal and posterior rectus tissue flap was created.  The peritoneum on the left side was scored from the median umbilical ligament laterally towards the ASIS.  The flap was mobilized using robotic scissors and the bipolar instruments, creating a plane along the posterior rectus sheath and transversalis fascia down to the pubic tubercle medially. It was then further mobilized laterally across the inguinal canal and femoral vessels and onto the psoas muscle. The inferior epigastric vessels were identified and preserved. This created a posterior rectus and peritoneal flap measuring roughly 17 cm x 12 cm.  The hernia sac and contents were reduced preserving all structures.  A left large Bard 3D Max Mid mesh was placed with good overlap along all the potential hernia defects and secured in place with 2-0 Vicryl along the medial superomedial and superolateral aspects.  Then, the peritoneal flap on both sides was advanced over the mesh and carried over to close the defect. A running 2-0 V loc suture was used to approximate the edge of the flap onto the peritoneum on both sides.  All needles were removed under direct visualization.  The 8- mm ports were removed under direct visualization and the Hasson trocar was removed.  The hernia defect was closed using 0 vicryl suture.  The stalk was reattached to the fascia using 2-0 vicryl sutures.  Local anesthetic was infused in all incisions as well as a bilateral ilioinguinal block.  The umbilical incision was closed with 3-0 Vicryl and 4-0 Monocryl, and the other port incisions were closed with 4-0 Monocryl.  The wounds were cleaned and sealed with DermaBond.  Foley catheter was removed and the patient was emerged from anesthesia and extubated and brought to the recovery room for further management.  The patient tolerated the procedure well and all counts were correct at the end of the case.   Melvyn Neth, MD

## 2021-10-01 NOTE — Progress Notes (Signed)
Upon arrival to PACU from OR patient had a run of V-tach at 10:48 am. Pt responded verbally, denied chest pain or shortness of breath. Anesthesiologist notified. Strip printed from monitor and placed in chart. Dr. Hampton Abbot aware of v-tach. No new orders. Pt will continue to be monitored in PACU.

## 2021-10-01 NOTE — Anesthesia Postprocedure Evaluation (Signed)
Anesthesia Post Note  Patient: [name] [name]  Procedure(s) Performed: XI ROBOTIC ASSISTED INGUINAL HERNIA (Bilateral: Groin) HERNIA REPAIR UMBILICAL ADULT, open (Abdomen)  Anesthesia Type: General Anesthetic complications: no   No notable events documented.   Last Vitals:  Vitals:   10/01/21 1130 10/01/21 1145  BP: 131/79 121/82  Pulse: 74 75  Resp: 10 16  Temp:    SpO2: 95% 97%    Last Pain:  Vitals:   10/01/21 1145  TempSrc:   PainSc: Asleep                 Moshannon Blas

## 2021-10-12 DIAGNOSIS — K429 Umbilical hernia without obstruction or gangrene: Secondary | ICD-10-CM

## 2021-10-18 ENCOUNTER — Ambulatory Visit (INDEPENDENT_AMBULATORY_CARE_PROVIDER_SITE_OTHER): Payer: Medicare HMO | Admitting: Surgery

## 2021-10-18 ENCOUNTER — Other Ambulatory Visit: Payer: Self-pay

## 2021-10-18 ENCOUNTER — Encounter: Payer: Self-pay | Admitting: Surgery

## 2021-10-18 VITALS — BP 153/91 | HR 86 | Temp 98.6°F | Ht 66.0 in | Wt 182.4 lb

## 2021-10-18 DIAGNOSIS — K4020 Bilateral inguinal hernia, without obstruction or gangrene, not specified as recurrent: Secondary | ICD-10-CM

## 2021-10-18 DIAGNOSIS — Z09 Encounter for follow-up examination after completed treatment for conditions other than malignant neoplasm: Secondary | ICD-10-CM

## 2021-10-18 DIAGNOSIS — K429 Umbilical hernia without obstruction or gangrene: Secondary | ICD-10-CM

## 2021-10-18 NOTE — Progress Notes (Signed)
10/18/2021  HPI: [name] [name] is a 76 y.o. female s/p robotic assisted bilateral inguinal hernia repair and open umbilical hernia repair on 10/01/2021.  The patient presents today for follow-up.  Patient reports that she feels that on the right groin that there is a small mass that is firm but denies any pain associated with it.  She has been feeling well otherwise denies any significant pain at the incisions or on either groin.  Vital signs: BP (!) 153/91    Pulse 86    Temp 98.6 F (37 C) (Oral)    Ht 5\' 6"  (1.676 m)    Wt 182 lb 6.4 oz (82.7 kg)    SpO2 98%    BMI 29.44 kg/m    Physical Exam: Constitutional: No acute distress Abdomen: Soft, nondistended, nontender to palpation.  3 laparoscopic incisions are healing well and are clean, dry, intact without any evidence of umbilical hernia recurrence.  Inspection the bilateral groin areas, the patient does have a approximately 3 x 4 cm mass in the medial lower portion of the groin consistent with probably seroma formation given the size of the hernia sac.  In the left groin, the same is found, but smaller, measuring about 1 x 1 cm.  Assessment/Plan: This is a 76 y.o. female s/p robotic bilateral inguinal hernia repair and open umbilical hernia repair.  --Discussed with the patient that the incisions are healing well, and the areas of swelling or firmness that she feels are likely seromas after the surgery, particularly given the size on the right side.  These do not change with coughing or straining, so do not think these are recurrent hernias. --Discussed that if these have not improved over the next month, to contact us so we can order ultrasound to further evaluate, but these should be improving with time. --Follow up as needed.   Melvyn Neth, [facility]

## 2021-10-18 NOTE — Patient Instructions (Addendum)
If the two areas do not improve in a month, let us know and we can order an Ultrasound.   If you have any concerns or questions, please feel free to call our office. Follow up as needed.  GENERAL POST-OPERATIVE PATIENT INSTRUCTIONS   WOUND CARE INSTRUCTIONS:  Keep a dry clean dressing on the wound if there is drainage. The initial bandage may be removed after 24 hours.  Once the wound has quit draining you may leave it open to air.  If clothing rubs against the wound or causes irritation and the wound is not draining you may cover it with a dry dressing during the daytime.  Try to keep the wound dry and avoid ointments on the wound unless directed to do so.  If the wound becomes bright red and painful or starts to drain infected material that is not clear, please contact your physician immediately.  If the wound is mildly pink and has a thick firm ridge underneath it, this is normal, and is referred to as a healing ridge.  This will resolve over the next 4-6 weeks.  BATHING: You may shower if you have been informed of this by your surgeon. However, Please do not submerge in a tub, hot tub, or pool until incisions are completely sealed or have been told by your surgeon that you may do so.  DIET:  You may eat any foods that you can tolerate.  It is a good idea to eat a high fiber diet and take in plenty of fluids to prevent constipation.  If you do become constipated you may want to take a mild laxative or take ducolax tablets on a daily basis until your bowel habits are regular.  Constipation can be very uncomfortable, along with straining, after recent surgery.  ACTIVITY:  You are encouraged to cough and deep breath or use your incentive spirometer if you were given one, every 15-30 minutes when awake.  This will help prevent respiratory complications and low grade fevers post-operatively if you had a general anesthetic.  You may want to hug a pillow when coughing and sneezing to add additional support  to the surgical area, if you had abdominal or chest surgery, which will decrease pain during these times.  You are encouraged to walk and engage in light activity for the next two weeks.  You should not lift, push, or pull more than 15-20 pounds, until 11/12/2021 as it could put you at increased risk for complications.  Twenty pounds is roughly equivalent to a plastic bag of groceries. At that time- Listen to your body when lifting, if you have pain when lifting, stop and then try again in a few days. Soreness after doing exercises or activities of daily living is normal as you get back in to your normal routine.  MEDICATIONS:  Try to take narcotic medications and anti-inflammatory medications, such as tylenol, ibuprofen, naprosyn, etc., with food.  This will minimize stomach upset from the medication.  Should you develop nausea and vomiting from the pain medication, or develop a rash, please discontinue the medication and contact your physician.  You should not drive, make important decisions, or operate machinery when taking narcotic pain medication.  SUNBLOCK Use sun block to incision area over the next year if this area will be exposed to sun. This helps decrease scarring and will allow you avoid a permanent darkened area over your incision.  QUESTIONS:  Please feel free to call our office if you have any questions, and  we will be glad to assist you.

## 2021-11-25 ENCOUNTER — Other Ambulatory Visit: Payer: Self-pay | Admitting: Family Medicine

## 2022-02-06 DIAGNOSIS — H1045 Other chronic allergic conjunctivitis: Secondary | ICD-10-CM | POA: Diagnosis not present

## 2022-03-29 DIAGNOSIS — N200 Calculus of kidney: Secondary | ICD-10-CM

## 2022-03-29 HISTORY — DX: Calculus of kidney: N20.0

## 2022-04-20 ENCOUNTER — Other Ambulatory Visit: Payer: Self-pay | Admitting: Family Medicine

## 2022-04-20 DIAGNOSIS — I10 Essential (primary) hypertension: Secondary | ICD-10-CM

## 2022-04-26 ENCOUNTER — Emergency Department
Admission: EM | Admit: 2022-04-26 | Discharge: 2022-04-26 | Disposition: A | Discharge status: Home / Self Care | Payer: Medicare HMO | Attending: Student in an Organized Health Care Education/Training Program | Admitting: Student in an Organized Health Care Education/Training Program

## 2022-04-26 ENCOUNTER — Other Ambulatory Visit: Payer: Self-pay

## 2022-04-26 ENCOUNTER — Emergency Department: Payer: Medicare HMO

## 2022-04-26 DIAGNOSIS — R1031 Right lower quadrant pain: Secondary | ICD-10-CM | POA: Diagnosis not present

## 2022-04-26 DIAGNOSIS — N200 Calculus of kidney: Secondary | ICD-10-CM | POA: Diagnosis not present

## 2022-04-26 DIAGNOSIS — R110 Nausea: Secondary | ICD-10-CM | POA: Diagnosis not present

## 2022-04-26 DIAGNOSIS — K8020 Calculus of gallbladder without cholecystitis without obstruction: Secondary | ICD-10-CM | POA: Diagnosis not present

## 2022-04-26 DIAGNOSIS — K5730 Diverticulosis of large intestine without perforation or abscess without bleeding: Secondary | ICD-10-CM | POA: Diagnosis not present

## 2022-04-26 DIAGNOSIS — R109 Unspecified abdominal pain: Secondary | ICD-10-CM

## 2022-04-26 DIAGNOSIS — I10 Essential (primary) hypertension: Secondary | ICD-10-CM | POA: Diagnosis not present

## 2022-04-26 DIAGNOSIS — R6883 Chills (without fever): Secondary | ICD-10-CM | POA: Diagnosis not present

## 2022-04-26 DIAGNOSIS — N289 Disorder of kidney and ureter, unspecified: Secondary | ICD-10-CM | POA: Diagnosis not present

## 2022-04-26 LAB — CBC WITH DIFFERENTIAL/PLATELET
Abs Immature Granulocytes: 0.01 10*3/uL (ref 0.00–0.07)
Basophils Absolute: 0 10*3/uL (ref 0.0–0.1)
Basophils Relative: 0 %
Eosinophils Absolute: 0.1 10*3/uL (ref 0.0–0.5)
Eosinophils Relative: 2 %
HCT: 40.7 % (ref 36.0–46.0)
Hemoglobin: 13.2 g/dL (ref 12.0–15.0)
Immature Granulocytes: 0 %
Lymphocytes Relative: 24 %
Lymphs Abs: 1.1 10*3/uL (ref 0.7–4.0)
MCH: 31.2 pg (ref 26.0–34.0)
MCHC: 32.4 g/dL (ref 30.0–36.0)
MCV: 96.2 fL (ref 80.0–100.0)
Monocytes Absolute: 0.3 10*3/uL (ref 0.1–1.0)
Monocytes Relative: 7 %
Neutro Abs: 3 10*3/uL (ref 1.7–7.7)
Neutrophils Relative %: 67 %
Platelets: 183 10*3/uL (ref 150–400)
RBC: 4.23 MIL/uL (ref 3.87–5.11)
RDW: 14.2 % (ref 11.5–15.5)
WBC: 4.5 10*3/uL (ref 4.0–10.5)
nRBC: 0 % (ref 0.0–0.2)

## 2022-04-26 LAB — URINALYSIS, ROUTINE W REFLEX MICROSCOPIC
Bilirubin Urine: NEGATIVE
Glucose, UA: NEGATIVE mg/dL
Hgb urine dipstick: NEGATIVE
Ketones, ur: NEGATIVE mg/dL
Leukocytes,Ua: NEGATIVE
Nitrite: NEGATIVE
Protein, ur: NEGATIVE mg/dL
Specific Gravity, Urine: 1.006 (ref 1.005–1.030)
pH: 8 (ref 5.0–8.0)

## 2022-04-26 LAB — COMPREHENSIVE METABOLIC PANEL
ALT: 24 U/L (ref 0–44)
AST: 36 U/L (ref 15–41)
Albumin: 3.7 g/dL (ref 3.5–5.0)
Alkaline Phosphatase: 79 U/L (ref 38–126)
Anion gap: 8 (ref 5–15)
BUN: 8 mg/dL (ref 8–23)
CO2: 20 mmol/L — ABNORMAL LOW (ref 22–32)
Calcium: 8.7 mg/dL — ABNORMAL LOW (ref 8.9–10.3)
Chloride: 111 mmol/L (ref 98–111)
Creatinine, Ser: 0.4 mg/dL — ABNORMAL LOW (ref 0.44–1.00)
GFR, Estimated: >60 mL/min (ref >60)
Glucose, Bld: 102 mg/dL — ABNORMAL HIGH (ref 70–99)
Potassium: 3.7 mmol/L (ref 3.5–5.1)
Sodium: 139 mmol/L (ref 135–145)
Total Bilirubin: 1 mg/dL (ref 0.3–1.2)
Total Protein: 7.2 g/dL (ref 6.5–8.1)

## 2022-04-26 LAB — LIPASE, BLOOD: Lipase: 21 U/L (ref 11–51)

## 2022-04-26 MED ORDER — METOPROLOL SUCCINATE ER 50 MG PO TB24
25.0000 mg | ORAL_TABLET | Freq: Every day | ORAL | Status: DC
  Filled 2022-04-26: qty 1

## 2022-04-26 MED ORDER — IOHEXOL 300 MG/ML  SOLN
100.0000 mL | Freq: Once | INTRAMUSCULAR | Status: AC | PRN
  Administered 2022-04-26: 100 mL via INTRAVENOUS

## 2022-04-26 MED ORDER — AMLODIPINE BESYLATE 5 MG PO TABS
2.5000 mg | ORAL_TABLET | Freq: Once | ORAL | Status: AC
  Administered 2022-04-26: 2.5 mg via ORAL
  Filled 2022-04-26: qty 1

## 2022-04-26 MED ORDER — SODIUM CHLORIDE 0.9 % IV BOLUS
500.0000 mL | Freq: Once | INTRAVENOUS | Status: AC
  Administered 2022-04-26: 500 mL via INTRAVENOUS

## 2022-04-26 MED ORDER — ONDANSETRON HCL 4 MG/2ML IJ SOLN
4.0000 mg | Freq: Once | INTRAMUSCULAR | Status: AC
  Administered 2022-04-26: 4 mg via INTRAVENOUS
  Filled 2022-04-26: qty 2

## 2022-04-26 MED ORDER — OXYCODONE-ACETAMINOPHEN 5-325 MG PO TABS: 1.0000 | ORAL_TABLET | ORAL | 0 refills | Status: DC | PRN

## 2022-04-26 MED ORDER — POLYETHYLENE GLYCOL 3350 17 G PO PACK: 17.0000 g | PACK | Freq: Every day | ORAL | 0 refills | Status: DC

## 2022-04-26 MED ORDER — MORPHINE SULFATE (PF) 4 MG/ML IV SOLN
4.0000 mg | INTRAVENOUS | Status: DC | PRN
  Administered 2022-04-26: 4 mg via INTRAVENOUS
  Filled 2022-04-26: qty 1

## 2022-04-26 NOTE — ED Provider Notes (Signed)
[facility] Provider Note    Event Date/Time   First MD Initiated Contact with Patient 04/26/22 [phone]     (approximate)   History   Flank Pain   HPI  [name] [name] is a 76 y.o. female remote history of kidney stones still has her appendix presents to the ER for evaluation of right lower quadrant pain started few days ago progressively worsening.  Has had some nausea.  Has had some chills.  No measured fevers.  Denies any chest pain.  No diarrhea.  No dysuria.     Physical Exam   Triage Vital Signs: ED Triage Vitals  Enc Vitals Group     BP 04/26/22 0908 (!) 150/119     Pulse Rate 04/26/22 0908 74     Resp 04/26/22 0908 18     Temp 04/26/22 0921 98.6 F (37 C)     Temp Source 04/26/22 0921 Oral     SpO2 04/26/22 0908 99 %     Weight 04/26/22 0909 180 lb (81.6 kg)     Height 04/26/22 0909 '5\' 6"'$  (1.676 m)     Head Circumference --      Peak Flow --      Pain Score 04/26/22 0909 9     Pain Loc --      Pain Edu? --      Excl. in [place]? --     Most recent vital signs: Vitals:   04/26/22 1330 04/26/22 1350  BP: (!) 155/93 (!) 155/93  Pulse: 69 69  Resp: 17 17  Temp:  98.6 F (37 C)  SpO2: 100% 100%     Constitutional: Alert  Eyes: Conjunctivae are normal.  Head: Atraumatic. Nose: No congestion/rhinnorhea. Mouth/Throat: Mucous membranes are moist.   Neck: Painless ROM.  Cardiovascular:   Good peripheral circulation. Respiratory: Normal respiratory effort.  No retractions.  Gastrointestinal: Soft with mild ttp in rlq  Musculoskeletal:  no deformity Neurologic:  MAE spontaneously. No gross focal neurologic deficits are appreciated.  Skin:  Skin is warm, dry and intact. No rash noted. Psychiatric: Mood and affect are normal. Speech and behavior are normal.    ED Results / Procedures / Treatments   Labs (all labs ordered are listed, but only abnormal results are displayed) Labs Reviewed  URINALYSIS, ROUTINE W REFLEX MICROSCOPIC -  Abnormal; Notable for the following components:      Result Value   Color, Urine STRAW (*)    APPearance CLEAR (*)    All other components within normal limits  COMPREHENSIVE METABOLIC PANEL - Abnormal; Notable for the following components:   CO2 20 (*)    Glucose, Bld 102 (*)    Creatinine, Ser 0.40 (*)    Calcium 8.7 (*)    All other components within normal limits  CBC WITH DIFFERENTIAL/PLATELET  LIPASE, BLOOD     EKG  ED ECG REPORT I, Merlyn Lot, the attending physician, personally viewed and interpreted this ECG.   Date: 04/26/2022  EKG Time: 10:42  Rate: 80  Rhythm: sinus  Axis: normal  Intervals: normal qt  ST&T Change: no stemi, no depressions, pac    RADIOLOGY Please see ED Course for my review and interpretation.  I personally reviewed all radiographic images ordered to evaluate for the above acute complaints and reviewed radiology reports and findings.  These findings were personally discussed with the patient.  Please see medical record for radiology report.    PROCEDURES:  Critical Care performed:   Procedures  MEDICATIONS ORDERED IN ED: Medications  morphine (PF) 4 MG/ML injection 4 mg (4 mg Intravenous Given 04/26/22 1037)  sodium chloride 0.9 % bolus 500 mL (0 mLs Intravenous Stopped 04/26/22 1147)  ondansetron (ZOFRAN) injection 4 mg (4 mg Intravenous Given 04/26/22 1037)  amLODipine (NORVASC) tablet 2.5 mg (2.5 mg Oral Given 04/26/22 1044)  iohexol (OMNIPAQUE) 300 MG/ML solution 100 mL (100 mLs Intravenous Contrast Given 04/26/22 1205)     IMPRESSION / MDM / ASSESSMENT AND PLAN / ED COURSE  I reviewed the triage vital signs and the nursing notes.                              Differential diagnosis includes, but is not limited to, appendicitis, colitis, diverticulitis, kidney stone, pyelonephritis, mass, musculoskeletal strain  Presented to the ER for evaluation of symptoms as described above.  This presenting complaint could reflect a  potentially life-threatening illness therefore the patient will be placed on continuous pulse oximetry and telemetry for monitoring.  Laboratory evaluation will be sent to evaluate for the above complaints.  Will order IV fluids as well as IV morphine and IV antiemetic.  Will order CT imaging to evaluate for the above differential.    Clinical Course as of 04/26/22 1525  Sat Apr 26, 2022  1242 CT imaging on my review and interpretation does not show any evidence of hernia or perforation. [PR]  1330 Imaging is reassuring.  She feels well at this time.  She was working out in the yard yesterday may have muscular strain.  We discussed incidental findings on her CT imaging we discussed return precautions.  At this point do believe she stable and appropriate for outpatient follow-up. [PR]    Clinical Course User Index [PR] Merlyn Lot, MD    FINAL CLINICAL IMPRESSION(S) / ED DIAGNOSES   Final diagnoses:  Right flank pain  Right lower quadrant abdominal pain     Rx / DC Orders   ED Discharge Orders          Ordered    oxyCODONE-acetaminophen (PERCOCET) 5-325 MG tablet  Every 4 hours PRN        04/26/22 1315    polyethylene glycol (MIRALAX / GLYCOLAX) 17 g packet  Daily        04/26/22 1315             Note:  This document was prepared using Dragon voice recognition software and may include unintentional dictation errors.    Merlyn Lot, MD 04/26/22 1525

## 2022-04-26 NOTE — Discharge Instructions (Signed)
  1. No acute abnormality in the abdomen or pelvis. Specifically, no  findings of appendicitis or bowel obstruction.  2. A 1.1 cm pedunculated mass is incidentally noted in the gastric  antrum. Recommend further evaluation with endoscopy.  3. Cholelithiasis without findings of cholecystitis.  4. Diverticulosis coli without findings of diverticulitis.  5. Nonobstructive right nephrolithiasis.  6. Benign hepatic and renal cysts.      Electronically Signed    By: Ileana Roup M.D.    On: 04/26/2022 12:57

## 2022-04-26 NOTE — ED Notes (Signed)
Lt redrawn and sent to lab.

## 2022-04-26 NOTE — ED Triage Notes (Signed)
Right Flank and abd pain x several days. Denies N/V/D. Constant ache.

## 2022-04-28 ENCOUNTER — Telehealth: Payer: Self-pay | Admitting: Family Medicine

## 2022-04-28 DIAGNOSIS — Z1231 Encounter for screening mammogram for malignant neoplasm of breast: Secondary | ICD-10-CM

## 2022-04-28 NOTE — Telephone Encounter (Signed)
Patient called in to schedule her physical for October but she also needs an order for a Mammogram to be done at [facility]. Please assist patient further

## 2022-05-03 ENCOUNTER — Other Ambulatory Visit: Payer: Self-pay | Admitting: Family Medicine

## 2022-05-08 DIAGNOSIS — S39011A Strain of muscle, fascia and tendon of abdomen, initial encounter: Secondary | ICD-10-CM | POA: Diagnosis not present

## 2022-05-08 DIAGNOSIS — K317 Polyp of stomach and duodenum: Secondary | ICD-10-CM | POA: Diagnosis not present

## 2022-05-08 DIAGNOSIS — Z86010 Personal history of colonic polyps: Secondary | ICD-10-CM | POA: Diagnosis not present

## 2022-05-09 ENCOUNTER — Other Ambulatory Visit: Payer: Self-pay | Admitting: General Surgery

## 2022-05-16 ENCOUNTER — Other Ambulatory Visit: Payer: Self-pay | Admitting: General Surgery

## 2022-05-16 NOTE — Progress Notes (Signed)
Progress Notes - documented in this encounter Anahy Esh, Geronimo Boot, MD - 05/08/2022 4:15 PM EDT Formatting of this note is different from the original. Images from the original note were not included. Subjective:   Patient ID: [name] [name] is a 76 y.o. female.  HPI  The following portions of the patient's history were reviewed and updated as appropriate.  This a new patient is here today for: office visit. Here to discuss having and upper endoscopy. She did have an ED visit 04-26-22 for right lower quadrant pain, CT was completed. She states she has right lower abdominal pain as well as in her back. She admits to nausea but no vomiting and a loss of appetite. Weight loss maybe 5-10 pounds over the past 2 weeks.  The patient's 15 year old son was found dead by his spouse 2 weeks ago. Shortly after that she began to have her back and abdominal pain.  During her emergency department visit on April 26, 2022 she had laboratory and CT studies. These been independently reviewed.  She underwent bilateral inguinal and umbilical hernia repair by Durwin Reges April sequoia, MD on October 01, 2021 and had had no difficulties with recovery from this procedure.  Review of Systems  Constitutional: Negative for chills and fever.  Gastrointestinal: Positive for abdominal pain.   Chief Complaint  Patient presents with  New Patient    BP (!) 149/97  Pulse 100  Temp 36.8 C (98.2 F)  Ht 168.9 cm (5' 6.5")  Wt 79.8 kg (176 lb)  SpO2 97%  BMI 27.98 kg/m   Past Medical History:  Diagnosis Date  History of colitis 02/20/2016  Hyperlipidemia  Hypertension    Past Surgical History:  Procedure Laterality Date  COLONOSCOPY 11/12/2001  ABDOMINAL HYSTERECTOMY 2007  INCISIONAL BIOPSY BREAST Left 2007  COLONOSCOPY 55/73/2202  UMBILICAL HERNIA REPAIR 54/27/0623  LAPAROSCOPIC TUBAL LIGATION    OB History   Gravida  6  Para  5  Term   Preterm   AB   Living     SAB   IAB    Ectopic   Molar   Multiple   Live Births     Obstetric Comments  Age at first period 76 Age of first pregnancy 69      Social History   Socioeconomic History  Marital status: Married  Tobacco Use  Smoking status: Never  Smokeless tobacco: Never  Substance and Sexual Activity  Alcohol use: Never  Drug use: Never    No Known Allergies  Current Outpatient Medications  Medication Sig Dispense Refill  amLODIPine (NORVASC) 2.5 MG tablet Take 1 tablet by mouth once daily  aspirin 81 MG EC tablet Take 81 mg by mouth as directed 2 x week  atorvastatin (LIPITOR) 40 MG tablet Take 1 tablet by mouth once daily  metoprolol succinate (TOPROL-XL) 25 MG XL tablet Take 1 tablet by mouth once daily  multivit-min/iron/folic acid/K (ADULTS MULTIVITAMIN ORAL) Take by mouth once daily   No current facility-administered medications for this visit.   Family History  Problem Relation Age of Onset  High blood pressure (Hypertension) Mother  Hyperlipidemia (Elevated cholesterol) Mother  Diabetes Father  Heart disease Father  High blood pressure (Hypertension) Sister  Diabetes Sister  Kidney disease Sister  Stroke Sister  High blood pressure (Hypertension) Brother  Heart disease Brother  Diabetes Brother  Breast cancer Neg Hx  Colon cancer Neg Hx   Labs and Radiology:   April 26, 2022 laboratory:  WBC 4.0 - 10.5 K/uL 4.5  RBC 3.87 - 5.11 MIL/uL 4.23  Hemoglobin 12.0 - 15.0 g/dL 13.2  HCT 36.0 - 46.0 % 40.7  MCV 80.0 - 100.0 fL 96.2  MCH 26.0 - 34.0 pg 31.2  MCHC 30.0 - 36.0 g/dL 32.4  RDW 11.5 - 15.5 % 14.2  Platelets 150 - 400 K/uL 183  nRBC 0.0 - 0.2 % 0.0  Neutrophils Relative % % 67  Neutro Abs 1.7 - 7.7 K/uL 3.0   Sodium 135 - 145 mmol/L 139  Potassium 3.5 - 5.1 mmol/L 3.7  Comment: HEMOLYSIS AT THIS LEVEL MAY AFFECT RESULT Chloride 98 - 111 mmol/L 111  CO2 22 - 32 mmol/L 20 Low  Glucose, Bld 70 - 99 mg/dL 102 High  Comment: Glucose reference range applies  only to samples taken after fasting for at least 8 hours. BUN 8 - 23 mg/dL 8  Creatinine, Ser 0.44 - 1.00 mg/dL 0.40 Low  Calcium 8.9 - 10.3 mg/dL 8.7 Low  Total Protein 6.5 - 8.1 g/dL 7.2  Albumin 3.5 - 5.0 g/dL 3.7  AST 15 - 41 U/L 36  Comment: HEMOLYSIS AT THIS LEVEL MAY AFFECT RESULT ALT 0 - 44 U/L 24  Comment: HEMOLYSIS AT THIS LEVEL MAY AFFECT RESULT Alkaline Phosphatase 38 - 126 U/L 79  Total Bilirubin 0.3 - 1.2 mg/dL 1.0  Comment: HEMOLYSIS AT THIS LEVEL MAY AFFECT RESULT GFR, Estimated >60 mL/min >60  Comment: (NOTE)   CT scan of the abdomen and pelvis dated April 26, 2022:  This study was independently reviewed.  IMPRESSION: 1. No acute abnormality in the abdomen or pelvis. Specifically, no findings of appendicitis or bowel obstruction. 2. A 1.1 cm pedunculated mass is incidentally noted in the gastric antrum. Recommend further evaluation with endoscopy. 3. Cholelithiasis without findings of cholecystitis. 4. Diverticulosis coli without findings of diverticulitis. 5. Nonobstructive right nephrolithiasis. 6. Benign hepatic and renal cysts.  There is no evidence of gastric outlet obstruction.  Colonoscopy of August 04, 2018 showed a 10 mm polyp at the hepatic flexure.  DIAGNOSIS:  A. COLON POLYP, HEPATIC FLEXURE; HOT SNARE:  - TUBULAR ADENOMA, MULTIPLE FRAGMENTS.  - NEGATIVE FOR HIGH-GRADE DYSPLASIA AND MALIGNANCY.   Based on the 2020 [place] multisociety task force recommendations a repeat colonoscopy in 3 years would be appropriate rather than 5 years as originally recommended.  Objective:  Physical Exam Exam conducted with a chaperone present.  Constitutional:  Appearance: Normal appearance.  Cardiovascular:  Rate and Rhythm: Normal rate and regular rhythm.  Pulses: Normal pulses.  Heart sounds: Normal heart sounds.  Pulmonary:  Effort: Pulmonary effort is normal.  Breath sounds: Normal breath sounds.  Abdominal:  General: Abdomen is flat. Bowel sounds are  normal.  Palpations: Abdomen is soft.  Tenderness: There is no abdominal tenderness.  Hernia: No hernia is present.   Comments: Robotic port sites well-healed. No evidence of incisional hernia.  Musculoskeletal:  Cervical back: Neck supple.  Back:  Comments: Area of tenderness in the right flank and rolling around to the right mid abdominal area. No trigger point.  No evidence of lower extremity edema. No inguinal adenopathy.  Skin: General: Skin is warm and dry.  Neurological:  Mental Status: She is alert and oriented to person, place, and time.  Psychiatric:  Mood and Affect: Mood normal.  Behavior: Behavior normal.    Assessment:   Muscle strain, likely markedly aggravated by the recent death of her son.  Gastric polyp for which upper endoscopy is indicated.  Plan:   Endoscopy procedure was reviewed.  Patient amenable to proceed.  While she has made some improvement from her initial emergency room evaluation 2 weeks ago with her muscle symptoms, she was given a prescription for Robaxin 500 mg p.o. to use 3 times daily if needed.  She would be a candidate for repeat colonoscopy at this time. If amenable, will complete both upper and lower endoscopy in 1 setting.  This note is partially prepared by Karie Fetch, RN, acting as a scribe in the presence of Dr. Hervey Ard, MD.  The documentation recorded by the scribe accurately reflects the service I personally performed and the decisions made by me.   Robert Bellow, MD FACS  Electronically signed by Mayer Masker, MD at 05/14/2022 2:47 PM EDT

## 2022-05-30 ENCOUNTER — Ambulatory Visit
Admission: RE | Admit: 2022-05-30 | Discharge: 2022-05-30 | Disposition: A | Discharge status: Home / Self Care | Payer: Medicare HMO | Source: Ambulatory Visit | Attending: Family Medicine | Admitting: Family Medicine

## 2022-05-30 DIAGNOSIS — Z1231 Encounter for screening mammogram for malignant neoplasm of breast: Secondary | ICD-10-CM | POA: Diagnosis not present

## 2022-06-03 ENCOUNTER — Encounter: Payer: Self-pay | Admitting: General Surgery

## 2022-06-03 ENCOUNTER — Other Ambulatory Visit: Payer: Self-pay | Admitting: Family Medicine

## 2022-06-03 DIAGNOSIS — R921 Mammographic calcification found on diagnostic imaging of breast: Secondary | ICD-10-CM

## 2022-06-03 DIAGNOSIS — R928 Other abnormal and inconclusive findings on diagnostic imaging of breast: Secondary | ICD-10-CM

## 2022-06-03 NOTE — Anesthesia Preprocedure Evaluation (Signed)
Anesthesia Evaluation  Patient identified by MRN, date of birth, ID band Patient awake    Reviewed: Allergy & Precautions, NPO status , Patient's Chart, lab work & pertinent test results  Airway Mallampati: III  TM Distance: >3 FB Neck ROM: full    Dental  (+) Chipped   Pulmonary neg pulmonary ROS,    Pulmonary exam normal        Cardiovascular hypertension, Normal cardiovascular exam     Neuro/Psych negative neurological ROS  negative psych ROS   GI/Hepatic Neg liver ROS, GERD  ,  Endo/Other  negative endocrine ROS  Renal/GU negative Renal ROS  negative genitourinary   Musculoskeletal   Abdominal   Peds  Hematology negative hematology ROS (+)   Anesthesia Other Findings Past Medical History: No date: Chest pain, unspecified 02/20/2016: Colitis 12/11/2005: H/O left breast biopsy     Comment:  [facility], path report matched the clinical impression of               left breast lipoma No date: HLD (hyperlipidemia)     Comment:  mixed No date: HTN (hypertension)  Past Surgical History: 2007: ABDOMINAL HYSTERECTOMY     Comment:  total 2007: BREAST BIOPSY; Left     Comment:  Dr. Dwyane Luo office-benign 11/12/2001: COLONOSCOPY     Comment:  multiple sessile polyps found in rectum, 65m in size,               distributed diffusely, biopsies were taken. The polyps               extended from lower rectum to approximately 20 cm. They               were confluent in the area between 10-15cm. Small polyps               with central ulceration were noted. 08/04/2018: COLONOSCOPY WITH PROPOFOL; N/A     Comment:  Procedure: COLONOSCOPY WITH PROPOFOL;  Surgeon: BRobert Bellow MD;  Location: [facility] ENDOSCOPY;  Service:               Endoscopy;  Laterality: N/A; No date: TUBAL LIGATION 19/03/9891 UMBILICAL HERNIA REPAIR; N/A     Comment:  Procedure: HERNIA REPAIR UMBILICAL ADULT, open;                Surgeon:  POlean Ree MD;  Location: [facility] ORS;                Service: General;  Laterality: N/A;     Reproductive/Obstetrics negative OB ROS                             Anesthesia Physical Anesthesia Plan  ASA: 2  Anesthesia Plan: General   Post-op Pain Management:    Induction:   PONV Risk Score and Plan: Propofol infusion, TIVA and Treatment may vary due to age or medical condition  Airway Management Planned: Natural Airway  Additional Equipment:   Intra-op Plan:   Post-operative Plan:   Informed Consent:     Dental Advisory Given  Plan Discussed with: Anesthesiologist, CRNA and Surgeon  Anesthesia Plan Comments:         Anesthesia Quick Evaluation

## 2022-06-04 ENCOUNTER — Ambulatory Visit
Admission: RE | Admit: 2022-06-04 | Discharge: 2022-06-04 | Disposition: A | Discharge status: Home / Self Care | Payer: Medicare HMO | Attending: General Surgery | Admitting: General Surgery

## 2022-06-04 ENCOUNTER — Encounter
Admission: RE | Disposition: A | Discharge status: Home / Self Care | Payer: Self-pay | Source: Home / Self Care | Attending: General Surgery

## 2022-06-04 ENCOUNTER — Ambulatory Visit: Payer: Medicare HMO | Admitting: Anesthesiology

## 2022-06-04 ENCOUNTER — Encounter: Payer: Self-pay | Admitting: General Surgery

## 2022-06-04 DIAGNOSIS — K635 Polyp of colon: Secondary | ICD-10-CM | POA: Diagnosis not present

## 2022-06-04 DIAGNOSIS — K648 Other hemorrhoids: Secondary | ICD-10-CM | POA: Insufficient documentation

## 2022-06-04 DIAGNOSIS — D131 Benign neoplasm of stomach: Secondary | ICD-10-CM | POA: Diagnosis not present

## 2022-06-04 DIAGNOSIS — K5730 Diverticulosis of large intestine without perforation or abscess without bleeding: Secondary | ICD-10-CM | POA: Insufficient documentation

## 2022-06-04 DIAGNOSIS — D120 Benign neoplasm of cecum: Secondary | ICD-10-CM | POA: Diagnosis not present

## 2022-06-04 DIAGNOSIS — I10 Essential (primary) hypertension: Secondary | ICD-10-CM | POA: Diagnosis not present

## 2022-06-04 DIAGNOSIS — K2950 Unspecified chronic gastritis without bleeding: Secondary | ICD-10-CM | POA: Insufficient documentation

## 2022-06-04 DIAGNOSIS — D126 Benign neoplasm of colon, unspecified: Secondary | ICD-10-CM | POA: Diagnosis not present

## 2022-06-04 DIAGNOSIS — K31A0 Gastric intestinal metaplasia, unspecified: Secondary | ICD-10-CM | POA: Diagnosis not present

## 2022-06-04 DIAGNOSIS — K31A29 Gastric intestinal metaplasia with dysplasia, unspecified: Secondary | ICD-10-CM | POA: Diagnosis not present

## 2022-06-04 DIAGNOSIS — K317 Polyp of stomach and duodenum: Secondary | ICD-10-CM | POA: Insufficient documentation

## 2022-06-04 DIAGNOSIS — Z09 Encounter for follow-up examination after completed treatment for conditions other than malignant neoplasm: Secondary | ICD-10-CM | POA: Insufficient documentation

## 2022-06-04 DIAGNOSIS — Z86010 Personal history of colonic polyps: Secondary | ICD-10-CM | POA: Diagnosis not present

## 2022-06-04 HISTORY — PX: ESOPHAGOGASTRODUODENOSCOPY (EGD) WITH PROPOFOL: SHX5813

## 2022-06-04 HISTORY — PX: COLONOSCOPY WITH PROPOFOL: SHX5780

## 2022-06-04 SURGERY — ESOPHAGOGASTRODUODENOSCOPY (EGD) WITH PROPOFOL
Anesthesia: General

## 2022-06-04 MED ORDER — PROPOFOL 500 MG/50ML IV EMUL
INTRAVENOUS | Status: DC | PRN
  Administered 2022-06-04: 175 ug/kg/min via INTRAVENOUS

## 2022-06-04 MED ORDER — PROPOFOL 10 MG/ML IV BOLUS
INTRAVENOUS | Status: DC | PRN
  Administered 2022-06-04: 60 mg via INTRAVENOUS

## 2022-06-04 MED ORDER — SODIUM CHLORIDE 0.9 % IV SOLN: INTRAVENOUS | Status: DC

## 2022-06-04 MED ORDER — DEXMEDETOMIDINE HCL IN NACL 80 MCG/20ML IV SOLN
INTRAVENOUS | Status: AC
  Filled 2022-06-04: qty 20

## 2022-06-04 MED ORDER — PHENYLEPHRINE HCL (PRESSORS) 10 MG/ML IV SOLN
INTRAVENOUS | Status: DC | PRN
  Administered 2022-06-04 (×2): 80 ug via INTRAVENOUS

## 2022-06-04 MED ORDER — PHENYLEPHRINE 80 MCG/ML (10ML) SYRINGE FOR IV PUSH (FOR BLOOD PRESSURE SUPPORT)
PREFILLED_SYRINGE | INTRAVENOUS | Status: AC
  Filled 2022-06-04: qty 10

## 2022-06-04 MED ORDER — LIDOCAINE HCL (CARDIAC) PF 100 MG/5ML IV SOSY
PREFILLED_SYRINGE | INTRAVENOUS | Status: DC | PRN
  Administered 2022-06-04: 100 mg via INTRAVENOUS

## 2022-06-04 MED ORDER — EPHEDRINE SULFATE (PRESSORS) 50 MG/ML IJ SOLN
INTRAMUSCULAR | Status: DC | PRN
  Administered 2022-06-04: 7.5 mg via INTRAVENOUS

## 2022-06-04 MED ORDER — PROPOFOL 10 MG/ML IV BOLUS
INTRAVENOUS | Status: AC
  Filled 2022-06-04: qty 20

## 2022-06-04 MED ORDER — EPHEDRINE 5 MG/ML INJ
INTRAVENOUS | Status: AC
  Filled 2022-06-04: qty 5

## 2022-06-04 MED ORDER — DEXMEDETOMIDINE HCL 200 MCG/2ML IV SOLN
INTRAVENOUS | Status: DC | PRN
  Administered 2022-06-04: 12 ug via INTRAVENOUS

## 2022-06-04 NOTE — Transfer of Care (Signed)
Immediate Anesthesia Transfer of Care Note  Patient: [name] [name]  Procedure(s) Performed: ESOPHAGOGASTRODUODENOSCOPY (EGD) WITH PROPOFOL COLONOSCOPY WITH PROPOFOL  Patient Location: Endoscopy Unit  Anesthesia Type:General  Level of Consciousness: drowsy  Airway & Oxygen Therapy: Patient Spontanous Breathing  Post-op Assessment: Report given to RN and Post -op Vital signs reviewed and stable  Post vital signs: Reviewed and stable  Last Vitals:  Vitals Value Taken Time  BP 91/66 06/04/22 1026  Temp 35.8 C 06/04/22 1026  Pulse 64 06/04/22 1026  Resp 15 06/04/22 1026  SpO2 98 % 06/04/22 1026    Last Pain:  Vitals:   06/04/22 1026  TempSrc: Temporal  PainSc: Asleep         Complications: No notable events documented.

## 2022-06-04 NOTE — H&P (Signed)
[name] [name] [id number] [birth year]     HPI:  Medications Prior to Admission  Medication Sig Dispense Refill Last Dose   acetaminophen (TYLENOL) 500 MG tablet Take 1,000 mg by mouth every 6 (six) hours as needed (for pain.).   Past Month   acetaminophen (TYLENOL) 500 MG tablet Take 2 tablets (1,000 mg total) by mouth every 6 (six) hours as needed for mild pain.   Past Month   amLODipine (NORVASC) 2.5 MG tablet TAKE 1 TABLET EVERY DAY 90 tablet 0 06/04/2022   atorvastatin (LIPITOR) 40 MG tablet TAKE 1 TABLET EVERY DAY 90 tablet 3 06/03/2022   metoprolol succinate (TOPROL-XL) 25 MG 24 hr tablet Take 1 tablet (25 mg total) by mouth daily. 90 tablet 2 06/03/2022   aspirin EC 81 MG tablet Take 81 mg by mouth every other day. Swallow whole.   06/01/2022   Cholecalciferol (VITAMIN D3 PO) Take 5,000 Units by mouth in the morning.      ibuprofen (ADVIL) 600 MG tablet Take 1 tablet (600 mg total) by mouth every 8 (eight) hours as needed for moderate pain. 60 tablet 1 06/02/2022   Multiple Vitamin (MULTIVITAMIN WITH MINERALS) TABS tablet Take 1 tablet by mouth in the morning. Centrum for Women      oxyCODONE-acetaminophen (PERCOCET) 5-325 MG tablet Take 1 tablet by mouth every 4 (four) hours as needed for severe pain. 6 tablet 0    polyethylene glycol (MIRALAX / GLYCOLAX) 17 g packet Take 17 g by mouth daily. Mix one tablespoon with 8oz of your favorite juice or water every day until you are having soft formed stools. Then start taking once daily if you didn't have a stool the day before. 30 each 0    Not on File Past Medical History:  Diagnosis Date   Chest pain, unspecified    Colitis 02/20/2016   H/O left breast biopsy 12/11/2005   [facility], path report matched the clinical impression of left breast lipoma   HLD (hyperlipidemia)    mixed   HTN (hypertension)    Kidney stones 03/2022   Past Surgical History:  Procedure Laterality Date   ABDOMINAL HYSTERECTOMY  2007   total   BREAST BIOPSY Left 2007    Dr. Dwyane Luo office-benign   COLONOSCOPY  11/12/2001   multiple sessile polyps found in rectum, 31m in size, distributed diffusely, biopsies were taken. The polyps extended from lower rectum to approximately 20 cm. They were confluent in the area between 10-15cm. Small polyps with central ulceration were noted.   COLONOSCOPY WITH PROPOFOL N/A 08/04/2018   Procedure: COLONOSCOPY WITH PROPOFOL;  Surgeon: BRobert Bellow MD;  Location: [facility] ENDOSCOPY;  Service: Endoscopy;  Laterality: N/A;   TUBAL LIGATION     UMBILICAL HERNIA REPAIR N/A 10/01/2021   Procedure: HERNIA REPAIR UMBILICAL ADULT, open;  Surgeon: POlean Ree MD;  Location: [facility] ORS;  Service: General;  Laterality: N/A;   Social History   Socioeconomic History   Marital status: Married    Spouse name: RHerbie [place]   Number of children: 5   Years of education: HS   Highest education level: 12th grade  Occupational History   Occupation: retired  Tobacco Use   Smoking status: Never   Smokeless tobacco: Never   Tobacco comments:    tobacco use - no  Vaping Use   Vaping Use: Never used  Substance and Sexual Activity   Alcohol use: No   Drug use: No   Sexual activity: Not on file  Other Topics  Concern   Not on file  Social History Narrative   Married, full time, exercise - walks some.   Social Determinants of Health   Financial Resource Strain: Low Risk  (03/20/2020)   Overall Financial Resource Strain (CARDIA)    Difficulty of Paying Living Expenses: Not hard at all  Food Insecurity: No Food Insecurity (03/20/2020)   Hunger Vital Sign    Worried About Running Out of Food in the Last Year: Never true    Ran Out of Food in the Last Year: Never true  Transportation Needs: No Transportation Needs (03/20/2020)   PRAPARE - Hydrologist (Medical): No    Lack of Transportation (Non-Medical): No  Physical Activity: Inactive (03/20/2020)   Exercise Vital Sign    Days of Exercise per Week: 0 days     Minutes of Exercise per Session: 0 min  Stress: No Stress Concern Present (03/20/2020)   [place]    Feeling of Stress : Only a little  Social Connections: Socially Integrated (03/20/2020)   Social Connection and Isolation Panel [NHANES]    Frequency of Communication with Friends and Family: Three times a week    Frequency of Social Gatherings with Friends and Family: Three times a week    Attends Religious Services: More than 4 times per year    Active Member of Clubs or Organizations: Yes    Attends Archivist Meetings: 1 to 4 times per year    Marital Status: Married  Human resources officer Violence: Not At Risk (03/20/2020)   Humiliation, Afraid, Rape, and Kick questionnaire    Fear of Current or Ex-Partner: No    Emotionally Abused: No    Physically Abused: No    Sexually Abused: No   Social History   Social History Narrative   Married, full time, exercise - walks some.     ROS: Negative.     PE: HEENT: Negative. Lungs: Clear. Cardio: RR. Forest Gleason Jonuel Butterfield 06/04/2022   Assessment/Plan:  Proceed with planned upper endoscopy and likely polypectomy followed by repeat lower endoscopy to assess prior 10 mm polyp removed from the hepatic flexure .

## 2022-06-04 NOTE — Op Note (Signed)
[facility] Gastroenterology Patient Name: [name] [name] Procedure Date: 06/04/2022 9:33 AM MRN: [id number] Account #: [id number] Date of Birth: 10-10-1945 Admit Type: Outpatient Age: 76 Room: [facility] ENDO ROOM 1 Gender: Female Note Status: Finalized Instrument Name: Peds Colonoscope [id number] Procedure:             Colonoscopy Indications:           High risk colon cancer surveillance: Personal history                         of colonic polyps Providers:             Robert Bellow, MD Medicines:             Propofol per Anesthesia Complications:         No immediate complications. Procedure:             Pre-Anesthesia Assessment:                        - Prior to the procedure, a History and Physical was                         performed, and patient medications, allergies and                         sensitivities were reviewed. The patient's tolerance                         of previous anesthesia was reviewed.                        - The risks and benefits of the procedure and the                         sedation options and risks were discussed with the                         patient. All questions were answered and informed                         consent was obtained.                        After obtaining informed consent, the colonoscope was                         passed under direct vision. Throughout the procedure,                         the patient's blood pressure, pulse, and oxygen                         saturations were monitored continuously. The                         Colonoscope was introduced through the anus and                         advanced to the the cecum, identified by appendiceal  orifice and ileocecal valve. The colonoscopy was                         performed without difficulty. The patient tolerated                         the procedure well. The quality of the bowel                         preparation was  excellent. Findings:      A 5 mm polyp was found in the cecum. The polyp was sessile. The polyp       was removed with a hot snare. Resection and retrieval were complete.      A 9 mm polyp was found in the cecum. The polyp was sessile. The polyp       was removed with a cold snare. Resection and retrieval were complete.      Many large-mouthed diverticula were found in the ascending colon,       proximal ascending colon and mid ascending colon.      Multiple medium-mouthed diverticula were found in the sigmoid colon.      Internal hemorrhoids were found during retroflexion. The hemorrhoids       were medium-sized. Impression:            - One 5 mm polyp in the cecum, removed with a hot                         snare. Resected and retrieved.                        - One 9 mm polyp in the cecum, removed with a cold                         snare. Resected and retrieved.                        - Diverticulosis in the ascending colon, in the                         proximal ascending colon and in the mid ascending                         colon.                        - Diverticulosis in the sigmoid colon.                        - Internal hemorrhoids. Recommendation:        - Telephone endoscopist for pathology results in 1                         week. Procedure Code(s):     --- Professional ---                        [phone], Colonoscopy, flexible; with removal of                         tumor(s), polyp(s), or other lesion(s)  by snare                         technique Diagnosis Code(s):     --- Professional ---                        Z86.010, Personal history of colonic polyps                        K63.5, Polyp of colon                        K64.8, Other hemorrhoids                        K57.30, Diverticulosis of large intestine without                         perforation or abscess without bleeding CPT copyright 2019 American Medical Association. All rights reserved. The codes  documented in this report are preliminary and upon coder review may  be revised to meet current compliance requirements. Robert Bellow, MD 06/04/2022 10:27:25 AM This report has been signed electronically. Number of Addenda: 0 Note Initiated On: 06/04/2022 9:33 AM Scope Withdrawal Time: 0 hours 15 minutes 36 seconds  Total Procedure Duration: 0 hours 20 minutes 19 seconds  Estimated Blood Loss:  Estimated blood loss was minimal.      [facility]

## 2022-06-04 NOTE — Anesthesia Postprocedure Evaluation (Signed)
Anesthesia Post Note  Patient: [name] [name]  Procedure(s) Performed: ESOPHAGOGASTRODUODENOSCOPY (EGD) WITH PROPOFOL COLONOSCOPY WITH PROPOFOL  Patient location during evaluation: PACU Anesthesia Type: General Level of consciousness: awake and alert Pain management: pain level controlled Vital Signs Assessment: post-procedure vital signs reviewed and stable Respiratory status: spontaneous breathing, nonlabored ventilation and respiratory function stable Cardiovascular status: blood pressure returned to baseline and stable Postop Assessment: no apparent nausea or vomiting Anesthetic complications: no   No notable events documented.   Last Vitals:  Vitals:   06/04/22 1036 06/04/22 1046  BP: 127/83 (!) 143/93  Pulse: 72   Resp: 20   Temp:    SpO2: 100%     Last Pain:  Vitals:   06/04/22 1046  TempSrc:   PainSc: 0-No pain                 [place] [facility]

## 2022-06-04 NOTE — Op Note (Signed)
[facility] Gastroenterology Patient Name: [name] [name] Procedure Date: 06/04/2022 9:33 AM MRN: [id number] Account #: [id number] Date of Birth: [birth year] Admit Type: Outpatient Age: 76 Room: [facility] ENDO ROOM 1 Gender: Female Note Status: Finalized Instrument Name: Upper Endoscope [id number] Procedure:             Upper GI endoscopy Indications:           Abnormal CT of the GI tract Providers:             Robert Bellow, MD Medicines:             Propofol per Anesthesia Complications:         No immediate complications. Procedure:             Pre-Anesthesia Assessment:                        - Prior to the procedure, a History and Physical was                         performed, and patient medications, allergies and                         sensitivities were reviewed. The patient's tolerance                         of previous anesthesia was reviewed.                        - The risks and benefits of the procedure and the                         sedation options and risks were discussed with the                         patient. All questions were answered and informed                         consent was obtained.                        After obtaining informed consent, the endoscope was                         passed under direct vision. Throughout the procedure,                         the patient's blood pressure, pulse, and oxygen                         saturations were monitored continuously. The Endoscope                         was introduced through the mouth, and advanced to the                         second part of duodenum. The upper GI endoscopy was                         accomplished without difficulty. The patient tolerated  the procedure well. Findings:      The esophagus was normal.      The examined duodenum was normal.      A single 12 mm pedunculated polyp with no bleeding and no stigmata of       recent bleeding was  found on the posterior wall of the stomach. The       polyp was removed with a hot snare. Resection and retrieval were       complete. To prevent bleeding post-intervention, two hemostatic clips       were successfully placed (MR conditional). There was no bleeding during,       or at the end, of the procedure. Impression:            - Normal esophagus.                        - Normal examined duodenum.                        - A single gastric polyp. Resected and retrieved.                         Clips (MR conditional) were placed. Recommendation:        - Telephone endoscopist for pathology results in 1                         week.                        - Perform a colonoscopy today. Procedure Code(s):     --- Professional ---                        [phone], Esophagogastroduodenoscopy, flexible,                         transoral; with removal of tumor(s), polyp(s), or                         other lesion(s) by snare technique Diagnosis Code(s):     --- Professional ---                        K31.7, Polyp of stomach and duodenum                        R93.3, Abnormal findings on diagnostic imaging of                         other parts of digestive tract CPT copyright 2019 American Medical Association. All rights reserved. The codes documented in this report are preliminary and upon coder review may  be revised to meet current compliance requirements. Robert Bellow, MD 06/04/2022 10:00:59 AM This report has been signed electronically. Number of Addenda: 0 Note Initiated On: 06/04/2022 9:33 AM Estimated Blood Loss:  Estimated blood loss was minimal.      [facility]

## 2022-06-05 ENCOUNTER — Encounter: Payer: Self-pay | Admitting: General Surgery

## 2022-06-05 LAB — SURGICAL PATHOLOGY

## 2022-06-10 ENCOUNTER — Encounter: Payer: Self-pay | Admitting: Family Medicine

## 2022-06-10 ENCOUNTER — Other Ambulatory Visit: Payer: Self-pay | Admitting: General Surgery

## 2022-06-10 DIAGNOSIS — K2970 Gastritis, unspecified, without bleeding: Secondary | ICD-10-CM | POA: Diagnosis not present

## 2022-06-10 DIAGNOSIS — B9681 Helicobacter pylori [H. pylori] as the cause of diseases classified elsewhere: Secondary | ICD-10-CM | POA: Diagnosis not present

## 2022-06-10 NOTE — Progress Notes (Signed)
Progress Notes - documented in this encounter Madgie Dhaliwal, Geronimo Boot, MD - 06/10/2022 2:45 PM EDT Formatting of this note is different from the original. Subjective:   Patient ID: [name] [name] is a 76 y.o. female.  HPI  The following portions of the patient's history were reviewed and updated as appropriate.  This an established patient is here today for: office visit. The patient is here today to discuss results from upper and lower endoscopy done on 06-04-22. The patient tolerated the endoscopy well. She had been contacted regarding the findings of H. pylori.  Chief Complaint  Patient presents with  Follow-up    BP (!) 146/88  Pulse 71  Temp 36.7 C (98.1 F)  Ht 168.9 cm (5' 6.5")  Wt 82.1 kg (181 lb)  SpO2 98%  BMI 28.78 kg/m   Past Medical History:  Diagnosis Date  History of colitis 02/20/2016  Hyperlipidemia  Hypertension    Past Surgical History:  Procedure Laterality Date  COLONOSCOPY 11/12/2001  ABDOMINAL HYSTERECTOMY 2007  INCISIONAL BIOPSY BREAST Left 2007  COLONOSCOPY 34/28/7681  UMBILICAL HERNIA REPAIR 15/72/6203  COLONOSCOPY 06/04/2022  EGD 06/04/2022  LAPAROSCOPIC TUBAL LIGATION    OB History   Gravida  6  Para  5  Term   Preterm   AB   Living     SAB   IAB   Ectopic   Molar   Multiple   Live Births     Obstetric Comments  Age at first period 11 Age of first pregnancy 31      Social History   Socioeconomic History  Marital status: Married  Tobacco Use  Smoking status: Never  Smokeless tobacco: Never  Substance and Sexual Activity  Alcohol use: Never  Drug use: Never    No Known Allergies  Current Outpatient Medications  Medication Sig Dispense Refill  amLODIPine (NORVASC) 2.5 MG tablet Take 1 tablet by mouth once daily  aspirin 81 MG EC tablet Take 81 mg by mouth as directed 2 x week  atorvastatin (LIPITOR) 40 MG tablet Take 1 tablet by mouth once daily  metoprolol succinate (TOPROL-XL) 25 MG  XL tablet Take 1 tablet by mouth once daily  multivit-min/iron/folic acid/K (ADULTS MULTIVITAMIN ORAL) Take by mouth once daily   No current facility-administered medications for this visit.   Family History  Problem Relation Age of Onset  High blood pressure (Hypertension) Mother  Hyperlipidemia (Elevated cholesterol) Mother  Diabetes Father  Heart disease Father  High blood pressure (Hypertension) Sister  Diabetes Sister  Kidney disease Sister  Stroke Sister  High blood pressure (Hypertension) Brother  Heart disease Brother  Diabetes Brother  Breast cancer Neg Hx  Colon cancer Neg Hx   Labs and Radiology:   Gastric polyp endoscopically removed June 04, 2022:  DIAGNOSIS:  [place]rick; HOT SNARE:  - INTESTINAL-TYPE GASTRIC ADENOMA, HIGH-GRADE, SEE COMMENT.  - BACKGROUND MODERATE CHRONIC ACTIVE GASTRITIS WITH FOCAL INTESTINAL  METAPLASIA; IHC FOR H. PYLORI WILL BE REPORTED IN AN ADDENDUM.  - NEGATIVE FOR MALIGNANCY.   B. COLON POLYP X 2, CECUM; HOT AND COLD SNARE:  - TUBULAR ADENOMA (MULTIPLE FRAGMENTS).  - NEGATIVE FOR HIGH-GRADE DYSPLASIA AND MALIGNANCY.   An IHC stain for H. pylori highlights Helicobacter organisms. These  findings are consistent with Helicobacter-associated gastritis.  Review of Systems  Constitutional: Negative for chills and fever.  Respiratory: Negative for cough.    Objective:  Physical Exam Constitutional:  Appearance: Normal appearance.  Neurological:  Mental Status: She is alert and oriented  to person, place, and time.  Psychiatric:  Mood and Affect: Mood normal.  Behavior: Behavior normal.    Assessment:   H. pylori gastritis with sizable gastric polyp, resected.  Plan:   Indications for quadruple therapy to have maximum chance of eradication of the chronic infection was discussed. Lower risk of gastric cancer in those with adequately treated H. pylori.  Indication to avoid calcium-containing substances such as milk,  soft cheese or yogurt 2 hours before or 2 hours after of dosing of the doxycycline.  Encouraged to make use of probiotics during antibiotic therapy to minimize potential for vaginal yeast infection.  We will plan for repeat endoscopy and biopsy 1 month posttreatment.  This note is partially prepared by Ledell Noss, CMA acting as a scribe in the presence of Dr. Hervey Ard, MD.   The documentation recorded by the scribe accurately reflects the service I personally performed and the decisions made by me.   Robert Bellow, MD FACS   Electronically signed by Mayer Masker, MD at 06/10/2022 3:30 PM EDT

## 2022-06-18 ENCOUNTER — Telehealth: Payer: Self-pay | Admitting: Family Medicine

## 2022-06-18 ENCOUNTER — Ambulatory Visit
Admission: RE | Admit: 2022-06-18 | Discharge: 2022-06-18 | Disposition: A | Discharge status: Home / Self Care | Payer: Medicare HMO | Source: Ambulatory Visit | Attending: Family Medicine | Admitting: Family Medicine

## 2022-06-18 DIAGNOSIS — R928 Other abnormal and inconclusive findings on diagnostic imaging of breast: Secondary | ICD-10-CM | POA: Insufficient documentation

## 2022-06-18 DIAGNOSIS — R921 Mammographic calcification found on diagnostic imaging of breast: Secondary | ICD-10-CM | POA: Diagnosis not present

## 2022-06-18 NOTE — Telephone Encounter (Signed)
Copied from [place] [id number]. Topic: Medicare AWV >> Jun 18, 2022  1:36 PM Jae Dire wrote: Reason for CRM:  Left message for patient to call back and schedule Medicare Annual Wellness Visit (AWV) in office.   If unable to come into the office for AWV,  please offer to do virtually or by telephone.  Last AWV: 06/28/2021  Please schedule at anytime with [facility] Health Advisor.  30 minute appointment for Virtual or phone 45 minute appointment for in office or Initial virtual/phone  Any questions, please contact me at [phone]

## 2022-07-07 ENCOUNTER — Encounter: Payer: Self-pay | Admitting: Family Medicine

## 2022-07-07 ENCOUNTER — Ambulatory Visit (INDEPENDENT_AMBULATORY_CARE_PROVIDER_SITE_OTHER): Payer: Medicare HMO | Admitting: Family Medicine

## 2022-07-07 VITALS — BP 124/88 | HR 74 | Resp 16 | Ht 66.0 in | Wt 179.0 lb

## 2022-07-07 DIAGNOSIS — I779 Disorder of arteries and arterioles, unspecified: Secondary | ICD-10-CM

## 2022-07-07 DIAGNOSIS — Z23 Encounter for immunization: Secondary | ICD-10-CM

## 2022-07-07 DIAGNOSIS — Z0000 Encounter for general adult medical examination without abnormal findings: Secondary | ICD-10-CM | POA: Diagnosis not present

## 2022-07-07 DIAGNOSIS — E7800 Pure hypercholesterolemia, unspecified: Secondary | ICD-10-CM | POA: Diagnosis not present

## 2022-07-07 DIAGNOSIS — I10 Essential (primary) hypertension: Secondary | ICD-10-CM | POA: Diagnosis not present

## 2022-07-07 NOTE — Patient Instructions (Signed)
.   Please review the attached list of medications and notify my office if there are any errors.   . Please bring all of your medications to every appointment so we can make sure that our medication list is the same as yours.   

## 2022-07-07 NOTE — Progress Notes (Unsigned)
I,Tiffany J Bragg,acting as a scribe for Lelon Huh, MD.,have documented all relevant documentation on the behalf of Lelon Huh, MD,as directed by  Lelon Huh, MD while in the presence of Lelon Huh, MD.  Complete physical exam   Patient: [name] [name]   DOB: 07/13/1946   76 y.o. Female  MRN: [id number] Visit Date: 07/07/2022  Today's healthcare provider: Lelon Huh, MD   Chief Complaint  Patient presents with   Annual Exam   Hypertension   Subjective    [name] [name] is a 76 y.o. female who presents today for a complete physical exam.  She reports consuming a general diet. Home exercise routine includes walking, yardwork. She generally feels well. She reports sleeping poorly. She does not have additional problems to discuss today.   Also due for hypertension and hyperlipidemia follow up. Feels well with no side effects from medications which she takes consistently.   Past Medical History:  Diagnosis Date   Chest pain, unspecified    Colitis 02/20/2016   H/O left breast biopsy 12/11/2005   [facility], path report matched the clinical impression of left breast lipoma   HLD (hyperlipidemia)    mixed   HTN (hypertension)    Kidney stones 03/2022   Past Surgical History:  Procedure Laterality Date   ABDOMINAL HYSTERECTOMY  2007   total   BREAST BIOPSY Left 2007   Dr. Dwyane Luo office-benign   COLONOSCOPY  11/12/2001   multiple sessile polyps found in rectum, 32m in size, distributed diffusely, biopsies were taken. The polyps extended from lower rectum to approximately 20 cm. They were confluent in the area between 10-15cm. Small polyps with central ulceration were noted.   COLONOSCOPY WITH PROPOFOL N/A 08/04/2018   Procedure: COLONOSCOPY WITH PROPOFOL;  Surgeon: BRobert Bellow MD;  Location: [facility] ENDOSCOPY;  Service: Endoscopy;  Laterality: N/A;   COLONOSCOPY WITH PROPOFOL N/A 06/04/2022   Procedure: COLONOSCOPY WITH PROPOFOL;  Surgeon: BRobert Bellow MD;   Location: [facility] ENDOSCOPY;  Service: Endoscopy;  Laterality: N/A;   ESOPHAGOGASTRODUODENOSCOPY (EGD) WITH PROPOFOL N/A 06/04/2022   Procedure: ESOPHAGOGASTRODUODENOSCOPY (EGD) WITH PROPOFOL;  Surgeon: BRobert Bellow MD;  Location: [facility] ENDOSCOPY;  Service: Endoscopy;  Laterality: N/A;  with polypectomy   TUBAL LIGATION     UMBILICAL HERNIA REPAIR N/A 10/01/2021   Procedure: HERNIA REPAIR UMBILICAL ADULT, open;  Surgeon: POlean Ree MD;  Location: [facility] ORS;  Service: General;  Laterality: N/A;   Social History   Socioeconomic History   Marital status: Married    Spouse name: RHerbie [place]   Number of children: 5   Years of education: HS   Highest education level: 12th grade  Occupational History   Occupation: retired  Tobacco Use   Smoking status: Never   Smokeless tobacco: Never   Tobacco comments:    tobacco use - no  Vaping Use   Vaping Use: Never used  Substance and Sexual Activity   Alcohol use: No   Drug use: No   Sexual activity: Not on file  Other Topics Concern   Not on file  Social History Narrative   Married, full time, exercise - walks some.   Social Determinants of Health   Financial Resource Strain: Low Risk  (03/20/2020)   Overall Financial Resource Strain (CARDIA)    Difficulty of Paying Living Expenses: Not hard at all  Food Insecurity: No Food Insecurity (03/20/2020)   Hunger Vital Sign    Worried About Running Out of Food in the Last Year: Never  true    Ran Out of Food in the Last Year: Never true  Transportation Needs: No Transportation Needs (03/20/2020)   PRAPARE - Hydrologist (Medical): No    Lack of Transportation (Non-Medical): No  Physical Activity: Inactive (03/20/2020)   Exercise Vital Sign    Days of Exercise per Week: 0 days    Minutes of Exercise per Session: 0 min  Stress: No Stress Concern Present (03/20/2020)   [place]    Feeling of Stress :  Only a little  Social Connections: Socially Integrated (03/20/2020)   Social Connection and Isolation Panel [NHANES]    Frequency of Communication with Friends and Family: Three times a week    Frequency of Social Gatherings with Friends and Family: Three times a week    Attends Religious Services: More than 4 times per year    Active Member of Clubs or Organizations: Yes    Attends Archivist Meetings: 1 to 4 times per year    Marital Status: Married  Human resources officer Violence: Not At Risk (03/20/2020)   Humiliation, Afraid, Rape, and Kick questionnaire    Fear of Current or Ex-Partner: No    Emotionally Abused: No    Physically Abused: No    Sexually Abused: No   Family Status  Relation Name Status   Other  Deceased   Mother  Deceased       enlarged heart    Father  Deceased   Sister  Alive   Brother  Deceased   Brother  Alive   Brother  Alive   Other  (Not Specified)   Other  (Not Specified)   Neg Hx  (Not Specified)   Family History  Problem Relation Age of Onset   Cancer Other        ovarian, breast, colon cancers   Hypertension Mother    Hyperlipidemia Mother    Other Mother        enlarged heart   Heart disease Father 13   Diabetes Father    Diabetes Sister    Hypertension Sister    Kidney disease Sister    Stroke Sister    Heart disease Brother    Hypertension Brother    Diabetes Brother    Healthy Brother    Cancer Other        family hx   Coronary artery disease Other        family hx   Breast cancer Neg Hx    Not on File  Patient Care Team: Birdie Sons, MD as PCP - General (Family Medicine) [place], [place] as Consulting Physician   Medications: Outpatient Medications Prior to Visit  Medication Sig   acetaminophen (TYLENOL) 500 MG tablet Take 2 tablets (1,000 mg total) by mouth every 6 (six) hours as needed for mild pain.   amLODipine (NORVASC) 2.5 MG tablet Take 1 tablet by mouth daily.   aspirin EC 81 MG tablet Take  by mouth.   atorvastatin (LIPITOR) 40 MG tablet Take 1 tablet by mouth daily.   Cholecalciferol (VITAMIN D3 PO) Take 5,000 Units by mouth in the morning.   metoprolol succinate (TOPROL-XL) 25 MG 24 hr tablet Take 1 tablet (25 mg total) by mouth daily.   Multiple Vitamin (MULTIVITAMIN WITH MINERALS) TABS tablet Take 1 tablet by mouth in the morning. Centrum for Women   [DISCONTINUED] acetaminophen (TYLENOL) 500 MG tablet Take 1,000 mg by  mouth every 6 (six) hours as needed (for pain.).   [DISCONTINUED] aspirin EC 81 MG tablet Take 81 mg by mouth every other day. Swallow whole.   [DISCONTINUED] atorvastatin (LIPITOR) 40 MG tablet TAKE 1 TABLET EVERY DAY   No facility-administered medications prior to visit.    Review of Systems  Constitutional:  Negative for appetite change, chills, fatigue and fever.  Respiratory:  Negative for chest tightness and shortness of breath.   Cardiovascular:  Negative for chest pain and palpitations.  Gastrointestinal:  Negative for abdominal pain, nausea and vomiting.  Neurological:  Negative for dizziness and weakness.  Hematological:  Bruises/bleeds easily.    {Labs  Heme  Chem  Endocrine  Serology  Results Review (optional):23779}  Objective    BP 124/88 (BP Location: Right Arm, Patient Position: Sitting, Cuff Size: Normal)   Pulse 74   Resp 16   Ht '5\' 6"'$  (1.676 m)   Wt 179 lb (81.2 kg)   SpO2 98%   BMI 28.89 kg/m  {Show previous vital signs (optional):23777}   Physical Exam   General Appearance:    Well developed, well nourished female. Alert, cooperative, in no acute distress, appears stated age   Head:    Normocephalic, without obvious abnormality, atraumatic  Eyes:    PERRL, conjunctiva/corneas clear, EOM's intact, fundi    benign, both eyes  Ears:    Normal TM's and external ear canals, both ears  Nose:   Nares normal, septum midline, mucosa normal, no drainage    or sinus tenderness  Throat:   Lips, mucosa, and tongue normal; teeth  and gums normal  Neck:   Supple, symmetrical, trachea midline, no adenopathy;    thyroid:  no enlargement/tenderness/nodules; no carotid   bruit or JVD  Back:     Symmetric, no curvature, ROM normal, no CVA tenderness  Lungs:     Clear to auscultation bilaterally, respirations unlabored  Chest Wall:    No tenderness or deformity   Heart:    Normal heart rate. Normal rhythm. No murmurs, rubs, or gallops.   Breast Exam:    deferred  Abdomen:     Soft, non-tender, bowel sounds active all four quadrants,    no masses, no organomegaly  Pelvic:    deferred  Extremities:   All extremities are intact. No cyanosis or edema  Pulses:   2+ and symmetric all extremities  Skin:   Skin color, texture, turgor normal, no rashes or lesions  Lymph nodes:   Cervical, supraclavicular, and axillary nodes normal  Neurologic:   CNII-XII intact, normal strength, sensation and reflexes    throughout      Assessment & Plan    1. Annual physical exam Generally doing well. UTD HM. Unremarkable exam.   2. Hypercholesteremia She is tolerating atorvastatin well with no adverse effects.   - Lipid panel  3. Primary hypertension Near goal. Continue current medications.   - Renal function panel - Magnesium  4. Mild carotid artery disease (HCC) Asymptomatic. Compliant with medication.  Continue aggressive risk factor modification.    5. Need for influenza vaccination  - Flu Vaccine QUAD High Dose IM (Fluad)     The entirety of the information documented in the History of Present Illness, Review of Systems and Physical Exam were personally obtained by me. Portions of this information were initially documented by the CMA and reviewed by me for thoroughness and accuracy.     Lelon Huh, MD  [facility] [phone] (phone) [phone] (fax)   Medical Group  

## 2022-07-07 NOTE — Progress Notes (Unsigned)
Annual Wellness Visit     Patient: [name] [name], Female    DOB: 09-Jun-1946, 76 y.o.   MRN: [id number] Visit Date: 07/07/2022  Today's Provider: Lelon Huh, MD    Subjective    [name] [name] is a 76 y.o. female who presents today for her Annual Wellness Visit.   Medications: Outpatient Medications Prior to Visit  Medication Sig   acetaminophen (TYLENOL) 500 MG tablet Take 2 tablets (1,000 mg total) by mouth every 6 (six) hours as needed for mild pain.   amLODipine (NORVASC) 2.5 MG tablet Take 1 tablet by mouth daily.   aspirin EC 81 MG tablet Take by mouth.   atorvastatin (LIPITOR) 40 MG tablet Take 1 tablet by mouth daily.   Cholecalciferol (VITAMIN D3 PO) Take 5,000 Units by mouth in the morning.   metoprolol succinate (TOPROL-XL) 25 MG 24 hr tablet Take 1 tablet (25 mg total) by mouth daily.   Multiple Vitamin (MULTIVITAMIN WITH MINERALS) TABS tablet Take 1 tablet by mouth in the morning. Centrum for Women   [DISCONTINUED] acetaminophen (TYLENOL) 500 MG tablet Take 1,000 mg by mouth every 6 (six) hours as needed (for pain.).   No facility-administered medications prior to visit.    Not on File  Patient Care Team: Birdie Sons, MD as PCP - General (Family Medicine) Pa, [place] as Consulting Physician Bary Castilla, Forest Gleason, MD as Consulting Physician (General Surgery)    Objective     Most recent functional status assessment:    07/07/2022    9:37 AM  In your present state of health, do you have any difficulty performing the following activities:  Hearing? 0  Vision? 0  Difficulty concentrating or making decisions? 0  Walking or climbing stairs? 0  Dressing or bathing? 0  Doing errands, shopping? 0   Most recent fall risk assessment:    07/07/2022    9:37 AM  Fall Risk   Falls in the past year? 0  Number falls in past yr: 0  Injury with Fall? 0  Risk for fall due to : No Fall Risks  Follow up Falls evaluation completed    Most recent  depression screenings:    07/07/2022    9:37 AM 06/28/2021    9:17 AM  PHQ 2/9 Scores  PHQ - 2 Score 0 0  PHQ- 9 Score 3 1   Most recent cognitive screening:    06/28/2021    9:14 AM  6CIT Screen  What Year? 0 points  What month? 0 points  What time? 0 points  Count back from 20 0 points  Months in reverse 0 points  Repeat phrase 6 points  Total Score 6 points   Most recent Audit-C alcohol use screening    07/07/2022    9:38 AM  Alcohol Use Disorder Test (AUDIT)  1. How often do you have a drink containing alcohol? 0  2. How many drinks containing alcohol do you have on a typical day when you are drinking? 0  3. How often do you have six or more drinks on one occasion? 0  AUDIT-C Score 0   A score of 3 or more in women, and 4 or more in men indicates increased risk for alcohol abuse, EXCEPT if all of the points are from question 1   No results found for any visits on 07/07/22.  Assessment & Plan     Annual wellness visit done today including the all of the following:  Reviewed patient's Family Medical History Reviewed and updated list of patient's medical providers Assessment of cognitive impairment was done Assessed patient's functional ability Established a written schedule for health screening [place] Completed and Reviewed  Exercise Activities and Dietary recommendations  Goals      Exercise 3x per week (30 min per time)     Recommend to start exercising 3 days a week for at least 30 minutes at at time.          Immunization History  Administered Date(s) Administered   Fluad Quad(high Dose 65+) 06/26/2020, 06/28/2021   Influenza, High Dose Seasonal PF 08/05/2014, 07/15/2017, 07/31/2018, 07/13/2019   PFIZER(Purple Top)SARS-COV-2 Vaccination 11/10/2019, 12/01/2019   Pneumococcal Conjugate-13 12/23/2013   Pneumococcal Polysaccharide-23 12/16/2010   Td 10/06/2007    Health Maintenance  Topic Date Due   Zoster Vaccines- Shingrix (1  of 2) Never done   TETANUS/TDAP  10/05/2017   COVID-19 Vaccine (3 - Pfizer series) 01/26/2020   INFLUENZA VACCINE  04/29/2022   DEXA SCAN  03/27/2023   COLONOSCOPY (Pts 45-85yr Insurance coverage will need to be confirmed)  06/05/2027   Pneumonia Vaccine 76 Years old  Completed   Hepatitis C Screening  Completed   HPV VACCINES  Aged Out     Discussed health benefits of physical activity, and encouraged her to engage in regular exercise appropriate for her age and condition.            DLelon Huh MD  [phone](phone) [phone](fax)  [facility]

## 2022-07-08 LAB — RENAL FUNCTION PANEL
Albumin: 4.3 g/dL (ref 3.8–4.8)
BUN/Creatinine Ratio: 16 (ref 12–28)
BUN: 11 mg/dL (ref 8–27)
CO2: 21 mmol/L (ref 20–29)
Calcium: 9.3 mg/dL (ref 8.7–10.3)
Chloride: 107 mmol/L — ABNORMAL HIGH (ref 96–106)
Creatinine, Ser: 0.67 mg/dL (ref 0.57–1.00)
Glucose: 93 mg/dL (ref 70–99)
Phosphorus: 3.6 mg/dL (ref 3.0–4.3)
Potassium: 4.8 mmol/L (ref 3.5–5.2)
Sodium: 145 mmol/L — ABNORMAL HIGH (ref 134–144)
eGFR: 90 mL/min/{1.73_m2} (ref >59)

## 2022-07-08 LAB — MAGNESIUM: Magnesium: 2.1 mg/dL (ref 1.6–2.3)

## 2022-07-08 LAB — LIPID PANEL
Chol/HDL Ratio: 2.7 ratio (ref 0.0–4.4)
Cholesterol, Total: 197 mg/dL (ref 100–199)
HDL: 72 mg/dL (ref >39)
LDL Chol Calc (NIH): 109 mg/dL — ABNORMAL HIGH (ref 0–99)
Triglycerides: 91 mg/dL (ref 0–149)
VLDL Cholesterol Cal: 16 mg/dL (ref 5–40)

## 2022-07-30 ENCOUNTER — Encounter
Admission: RE | Disposition: A | Discharge status: Home / Self Care | Payer: Self-pay | Source: Home / Self Care | Attending: General Surgery

## 2022-07-30 ENCOUNTER — Ambulatory Visit
Admission: RE | Admit: 2022-07-30 | Discharge: 2022-07-30 | Disposition: A | Discharge status: Home / Self Care | Payer: Medicare HMO | Attending: General Surgery | Admitting: General Surgery

## 2022-07-30 ENCOUNTER — Ambulatory Visit: Payer: Medicare HMO | Admitting: Anesthesiology

## 2022-07-30 DIAGNOSIS — K219 Gastro-esophageal reflux disease without esophagitis: Secondary | ICD-10-CM | POA: Insufficient documentation

## 2022-07-30 DIAGNOSIS — I10 Essential (primary) hypertension: Secondary | ICD-10-CM | POA: Diagnosis not present

## 2022-07-30 DIAGNOSIS — E785 Hyperlipidemia, unspecified: Secondary | ICD-10-CM | POA: Diagnosis not present

## 2022-07-30 DIAGNOSIS — K2970 Gastritis, unspecified, without bleeding: Secondary | ICD-10-CM | POA: Diagnosis not present

## 2022-07-30 DIAGNOSIS — Z09 Encounter for follow-up examination after completed treatment for conditions other than malignant neoplasm: Secondary | ICD-10-CM | POA: Insufficient documentation

## 2022-07-30 DIAGNOSIS — K2960 Other gastritis without bleeding: Secondary | ICD-10-CM | POA: Diagnosis not present

## 2022-07-30 DIAGNOSIS — K31A14 Gastric intestinal metaplasia without dysplasia, involving the cardia: Secondary | ICD-10-CM | POA: Insufficient documentation

## 2022-07-30 DIAGNOSIS — K31A22 Gastric intestinal metaplasia with high grade dysplasia: Secondary | ICD-10-CM | POA: Diagnosis not present

## 2022-07-30 HISTORY — PX: ESOPHAGOGASTRODUODENOSCOPY (EGD) WITH PROPOFOL: SHX5813

## 2022-07-30 SURGERY — ESOPHAGOGASTRODUODENOSCOPY (EGD) WITH PROPOFOL
Anesthesia: General

## 2022-07-30 MED ORDER — PROPOFOL 1000 MG/100ML IV EMUL
INTRAVENOUS | Status: AC
  Filled 2022-07-30: qty 100

## 2022-07-30 MED ORDER — SODIUM CHLORIDE 0.9 % IV SOLN
INTRAVENOUS | Status: DC
  Administered 2022-07-30: 20 mL/h via INTRAVENOUS

## 2022-07-30 MED ORDER — PROPOFOL 10 MG/ML IV BOLUS
INTRAVENOUS | Status: DC | PRN
  Administered 2022-07-30: 20 mg via INTRAVENOUS
  Administered 2022-07-30: 80 mg via INTRAVENOUS

## 2022-07-30 MED ORDER — LIDOCAINE HCL (CARDIAC) PF 100 MG/5ML IV SOSY
PREFILLED_SYRINGE | INTRAVENOUS | Status: DC | PRN
  Administered 2022-07-30: 100 mg via INTRAVENOUS

## 2022-07-30 MED ORDER — LIDOCAINE HCL (PF) 2 % IJ SOLN
INTRAMUSCULAR | Status: AC
  Filled 2022-07-30: qty 15

## 2022-07-30 MED ORDER — PROPOFOL 500 MG/50ML IV EMUL
INTRAVENOUS | Status: DC | PRN
  Administered 2022-07-30: 175 ug/kg/min via INTRAVENOUS

## 2022-07-30 NOTE — H&P (Signed)
[name] [name] [id number] September 30, 1945     HPI: Prior EGD showing a large polyp and h pylori infection. For repeat EGD.   Medications Prior to Admission  Medication Sig Dispense Refill Last Dose   acetaminophen (TYLENOL) 500 MG tablet Take 2 tablets (1,000 mg total) by mouth every 6 (six) hours as needed for mild pain.   07/29/2022   amLODipine (NORVASC) 2.5 MG tablet Take 1 tablet by mouth daily.   07/29/2022   aspirin EC 81 MG tablet Take by mouth.   Past Week   atorvastatin (LIPITOR) 40 MG tablet Take 1 tablet by mouth daily.   07/29/2022   Cholecalciferol (VITAMIN D3 PO) Take 5,000 Units by mouth in the morning.   07/29/2022   metoprolol succinate (TOPROL-XL) 25 MG 24 hr tablet Take 1 tablet (25 mg total) by mouth daily. 90 tablet 2 07/29/2022   Multiple Vitamin (MULTIVITAMIN WITH MINERALS) TABS tablet Take 1 tablet by mouth in the morning. Centrum for Women   07/29/2022   No Known Allergies Past Medical History:  Diagnosis Date   Chest pain, unspecified    Colitis 02/20/2016   H/O left breast biopsy 12/11/2005   [facility], path report matched the clinical impression of left breast lipoma   HLD (hyperlipidemia)    mixed   HTN (hypertension)    Kidney stones 03/2022   Past Surgical History:  Procedure Laterality Date   ABDOMINAL HYSTERECTOMY  2007   total   BREAST BIOPSY Left 2007   Dr. Dwyane [name] office-benign   COLONOSCOPY  11/12/2001   multiple sessile polyps found in rectum, 78m in size, distributed diffusely, biopsies were taken. The polyps extended from lower rectum to approximately 20 cm. They were confluent in the area between 10-15cm. Small polyps with central ulceration were noted.   COLONOSCOPY WITH PROPOFOL N/A 08/04/2018   Procedure: COLONOSCOPY WITH PROPOFOL;  Surgeon: BRobert Bellow MD;  Location: [facility] ENDOSCOPY;  Service: Endoscopy;  Laterality: N/A;   COLONOSCOPY WITH PROPOFOL N/A 06/04/2022   Procedure: COLONOSCOPY WITH PROPOFOL;  Surgeon: BRobert Bellow MD;   Location: [facility] ENDOSCOPY;  Service: Endoscopy;  Laterality: N/A;   ESOPHAGOGASTRODUODENOSCOPY (EGD) WITH PROPOFOL N/A 06/04/2022   Procedure: ESOPHAGOGASTRODUODENOSCOPY (EGD) WITH PROPOFOL;  Surgeon: BRobert Bellow MD;  Location: [facility] ENDOSCOPY;  Service: Endoscopy;  Laterality: N/A;  with polypectomy   TUBAL LIGATION     UMBILICAL HERNIA REPAIR N/A 10/01/2021   Procedure: HERNIA REPAIR UMBILICAL ADULT, open;  Surgeon: [name] Ree MD;  Location: [facility] ORS;  Service: General;  Laterality: N/A;   Social History   Socioeconomic History   Marital status: Married    Spouse name: [name] [name]   Number of children: 5   Years of education: HS   Highest education level: 12th grade  Occupational History   Occupation: retired  Tobacco Use   Smoking status: Never   Smokeless tobacco: Never   Tobacco comments:    tobacco use - no  Vaping Use   Vaping Use: Never used  Substance and Sexual Activity   Alcohol use: No   Drug use: No   Sexual activity: Not on file  Other Topics Concern   Not on file  Social History Narrative   Married, full time, exercise - walks some.   Social Determinants of Health   Financial Resource Strain: Low Risk  (03/20/2020)   Overall Financial Resource Strain (CARDIA)    Difficulty of Paying Living Expenses: Not hard at all  Food Insecurity: No Food Insecurity (03/20/2020)  Hunger Vital Sign    Worried About Running Out of Food in the Last Year: Never true    Ran Out of Food in the Last Year: Never true  Transportation Needs: No Transportation Needs (03/20/2020)   PRAPARE - Hydrologist (Medical): No    Lack of Transportation (Non-Medical): No  Physical Activity: Inactive (03/20/2020)   Exercise Vital Sign    Days of Exercise per Week: 0 days    Minutes of Exercise per Session: 0 min  Stress: No Stress Concern Present (03/20/2020)   [place]    Feeling of Stress :  Only a little  Social Connections: Socially Integrated (03/20/2020)   Social Connection and Isolation Panel [NHANES]    Frequency of Communication with Friends and Family: Three times a week    Frequency of Social Gatherings with Friends and Family: Three times a week    Attends Religious Services: More than 4 times per year    Active Member of Clubs or Organizations: Yes    Attends Archivist Meetings: 1 to 4 times per year    Marital Status: Married  Human resources officer Violence: Not At Risk (03/20/2020)   Humiliation, Afraid, Rape, and Kick questionnaire    Fear of Current or Ex-Partner: No    Emotionally Abused: No    Physically Abused: No    Sexually Abused: No   Social History   Social History Narrative   Married, full time, exercise - walks some.     ROS: Negative.   DIAGNOSIS:  A. STOMACH POLYP; HOT SNARE:  - INTESTINAL-TYPE GASTRIC ADENOMA, HIGH-GRADE, SEE COMMENT.  - BACKGROUND MODERATE CHRONIC ACTIVE GASTRITIS WITH FOCAL INTESTINAL  METAPLASIA; IHC FOR H. PYLORI WILL BE REPORTED IN AN ADDENDUM.  - NEGATIVE FOR MALIGNANCY.   B. COLON POLYP X 2, CECUM; HOT AND COLD SNARE:  - TUBULAR ADENOMA (MULTIPLE FRAGMENTS).  - NEGATIVE FOR HIGH-GRADE DYSPLASIA AND MALIGNANCY.   An IHC stain for H. pylori highlights [name] [name] organisms. These  findings are consistent with [name] [name]-associated gastritis.  PE: HEENT: Negative. Lungs: Clear. Cardio: RR.   Assessment/Plan:  Proceed with planned upper endoscopy.    [name] [name] 07/30/2022

## 2022-07-30 NOTE — Anesthesia Postprocedure Evaluation (Signed)
Anesthesia Post Note  Patient: [name] [name]  Procedure(s) Performed: ESOPHAGOGASTRODUODENOSCOPY (EGD) WITH PROPOFOL  Patient location during evaluation: PACU Anesthesia Type: General Level of consciousness: awake and alert Pain management: pain level controlled Vital Signs Assessment: post-procedure vital signs reviewed and stable Respiratory status: spontaneous breathing, nonlabored ventilation, respiratory function stable and patient connected to nasal cannula oxygen Cardiovascular status: blood pressure returned to baseline and stable Postop Assessment: no apparent nausea or vomiting Anesthetic complications: no   No notable events documented.   Last Vitals:  Vitals:   07/30/22 0900 07/30/22 0918  BP: 100/77 (!) 120/90  Pulse:    Resp:    Temp:    SpO2:      Last Pain:  Vitals:   07/30/22 0918  TempSrc:   PainSc: 0-No pain                 Precious Haws Austen Oyster

## 2022-07-30 NOTE — Anesthesia Preprocedure Evaluation (Signed)
Anesthesia Evaluation  Patient identified by MRN, date of birth, ID band Patient awake    Reviewed: Allergy & Precautions, NPO status , Patient's Chart, lab work & pertinent test results  History of Anesthesia Complications Negative for: history of anesthetic complications  Airway Mallampati: III  TM Distance: <3 FB Neck ROM: full    Dental  (+) Chipped, Poor Dentition, Missing   Pulmonary neg pulmonary ROS, neg shortness of breath,    Pulmonary exam normal        Cardiovascular Exercise Tolerance: Good hypertension, (-) angina(-) Past MI Normal cardiovascular exam     Neuro/Psych negative neurological ROS  negative psych ROS   GI/Hepatic Neg liver ROS, GERD  Controlled,  Endo/Other  negative endocrine ROS  Renal/GU Renal disease  negative genitourinary   Musculoskeletal   Abdominal   Peds  Hematology negative hematology ROS (+)   Anesthesia Other Findings Past Medical History: No date: Chest pain, unspecified 02/20/2016: Colitis 12/11/2005: H/O left breast biopsy     Comment:  [facility], path report matched the clinical impression of               left breast lipoma No date: HLD (hyperlipidemia)     Comment:  mixed No date: HTN (hypertension) 03/2022: Kidney stones  Past Surgical History: 2007: ABDOMINAL HYSTERECTOMY     Comment:  total 2007: BREAST BIOPSY; Left     Comment:  Dr. Dwyane Luo office-benign 11/12/2001: COLONOSCOPY     Comment:  multiple sessile polyps found in rectum, 87m in size,               distributed diffusely, biopsies were taken. The polyps               extended from lower rectum to approximately 20 cm. They               were confluent in the area between 10-15cm. Small polyps               with central ulceration were noted. 08/04/2018: COLONOSCOPY WITH PROPOFOL; N/A     Comment:  Procedure: COLONOSCOPY WITH PROPOFOL;  Surgeon: BRobert Bellow MD;  Location: [facility]  ENDOSCOPY;  Service:               Endoscopy;  Laterality: N/A; 06/04/2022: COLONOSCOPY WITH PROPOFOL; N/A     Comment:  Procedure: COLONOSCOPY WITH PROPOFOL;  Surgeon: BRobert Bellow MD;  Location: [facility] ENDOSCOPY;  Service:               Endoscopy;  Laterality: N/A; 06/04/2022: ESOPHAGOGASTRODUODENOSCOPY (EGD) WITH PROPOFOL; N/A     Comment:  Procedure: ESOPHAGOGASTRODUODENOSCOPY (EGD) WITH               PROPOFOL;  Surgeon: BRobert Bellow MD;  Location:               [facility] ENDOSCOPY;  Service: Endoscopy;  Laterality: N/A;                with polypectomy No date: TUBAL LIGATION 09/04/622 UMBILICAL HERNIA REPAIR; N/A     Comment:  Procedure: HERNIA REPAIR UMBILICAL ADULT, open;                Surgeon: POlean Ree MD;  Location: [facility] ORS;  Service: General;  Laterality: N/A;  BMI    Body Mass Index: 28.25 kg/m      Reproductive/Obstetrics negative OB ROS                             Anesthesia Physical Anesthesia Plan  ASA: 3  Anesthesia Plan: General   Post-op Pain Management:    Induction: Intravenous  PONV Risk Score and Plan: Propofol infusion and TIVA  Airway Management Planned: Natural Airway and Nasal Cannula  Additional Equipment:   Intra-op Plan:   Post-operative Plan:   Informed Consent: I have reviewed the patients History and Physical, chart, labs and discussed the procedure including the risks, benefits and alternatives for the proposed anesthesia with the patient or authorized representative who has indicated his/her understanding and acceptance.     Dental Advisory Given  Plan Discussed with: Anesthesiologist, CRNA and Surgeon  Anesthesia Plan Comments: (Patient consented for risks of anesthesia including but not limited to:  - adverse reactions to medications - risk of airway placement if required - damage to eyes, teeth, lips or other oral mucosa - nerve damage due to positioning  -  sore throat or hoarseness - Damage to heart, brain, nerves, lungs, other parts of body or loss of life  Patient voiced understanding.)        Anesthesia Quick Evaluation

## 2022-07-30 NOTE — Transfer of Care (Signed)
Immediate Anesthesia Transfer of Care Note  Patient: [name] [name]  Procedure(s) Performed: ESOPHAGOGASTRODUODENOSCOPY (EGD) WITH PROPOFOL  Patient Location: Endoscopy Unit  Anesthesia Type:General  Level of Consciousness: drowsy  Airway & Oxygen Therapy: Patient Spontanous Breathing  Post-op Assessment: Report given to RN and Post -op Vital signs reviewed and stable  Post vital signs: Reviewed and stable  Last Vitals:  Vitals Value Taken Time  BP    Temp    Pulse 78 07/30/22 0852  Resp 27 07/30/22 0852  SpO2 98 % 07/30/22 0852  Vitals shown include unvalidated device data.  Last Pain:  Vitals:   07/30/22 0750  TempSrc: Temporal  PainSc: 0-No pain         Complications: No notable events documented.

## 2022-07-30 NOTE — Op Note (Signed)
[facility] Gastroenterology Patient Name: [name] [name] Procedure Date: 07/30/2022 8:36 AM MRN: [id number] Account #: [id number] Date of Birth: [birth year] Admit Type: Outpatient Age: 76 Room: [facility] ENDO ROOM 1 Gender: Female Note Status: Finalized Instrument Name: Upper Endoscope [id number] Procedure:             Upper GI endoscopy Indications:           Follow-up of Helicobacter pylori Providers:             Robert Bellow, MD Medicines:             Propofol per Anesthesia Complications:         No immediate complications. Procedure:             Pre-Anesthesia Assessment:                        - Prior to the procedure, a History and Physical was                         performed, and patient medications, allergies and                         sensitivities were reviewed. The patient's tolerance                         of previous anesthesia was reviewed.                        - The risks and benefits of the procedure and the                         sedation options and risks were discussed with the                         patient. All questions were answered and informed                         consent was obtained.                        After obtaining informed consent, the endoscope was                         passed under direct vision. Throughout the procedure,                         the patient's blood pressure, pulse, and oxygen                         saturations were monitored continuously. The Endoscope                         was introduced through the mouth, and advanced to the                         second part of duodenum. The patient tolerated the                         procedure well. The upper GI endoscopy was  accomplished without difficulty. Findings:      The esophagus was normal.      The examined duodenum was normal.      Patchy mild inflammation characterized by erythema and friability was       found in the  prepyloric region of the stomach. Biopsies were taken with       a cold forceps for histology.      Diffuse moderate inflammation characterized by erythema and nodularity       was found in the cardia. Biopsies were taken with a cold forceps for       histology. Impression:            - Normal esophagus.                        - Normal examined duodenum.                        - Chronic gastritis. Biopsied.                        - Chronic gastritis. Biopsied. Recommendation:        - Telephone endoscopist for pathology results in 1                         week. Procedure Code(s):     --- Professional ---                        [phone], Esophagogastroduodenoscopy, flexible,                         transoral; with biopsy, single or multiple Diagnosis Code(s):     --- Professional ---                        K29.50, Unspecified chronic gastritis without bleeding                        I96.78, Helicobacter pylori [H. pylori] as the cause                         of diseases classified elsewhere CPT copyright 2022 American Medical Association. All rights reserved. The codes documented in this report are preliminary and upon coder review may  be revised to meet current compliance requirements. Robert Bellow, MD 07/30/2022 8:50:18 AM This report has been signed electronically. Number of Addenda: 0 Note Initiated On: 07/30/2022 8:36 AM Estimated Blood Loss:  Estimated blood loss was minimal.      [facility]

## 2022-07-31 ENCOUNTER — Encounter: Payer: Self-pay | Admitting: General Surgery

## 2022-08-01 LAB — SURGICAL PATHOLOGY

## 2022-09-02 DIAGNOSIS — B9681 Helicobacter pylori [H. pylori] as the cause of diseases classified elsewhere: Secondary | ICD-10-CM | POA: Diagnosis not present

## 2022-09-02 DIAGNOSIS — K2970 Gastritis, unspecified, without bleeding: Secondary | ICD-10-CM | POA: Diagnosis not present

## 2022-09-03 ENCOUNTER — Encounter: Payer: Self-pay | Admitting: Family Medicine

## 2022-09-12 ENCOUNTER — Other Ambulatory Visit: Payer: Self-pay | Admitting: Family Medicine

## 2022-09-12 NOTE — Telephone Encounter (Signed)
Requested medications are due for refill today.  Unsure  Requested medications are on the active medications list.  yes  Last refill. 03/2022  Future visit scheduled.   yes  Notes to clinic.  Medication is listed as historical.    Requested Prescriptions  Pending Prescriptions Disp Refills   amLODipine (NORVASC) 2.5 MG tablet [Pharmacy Med Name: AMLODIPINE BESYLATE 2.5 MG Tablet] 90 tablet 3    Sig: TAKE 1 TABLET EVERY DAY     Cardiovascular: Calcium Channel Blockers 2 Failed - 09/12/2022  5:01 PM      Failed - Last BP in normal range    BP Readings from Last 1 Encounters:  07/30/22 (!) 120/90         Passed - Last Heart Rate in normal range    Pulse Readings from Last 1 Encounters:  07/30/22 74         Passed - Valid encounter within last 6 months    Recent Outpatient Visits           2 months ago Annual physical exam   [facility] Birdie Sons, MD   1 year ago Annual physical exam   [facility] Birdie Sons, MD   1 year ago Mass of subcutaneous tissue   [facility] Birdie Sons, MD   2 years ago Annual physical exam   [facility] Birdie Sons, MD   3 years ago Rif[place], Donald E, MD

## 2022-09-24 DIAGNOSIS — K2950 Unspecified chronic gastritis without bleeding: Secondary | ICD-10-CM | POA: Diagnosis not present

## 2022-10-08 DIAGNOSIS — K31A22 Gastric intestinal metaplasia with high grade dysplasia: Secondary | ICD-10-CM | POA: Diagnosis not present

## 2022-10-10 ENCOUNTER — Ambulatory Visit (INDEPENDENT_AMBULATORY_CARE_PROVIDER_SITE_OTHER): Payer: Medicare HMO | Admitting: Family Medicine

## 2022-10-10 ENCOUNTER — Encounter: Payer: Self-pay | Admitting: Family Medicine

## 2022-10-10 VITALS — BP 128/72 | HR 76 | Ht 66.5 in | Wt 184.7 lb

## 2022-10-10 DIAGNOSIS — L304 Erythema intertrigo: Secondary | ICD-10-CM | POA: Diagnosis not present

## 2022-10-10 DIAGNOSIS — N814 Uterovaginal prolapse, unspecified: Secondary | ICD-10-CM

## 2022-10-10 MED ORDER — NYSTATIN 100000 UNIT/GM EX OINT: 1.0000 | TOPICAL_OINTMENT | Freq: Two times a day (BID) | CUTANEOUS | 1 refills | Status: AC

## 2022-10-10 NOTE — Patient Instructions (Addendum)
Please review the attached list of medications and notify my office if there are any errors.   Call if not much better in 10 days

## 2022-10-10 NOTE — Progress Notes (Signed)
I,Sha'taria Tyson,acting as a Education administrator for Lelon Huh, MD.,have documented all relevant documentation on the behalf of Lelon Huh, MD,as directed by  Lelon Huh, MD while in the presence of Lelon Huh, MD.   Established patient visit   Patient: [name] [name]   DOB: 19-Jul-1946   77 y.o. Female  MRN: [id number] Visit Date: 10/10/2022  Today's healthcare provider: Lelon Huh, MD    Subjective    Rash This is a new problem. The current episode started 1 to 4 weeks ago. The affected locations include the chest (under left breast). The rash is characterized by redness, itchiness and scaling. She was exposed to nothing. Pertinent negatives include no fatigue, fever, shortness of breath or vomiting. Treatments tried: peroxide, vaseline, neosporin. The treatment provided mild relief.    Medications during endocscopy and it was one I particular that made her rectum itch and tasted terrible. Reports she took it for 15 days 4 times a day. This was during the 2nd endoscopy  She also reports that her bladder is falling and bulging out and is wondering if she needs to have urologist fix it. She denies having any pain or urinary incontinence.   Medications: Outpatient Medications Prior to Visit  Medication Sig   acetaminophen (TYLENOL) 500 MG tablet Take 2 tablets (1,000 mg total) by mouth every 6 (six) hours as needed for mild pain.   amLODipine (NORVASC) 2.5 MG tablet TAKE 1 TABLET EVERY DAY   aspirin EC 81 MG tablet Take by mouth.   atorvastatin (LIPITOR) 40 MG tablet Take 1 tablet by mouth daily.   Cholecalciferol (VITAMIN D3 PO) Take 5,000 Units by mouth in the morning.   metoprolol succinate (TOPROL-XL) 25 MG 24 hr tablet Take 1 tablet (25 mg total) by mouth daily.   Multiple Vitamin (MULTIVITAMIN WITH MINERALS) TABS tablet Take 1 tablet by mouth in the morning. Centrum for Women   No facility-administered medications prior to visit.    Review of Systems  Constitutional:   Negative for appetite change, chills, fatigue and fever.  Respiratory:  Negative for chest tightness and shortness of breath.   Cardiovascular:  Negative for chest pain and palpitations.  Gastrointestinal:  Negative for abdominal pain, nausea and vomiting.  Skin:  Positive for rash.  Neurological:  Negative for dizziness and weakness.       Objective    BP 128/72   Pulse 76   Ht 5' 6.5" (1.689 m)   Wt 184 lb 11.2 oz (83.8 kg)   SpO2 96%   BMI 29.36 kg/m    Physical Exam   Erythematous rash folds of left breast c/w intertrigo.   Assessment & Plan     1. Intertrigo  - nystatin ointment (MYCOSTATIN); Apply 1 Application topically 2 (two) times daily.  Dispense: 30 g; Refill: 1  Call if symptoms change or if not rapidly improving.   Consider adding steroid cream if not resolved in 7-10 days.   2. Cystocele with prolapse Advised that this could be surgically corrected, but it not currently causing any urinary problems. Advised to call for referral to urology if it becomes bothersome.       The entirety of the information documented in the History of Present Illness, Review of Systems and Physical Exam were personally obtained by me. Portions of this information were initially documented by the CMA and reviewed by me for thoroughness and accuracy.     Lelon Huh, MD  [facility] [phone] (phone) [phone] (fax)  [facility]  

## 2023-03-20 ENCOUNTER — Ambulatory Visit: Payer: Medicare HMO | Admitting: Family Medicine

## 2023-03-25 NOTE — Progress Notes (Unsigned)
Name: [name] [name]   MRN: [id number]    DOB: Oct 19, 1945   Date:03/26/2023       Progress Note  Subjective  Chief Complaint  Establish Care  HPI  Incomplete Void and possible vaginal prolapse: she is s/p hysterectomy. She is new to me and based on Dr. Theodis Aguas note she has vaginal prolapse. She has noticed incomplete void and occasional urgency. She also has a history of kidney stones and at one point hydronephrosis but never seen Urologist. She would like a referral  Neutropenia: over the years, asymptomatic, likely familial but we will recheck labs  Mild carotid artery disease: she is on Atorvastatin 40 mg , she is compliant and denies side effects. Explained to her goal LDL is below 70 and she agrees on changing to Rosuvastatin 40 mg if LDL still above goal   Osteopenia after menopause: last vitamin D was at goal, she is physically active, we will recheck bone density  HTN and PVC: controlled with current medications of norvasc and metoprolol, no side effects. No chest pain or palpitation   Paresthesia: of both hands ( 1st through 4 th fingers) intermittent and mild at this time, not sure what trigger symptoms but starting to bother her. We will check B12 and Folate, explain to her it may be carpal tunnel but if mild we can monitor for now  Toe pain: over the past three weeks she has noticed a sore spot on the end of her 3rd right toe. Pain when applying pressure. No trauma.   Patient Active Problem List   Diagnosis Date Noted   Calculus of gallbladder without cholecystitis without obstruction 03/26/2023   Other neutropenia (HCC) 03/26/2023   History of Helicobacter pylori infection 03/26/2023   Non-recurrent bilateral inguinal hernia without obstruction or gangrene    Mild carotid artery disease (HCC) 06/02/2019   History of adenomatous polyp of colon 06/30/2018   Osteopenia after menopause 02/20/2017   Acid reflux 02/20/2016   Hypercholesteremia 02/20/2016   Primary  hypertension 02/20/2016   Irregular cardiac rhythm 02/20/2016   Calculus of kidney 02/20/2016   Atrophy of thyroid 02/20/2016   Neck pain on left side 02/06/2014   Asymptomatic PVCs 02/06/2014    Past Surgical History:  Procedure Laterality Date   ABDOMINAL HYSTERECTOMY  2007   total   BREAST BIOPSY Left 2007   Dr. Rutherford Nail office-benign   COLONOSCOPY  11/12/2001   multiple sessile polyps found in rectum, 5mm in size, distributed diffusely, biopsies were taken. The polyps extended from lower rectum to approximately 20 cm. They were confluent in the area between 10-15cm. Small polyps with central ulceration were noted.   COLONOSCOPY WITH PROPOFOL N/A 08/04/2018   Procedure: COLONOSCOPY WITH PROPOFOL;  Surgeon: Earline Mayotte, MD;  Location: [facility];  Service: Endoscopy;  Laterality: N/A;   COLONOSCOPY WITH PROPOFOL N/A 06/04/2022   Procedure: COLONOSCOPY WITH PROPOFOL;  Surgeon: Earline Mayotte, MD;  Location: [facility];  Service: Endoscopy;  Laterality: N/A;   ESOPHAGOGASTRODUODENOSCOPY (EGD) WITH PROPOFOL N/A 06/04/2022   Procedure: ESOPHAGOGASTRODUODENOSCOPY (EGD) WITH PROPOFOL;  Surgeon: Earline Mayotte, MD;  Location: [facility];  Service: Endoscopy;  Laterality: N/A;  with polypectomy   ESOPHAGOGASTRODUODENOSCOPY (EGD) WITH PROPOFOL N/A 07/30/2022   Procedure: ESOPHAGOGASTRODUODENOSCOPY (EGD) WITH PROPOFOL;  Surgeon: Earline Mayotte, MD;  Location: [facility];  Service: Endoscopy;  Laterality: N/A;   TUBAL LIGATION     UMBILICAL HERNIA REPAIR N/A 10/01/2021   Procedure: HERNIA REPAIR UMBILICAL ADULT, open;  Surgeon: Aleen Campi,  Elita Quick, MD;  Location: [facility];  Service: General;  Laterality: N/A;    Family History  Problem Relation Age of Onset   Cancer Other        ovarian, breast, colon cancers   Hypertension Mother    Hyperlipidemia Mother    Other Mother        enlarged heart   Heart disease Father 30   Diabetes Father    Diabetes Sister     Hypertension Sister    Kidney disease Sister    Stroke Sister    Heart disease Brother    Hypertension Brother    Diabetes Brother    Healthy Brother    Cancer Other        family hx   Coronary artery disease Other        family hx   Breast cancer Neg Hx     Social History   Tobacco Use   Smoking status: Never   Smokeless tobacco: Never   Tobacco comments:    tobacco use - no  Substance Use Topics   Alcohol use: No     Current Outpatient Medications:    acetaminophen (TYLENOL) 500 MG tablet, Take 2 tablets (1,000 mg total) by mouth every 6 (six) hours as needed for mild pain., Disp: , Rfl:    amLODipine (NORVASC) 2.5 MG tablet, TAKE 1 TABLET EVERY DAY, Disp: 90 tablet, Rfl: 3   aspirin EC 81 MG tablet, Take by mouth., Disp: , Rfl:    atorvastatin (LIPITOR) 40 MG tablet, Take 1 tablet by mouth daily., Disp: , Rfl:    Cholecalciferol (VITAMIN D3 PO), Take 5,000 Units by mouth in the morning., Disp: , Rfl:    metoprolol succinate (TOPROL-XL) 25 MG 24 hr tablet, Take 1 tablet (25 mg total) by mouth daily., Disp: 90 tablet, Rfl: 2   Multiple Vitamin (MULTIVITAMIN WITH MINERALS) TABS tablet, Take 1 tablet by mouth in the morning. Centrum for Women, Disp: , Rfl:    nystatin ointment (MYCOSTATIN), Apply 1 Application topically 2 (two) times daily., Disp: 30 g, Rfl: 1   zinc gluconate 50 MG tablet, Take 50 mg by mouth daily., Disp: , Rfl:   No Known Allergies  I personally reviewed active problem list, medication list, allergies, family history, social history, health maintenance with the patient/caregiver today.   ROS  Constitutional: Negative for fever or weight change.  Respiratory: Negative for cough and shortness of breath.   Cardiovascular: Negative for chest pain or palpitations.  Gastrointestinal: Negative for abdominal pain, no bowel changes.  Musculoskeletal: Negative for gait problem or joint swelling.  Skin: Negative for rash.  Neurological: Negative for dizziness  or headache.  No other specific complaints in a complete review of systems (except as listed in HPI above).   Objective  Vitals:   03/26/23 1307  BP: 134/80  Pulse: 88  Resp: 16  Temp: 97.9 F (36.6 C)  TempSrc: Oral  SpO2: 98%  Weight: 192 lb (87.1 kg)  Height: 5' 5.5" (1.664 m)    Body mass index is 31.46 kg/m.  Physical Exam  Constitutional: Patient appears well-developed and well-nourished. Obese  No distress.  HEENT: head atraumatic, normocephalic, pupils equal and reactive to light, neck supple Cardiovascular: Normal rate, regular rhythm and normal heart sounds.  No murmur heard. No BLE edema. Pulmonary/Chest: Effort normal and breath sounds normal. No respiratory distress. Abdominal: Soft.  There is no tenderness. Psychiatric: Patient has a normal mood and affect. behavior is normal. Judgment and thought  content normal.   PHQ2/9:    03/26/2023    1:05 PM 07/07/2022    9:37 AM 06/28/2021    9:17 AM 03/20/2020   10:18 AM 02/22/2019   10:04 AM  Depression screen PHQ 2/9  Decreased Interest 0 0 0 0 0  Down, Depressed, Hopeless 0 0 0 0 0  PHQ - 2 Score 0 0 0 0 0  Altered sleeping 0 3 1    Tired, decreased energy 0 0 0    Change in appetite 0 0 0    Feeling bad or failure about yourself  0 0 0    Trouble concentrating 0 0 0    Moving slowly or fidgety/restless 0 0 0    Suicidal thoughts 0 0 0    PHQ-9 Score 0 3 1    Difficult doing work/chores Not difficult at all Not difficult at all Not difficult at all      phq 9 is negative   Fall Risk:    03/26/2023    1:04 PM 07/07/2022    9:37 AM 09/20/2021    9:12 AM 06/28/2021    9:17 AM 05/08/2021    8:57 AM  Fall Risk   Falls in the past year? 0 0 0 0 0  Number falls in past yr: 0 0  0 0  Injury with Fall? 0 0  0 0  Risk for fall due to : No Fall Risks No Fall Risks  No Fall Risks   Follow up Falls prevention discussed;Education provided;Falls evaluation completed Falls evaluation completed  Falls evaluation  completed       Functional Status Survey: Is the patient deaf or have difficulty hearing?: Yes Does the patient have difficulty seeing, even when wearing glasses/contacts?: No Does the patient have difficulty concentrating, remembering, or making decisions?: No Does the patient have difficulty walking or climbing stairs?: No Does the patient have difficulty dressing or bathing?: No Does the patient have difficulty doing errands alone such as visiting a doctor's office or shopping?: No    Assessment & Plan   1. Encounter for medical examination to establish care  Discussed medicare wellness exams, CPE and also RSV, Tdap and Shingles vaccines today. She will get Tdap at local pharmacy, not interested on other vaccines at this time   2. Mild carotid artery disease (HCC)  - Lipid panel  3. Osteopenia after menopause  - DG Bone Density; Future  4. History of Helicobacter pylori infection  No symptoms and will see GI soon   5. Hypercholesteremia  - Lipid panel  6. Primary hypertension  - CBC with Differential/Platelet - COMPLETE METABOLIC PANEL WITH GFR  7. Other neutropenia (HCC)  - CBC with Differential/Platelet  8. Breast cancer screening by mammogram  - MM 3D SCREENING MAMMOGRAM BILATERAL BREAST; Future  9. Calculus of kidney  No problems  We will refer her to Urologist   10. Calculus of gallbladder without cholecystitis without obstruction  No symptoms  11. Paresthesia  - B12 and Folate Panel   12. Voiding difficulty  - Ambulatory referral to Urology

## 2023-03-26 ENCOUNTER — Ambulatory Visit (INDEPENDENT_AMBULATORY_CARE_PROVIDER_SITE_OTHER): Payer: Medicare HMO | Admitting: Family Medicine

## 2023-03-26 ENCOUNTER — Encounter: Payer: Self-pay | Admitting: Family Medicine

## 2023-03-26 VITALS — BP 134/80 | HR 88 | Temp 97.9°F | Resp 16 | Ht 65.5 in | Wt 192.0 lb

## 2023-03-26 DIAGNOSIS — I779 Disorder of arteries and arterioles, unspecified: Secondary | ICD-10-CM | POA: Diagnosis not present

## 2023-03-26 DIAGNOSIS — Z1231 Encounter for screening mammogram for malignant neoplasm of breast: Secondary | ICD-10-CM

## 2023-03-26 DIAGNOSIS — E7800 Pure hypercholesterolemia, unspecified: Secondary | ICD-10-CM

## 2023-03-26 DIAGNOSIS — K8020 Calculus of gallbladder without cholecystitis without obstruction: Secondary | ICD-10-CM | POA: Insufficient documentation

## 2023-03-26 DIAGNOSIS — M8580 Other specified disorders of bone density and structure, unspecified site: Secondary | ICD-10-CM

## 2023-03-26 DIAGNOSIS — N200 Calculus of kidney: Secondary | ICD-10-CM

## 2023-03-26 DIAGNOSIS — I10 Essential (primary) hypertension: Secondary | ICD-10-CM | POA: Diagnosis not present

## 2023-03-26 DIAGNOSIS — R202 Paresthesia of skin: Secondary | ICD-10-CM

## 2023-03-26 DIAGNOSIS — D708 Other neutropenia: Secondary | ICD-10-CM

## 2023-03-26 DIAGNOSIS — Z8619 Personal history of other infectious and parasitic diseases: Secondary | ICD-10-CM | POA: Insufficient documentation

## 2023-03-26 DIAGNOSIS — R39198 Other difficulties with micturition: Secondary | ICD-10-CM | POA: Diagnosis not present

## 2023-03-26 DIAGNOSIS — Z0000 Encounter for general adult medical examination without abnormal findings: Secondary | ICD-10-CM | POA: Diagnosis not present

## 2023-03-26 DIAGNOSIS — Z13820 Encounter for screening for osteoporosis: Secondary | ICD-10-CM

## 2023-03-26 DIAGNOSIS — B070 Plantar wart: Secondary | ICD-10-CM

## 2023-03-26 DIAGNOSIS — Z780 Asymptomatic menopausal state: Secondary | ICD-10-CM

## 2023-03-26 NOTE — Patient Instructions (Signed)
Get Tdap , RSV and Shingrix

## 2023-03-27 DIAGNOSIS — I779 Disorder of arteries and arterioles, unspecified: Secondary | ICD-10-CM | POA: Diagnosis not present

## 2023-03-27 DIAGNOSIS — R202 Paresthesia of skin: Secondary | ICD-10-CM | POA: Diagnosis not present

## 2023-03-27 DIAGNOSIS — E7800 Pure hypercholesterolemia, unspecified: Secondary | ICD-10-CM | POA: Diagnosis not present

## 2023-03-27 DIAGNOSIS — D708 Other neutropenia: Secondary | ICD-10-CM | POA: Diagnosis not present

## 2023-03-27 DIAGNOSIS — I10 Essential (primary) hypertension: Secondary | ICD-10-CM | POA: Diagnosis not present

## 2023-03-28 LAB — CBC WITH DIFFERENTIAL/PLATELET
Absolute Monocytes: 312 cells/uL (ref 200–950)
Basophils Absolute: 20 cells/uL (ref 0–200)
Basophils Relative: 0.5 %
Eosinophils Absolute: 264 cells/uL (ref 15–500)
Eosinophils Relative: 6.6 %
HCT: 40 % (ref 35.0–45.0)
Hemoglobin: 13.3 g/dL (ref 11.7–15.5)
Lymphs Abs: 920 cells/uL (ref 850–3900)
MCH: 31.6 pg (ref 27.0–33.0)
MCHC: 33.3 g/dL (ref 32.0–36.0)
MCV: 95 fL (ref 80.0–100.0)
MPV: 11.3 fL (ref 7.5–12.5)
Monocytes Relative: 7.8 %
Neutro Abs: 2484 cells/uL (ref 1500–7800)
Neutrophils Relative %: 62.1 %
Platelets: 204 10*3/uL (ref 140–400)
RBC: 4.21 10*6/uL (ref 3.80–5.10)
RDW: 12.9 % (ref 11.0–15.0)
Total Lymphocyte: 23 %
WBC: 4 10*3/uL (ref 3.8–10.8)

## 2023-03-28 LAB — COMPLETE METABOLIC PANEL WITH GFR
AG Ratio: 1.2 (calc) (ref 1.0–2.5)
ALT: 21 U/L (ref 6–29)
AST: 20 U/L (ref 10–35)
Albumin: 4.1 g/dL (ref 3.6–5.1)
Alkaline phosphatase (APISO): 87 U/L (ref 37–153)
BUN: 12 mg/dL (ref 7–25)
CO2: 27 mmol/L (ref 20–32)
Calcium: 9.6 mg/dL (ref 8.6–10.4)
Chloride: 108 mmol/L (ref 98–110)
Creat: 0.7 mg/dL (ref 0.60–1.00)
Globulin: 3.3 g/dL (calc) (ref 1.9–3.7)
Glucose, Bld: 92 mg/dL (ref 65–99)
Potassium: 4.6 mmol/L (ref 3.5–5.3)
Sodium: 144 mmol/L (ref 135–146)
Total Bilirubin: 0.5 mg/dL (ref 0.2–1.2)
Total Protein: 7.4 g/dL (ref 6.1–8.1)
eGFR: 89 mL/min/{1.73_m2} (ref >=60)

## 2023-03-28 LAB — LIPID PANEL
Cholesterol: 199 mg/dL (ref <200)
HDL: 77 mg/dL (ref >=50)
LDL Cholesterol (Calc): 107 mg/dL (calc) — ABNORMAL HIGH
Non-HDL Cholesterol (Calc): 122 mg/dL (calc) (ref <130)
Total CHOL/HDL Ratio: 2.6 (calc) (ref <5.0)
Triglycerides: 67 mg/dL (ref <150)

## 2023-03-28 LAB — B12 AND FOLATE PANEL
Folate: 22.8 ng/mL
Vitamin B-12: 796 pg/mL (ref 200–1100)

## 2023-03-30 ENCOUNTER — Telehealth: Payer: Self-pay | Admitting: Family Medicine

## 2023-03-30 ENCOUNTER — Other Ambulatory Visit: Payer: Self-pay | Admitting: Family Medicine

## 2023-03-30 MED ORDER — ATORVASTATIN CALCIUM 80 MG PO TABS: 80.0000 mg | ORAL_TABLET | Freq: Every day | ORAL | 0 refills | Status: DC

## 2023-03-30 NOTE — Telephone Encounter (Signed)
Called patient, left voicemail to reflect Dr. Carlynn Purl' advice

## 2023-03-30 NOTE — Telephone Encounter (Signed)
Pt. Given results and instructions, verbalizes understanding. States "since I have Atorvastatin here , I'll just increase my dose."

## 2023-04-07 DIAGNOSIS — Z0100 Encounter for examination of eyes and vision without abnormal findings: Secondary | ICD-10-CM | POA: Diagnosis not present

## 2023-04-07 DIAGNOSIS — H5203 Hypermetropia, bilateral: Secondary | ICD-10-CM | POA: Diagnosis not present

## 2023-04-14 ENCOUNTER — Ambulatory Visit (INDEPENDENT_AMBULATORY_CARE_PROVIDER_SITE_OTHER): Payer: Medicare HMO

## 2023-04-14 ENCOUNTER — Encounter: Payer: Self-pay | Admitting: Podiatry

## 2023-04-14 ENCOUNTER — Ambulatory Visit: Payer: Medicare HMO | Admitting: Podiatry

## 2023-04-14 DIAGNOSIS — M2041 Other hammer toe(s) (acquired), right foot: Secondary | ICD-10-CM

## 2023-04-14 DIAGNOSIS — D2371 Other benign neoplasm of skin of right lower limb, including hip: Secondary | ICD-10-CM | POA: Diagnosis not present

## 2023-04-14 NOTE — Progress Notes (Signed)
Chief Complaint  Patient presents with   Toe Pain    "This third toe hurts." N - toe hurts L - distal 3rd right D - 3 weeks O - suddenly, about the same C - sore A - pressure, walking T - I tried the liquid corn removal, alcohol    Subjective: 77 y.o. female presenting to the office today for evaluation of pain and tenderness associated to the distal tip of the right third toe.  Onset for few months.  Pain with ambulation.  Denies a history of injury.  She did apply some corn callus remover with no relief  Past Medical History:  Diagnosis Date   Chest pain, unspecified    Colitis 02/20/2016   H. pylori duodenitis    persistent fall 2023 treated by Dr. Lemar Livings   H/O left breast biopsy 12/11/2005   [facility], path report matched the clinical impression of left breast lipoma   HLD (hyperlipidemia)    mixed   HTN (hypertension)    Kidney stones 03/2022    Past Surgical History:  Procedure Laterality Date   ABDOMINAL HYSTERECTOMY  2007   total   BREAST BIOPSY Left 2007   Dr. Rutherford Nail office-benign   COLONOSCOPY  11/12/2001   multiple sessile polyps found in rectum, 5mm in size, distributed diffusely, biopsies were taken. The polyps extended from lower rectum to approximately 20 cm. They were confluent in the area between 10-15cm. Small polyps with central ulceration were noted.   COLONOSCOPY WITH PROPOFOL N/A 08/04/2018   Procedure: COLONOSCOPY WITH PROPOFOL;  Surgeon: Earline Mayotte, MD;  Location: [facility];  Service: Endoscopy;  Laterality: N/A;   COLONOSCOPY WITH PROPOFOL N/A 06/04/2022   Procedure: COLONOSCOPY WITH PROPOFOL;  Surgeon: Earline Mayotte, MD;  Location: [facility];  Service: Endoscopy;  Laterality: N/A;   ESOPHAGOGASTRODUODENOSCOPY (EGD) WITH PROPOFOL N/A 06/04/2022   Procedure: ESOPHAGOGASTRODUODENOSCOPY (EGD) WITH PROPOFOL;  Surgeon: Earline Mayotte, MD;  Location: [facility];  Service: Endoscopy;  Laterality: N/A;  with polypectomy    ESOPHAGOGASTRODUODENOSCOPY (EGD) WITH PROPOFOL N/A 07/30/2022   Procedure: ESOPHAGOGASTRODUODENOSCOPY (EGD) WITH PROPOFOL;  Surgeon: Earline Mayotte, MD;  Location: [facility];  Service: Endoscopy;  Laterality: N/A;   TUBAL LIGATION     UMBILICAL HERNIA REPAIR N/A 10/01/2021   Procedure: HERNIA REPAIR UMBILICAL ADULT, open;  Surgeon: Henrene Dodge, MD;  Location: [facility];  Service: General;  Laterality: N/A;    No Known Allergies   Objective:  Physical Exam General: Alert and oriented x3 in no acute distress  Dermatology: Hyperkeratotic lesion(s) present on the distal tip of the right third toe. Pain on palpation with a central nucleated core noted. Skin is warm, dry and supple bilateral lower extremities. Negative for open lesions or macerations.  Vascular: Palpable pedal pulses bilaterally. No edema or erythema noted. Capillary refill within normal limits.  Neurological: Grossly intact via light touch  Musculoskeletal Exam: Pain on palpation at the keratotic lesion(s) noted.  Hammertoe contracture noted to the lesser digits of the right foot  Radiographic exam RT foot 04/14/2023: Hammertoe contracture noted to the lesser digits of the right foot.  Normal osseous mineralization.  Joint spaces preserved.  No acute fractures noted.  Impression: Negative  Assessment: 1.  Symptomatic benign skin lesion distal tip of the right third toe   Plan of Care:  -Patient evaluated -Excisional debridement of keratoic lesion(s) using a chisel blade was performed without incident.  -Salicylic acid applied with a bandaid -Recommend OTC salicylic acid daily as  needed -Return to the clinic PRN.   Felecia Shelling, DPM [facility]  Dr. Felecia Shelling, DPM    [address] [place], [place] [address]                Office [phone]  Fax [phone]

## 2023-04-22 DIAGNOSIS — K31A0 Gastric intestinal metaplasia, unspecified: Secondary | ICD-10-CM | POA: Diagnosis not present

## 2023-05-04 ENCOUNTER — Encounter: Payer: Self-pay | Admitting: Urology

## 2023-05-04 ENCOUNTER — Ambulatory Visit (INDEPENDENT_AMBULATORY_CARE_PROVIDER_SITE_OTHER): Payer: Medicare HMO | Admitting: Urology

## 2023-05-04 VITALS — BP 126/82 | HR 97 | Wt 189.0 lb

## 2023-05-04 DIAGNOSIS — Z8619 Personal history of other infectious and parasitic diseases: Secondary | ICD-10-CM

## 2023-05-04 DIAGNOSIS — N200 Calculus of kidney: Secondary | ICD-10-CM | POA: Diagnosis not present

## 2023-05-04 DIAGNOSIS — Z8742 Personal history of other diseases of the female genital tract: Secondary | ICD-10-CM | POA: Diagnosis not present

## 2023-05-04 DIAGNOSIS — N993 Prolapse of vaginal vault after hysterectomy: Secondary | ICD-10-CM

## 2023-05-04 DIAGNOSIS — R399 Unspecified symptoms and signs involving the genitourinary system: Secondary | ICD-10-CM | POA: Diagnosis not present

## 2023-05-04 LAB — MICROSCOPIC EXAMINATION

## 2023-05-04 LAB — URINALYSIS, COMPLETE
Bilirubin, UA: NEGATIVE
Glucose, UA: NEGATIVE
Leukocytes,UA: NEGATIVE
Nitrite, UA: NEGATIVE
Specific Gravity, UA: 1.02 (ref 1.005–1.030)
Urobilinogen, Ur: 0.2 mg/dL (ref 0.2–1.0)
pH, UA: 6 (ref 5.0–7.5)

## 2023-05-04 LAB — BLADDER SCAN AMB NON-IMAGING

## 2023-05-04 NOTE — Progress Notes (Signed)
I,Dina M Abdulla,acting as a scribe for Riki Altes, MD.,have documented all relevant documentation on the behalf of Riki Altes, MD,as directed by  Riki Altes, MD while in the presence of Riki Altes, MD.  05/04/2023 3:07 PM   Merry Proud [birth year] [phone]  Referring provider: Alba Cory, MD [address] [place],  [place] [address]  Chief Complaint  Patient presents with   calculus of kidney    HPI: NIKELLE TUSS is a 77 y.o. female referred for history of urinary calculi.  Relates to a prior history of stone disease. She was in the ED approximately one year ago 04/06/22, and had a CT showing a 3 mm stone in the upper pole of the right kidney, which was non-obstructing. She has had no flank/abdominal pain. She states the primary reason of her visit today is a history of vaginal prolapse with obstructive voiding symptoms. Denies dysuria or gross hematuria. No history of recurrent UTI.   PMH: Past Medical History:  Diagnosis Date   Chest pain, unspecified    Colitis 02/20/2016   H. pylori duodenitis    persistent fall 2023 treated by Dr. Lemar Livings   H/O left breast biopsy 12/11/2005   [facility], path report matched the clinical impression of left breast lipoma   HLD (hyperlipidemia)    mixed   HTN (hypertension)    Kidney stones 03/2022    Surgical History: Past Surgical History:  Procedure Laterality Date   ABDOMINAL HYSTERECTOMY  2007   total   BREAST BIOPSY Left 2007   Dr. Rutherford Nail office-benign   COLONOSCOPY  11/12/2001   multiple sessile polyps found in rectum, 5mm in size, distributed diffusely, biopsies were taken. The polyps extended from lower rectum to approximately 20 cm. They were confluent in the area between 10-15cm. Small polyps with central ulceration were noted.   COLONOSCOPY WITH PROPOFOL N/A 08/04/2018   Procedure: COLONOSCOPY WITH PROPOFOL;  Surgeon: Earline Mayotte, MD;  Location: [facility];  Service:  Endoscopy;  Laterality: N/A;   COLONOSCOPY WITH PROPOFOL N/A 06/04/2022   Procedure: COLONOSCOPY WITH PROPOFOL;  Surgeon: Earline Mayotte, MD;  Location: [facility];  Service: Endoscopy;  Laterality: N/A;   ESOPHAGOGASTRODUODENOSCOPY (EGD) WITH PROPOFOL N/A 06/04/2022   Procedure: ESOPHAGOGASTRODUODENOSCOPY (EGD) WITH PROPOFOL;  Surgeon: Earline Mayotte, MD;  Location: [facility];  Service: Endoscopy;  Laterality: N/A;  with polypectomy   ESOPHAGOGASTRODUODENOSCOPY (EGD) WITH PROPOFOL N/A 07/30/2022   Procedure: ESOPHAGOGASTRODUODENOSCOPY (EGD) WITH PROPOFOL;  Surgeon: Earline Mayotte, MD;  Location: [facility];  Service: Endoscopy;  Laterality: N/A;   TUBAL LIGATION     UMBILICAL HERNIA REPAIR N/A 10/01/2021   Procedure: HERNIA REPAIR UMBILICAL ADULT, open;  Surgeon: Henrene Dodge, MD;  Location: [facility];  Service: General;  Laterality: N/A;    Home Medications:  Allergies as of 05/04/2023   No Known Allergies      Medication List        Accurate as of May 04, 2023  3:07 PM. If you have any questions, ask your nurse or doctor.          acetaminophen 500 MG tablet Commonly known as: TYLENOL Take 2 tablets (1,000 mg total) by mouth every 6 (six) hours as needed for mild pain.   amLODipine 2.5 MG tablet Commonly known as: NORVASC TAKE 1 TABLET EVERY DAY   aspirin EC 81 MG tablet Take by mouth.   atorvastatin 80 MG tablet Commonly known as: LIPITOR Take 1 tablet (  80 mg total) by mouth daily.   metoprolol succinate 25 MG 24 hr tablet Commonly known as: TOPROL-XL Take 1 tablet (25 mg total) by mouth daily.   multivitamin with minerals Tabs tablet Take 1 tablet by mouth in the morning. Centrum for Women   nystatin ointment Commonly known as: MYCOSTATIN Apply 1 Application topically 2 (two) times daily.   VITAMIN D3 PO Take 5,000 Units by mouth in the morning.   zinc gluconate 50 MG tablet Take 50 mg by mouth daily.        Family  History: Family History  Problem Relation Age of Onset   Cancer Other        ovarian, breast, colon cancers   Hypertension Mother    Hyperlipidemia Mother    Other Mother        enlarged heart   Heart disease Father 15   Diabetes Father    Diabetes Sister    Hypertension Sister    Kidney disease Sister    Stroke Sister    Heart disease Brother    Hypertension Brother    Diabetes Brother    Healthy Brother    Cancer Other        family hx   Coronary artery disease Other        family hx   Breast cancer Neg Hx     Social History:  reports that she has never smoked. She has never used smokeless tobacco. She reports that she does not drink alcohol and does not use drugs.   Physical Exam: BP 126/82   Pulse 97   Wt 189 lb (85.7 kg)   BMI 30.97 kg/m   Constitutional:  Alert and oriented, No acute distress. HEENT: [place] AT Respiratory: Normal respiratory effort, no increased work of breathing. Psychiatric: Normal mood and affect.    Assessment & Plan:    1. Nephrolithiasis She declined a KUB. 3 mm right renal calculus on CT July 2023.  2. Lower urinary tract symptoms Most likely secondary to cystocele. PVR today 15 mL Discussed with patient cystocele/prolapse are evaluated and treated by gynecology. She does not have a gynecologist and a referral was placed.  3.  History of vaginal wall prolapse GYN referral for further evaluation  I have reviewed the above documentation for accuracy and completeness, and I agree with the above.   Riki Altes, MD  [facility] [address] [place], [place] [address] [phone]

## 2023-05-06 ENCOUNTER — Other Ambulatory Visit: Payer: Self-pay | Admitting: Family Medicine

## 2023-05-06 ENCOUNTER — Telehealth: Payer: Self-pay | Admitting: Family Medicine

## 2023-05-06 NOTE — Telephone Encounter (Signed)
Pt is calling in because she said her and Dr. Carlynn Purl discussed changing the dose on her cholesterol medication. Pt says she would like Dr. Carlynn Purl to go ahead and initiate that change to 80 MG because if not her pharmacy will continue to send her the lower dosage of medication.

## 2023-05-06 NOTE — Telephone Encounter (Signed)
Medication Refill - Medication: metoprolol succinate (TOPROL-XL) 25 MG 24 hr tablet [[id number]]   Has the patient contacted their pharmacy? Yes.    (Agent: If yes, when and what did the pharmacy advise?) Contact office   Preferred Pharmacy (with phone number or street name): [facility] - [place], [place] - [address]   Has the patient been seen for an appointment in the last year OR does the patient have an upcoming appointment? Yes.    Agent: Please be advised that RX refills may take up to 3 business days. We ask that you follow-up with your pharmacy.

## 2023-05-07 MED ORDER — METOPROLOL SUCCINATE ER 25 MG PO TB24: 25.0000 mg | ORAL_TABLET | Freq: Every day | ORAL | 0 refills | Status: DC

## 2023-05-07 NOTE — Telephone Encounter (Signed)
Unable to refill per protocol, Rx request is too soon. Last refill 03/30/23 for 90 days.E-Prescribing Status: Receipt confirmed by pharmacy (03/30/2023 12:18 PM EDT).  Requested Prescriptions  Pending Prescriptions Disp Refills   atorvastatin (LIPITOR) 40 MG tablet [Pharmacy Med Name: ATORVASTATIN CALCIUM 40 MG Tablet] 90 tablet 3    Sig: TAKE 1 TABLET EVERY DAY     Cardiovascular:  Antilipid - Statins Failed - 05/06/2023  3:06 AM      Failed - Lipid Panel in normal range within the last 12 months    Cholesterol, Total  Date Value Ref Range Status  07/07/2022 197 100 - 199 mg/dL Final   Cholesterol  Date Value Ref Range Status  03/27/2023 199 <200 mg/dL Final   LDL Cholesterol (Calc)  Date Value Ref Range Status  03/27/2023 107 (H) mg/dL (calc) Final    Comment:    Reference range: <100 . Desirable range <100 mg/dL for primary prevention;   <70 mg/dL for patients with CHD or diabetic patients  with > or = 2 CHD risk factors. Marland Kitchen LDL-C is now calculated using the Martin-Hopkins  calculation, which is a validated novel method providing  better accuracy than the Friedewald equation in the  estimation of LDL-C.  Horald Pollen et al. Lenox Ahr. 1610;960(45): 2061-2068  (http://education.[employer].com/faq/FAQ164)    HDL  Date Value Ref Range Status  03/27/2023 77 > OR = 50 mg/dL Final  40/98/1191 72 >47 mg/dL Final   Triglycerides  Date Value Ref Range Status  03/27/2023 67 <150 mg/dL Final         Passed - Patient is not pregnant      Passed - Valid encounter within last 12 months    Recent Outpatient Visits           1 month ago Encounter for medical examination to establish care   [facility] Alba Cory, MD   6 months ago [facility]   [facility] Malva Limes, MD   10 months ago Annual physical exam   [facility] Malva Limes, MD   1 year ago Annual physical exam   [facility] Malva Limes, MD   2 years ago Mass of subcutaneous tissue   [facility] Malva Limes, MD       Future Appointments             In 3 months Carlynn Purl, Danna Hefty, MD [facility]   In 3 months  [facility], [facility]

## 2023-05-07 NOTE — Telephone Encounter (Signed)
Requested Prescriptions  Pending Prescriptions Disp Refills   metoprolol succinate (TOPROL-XL) 25 MG 24 hr tablet 90 tablet 0    Sig: Take 1 tablet (25 mg total) by mouth daily.     Cardiovascular:  Beta Blockers Passed - 05/06/2023  4:15 PM      Passed - Last BP in normal range    BP Readings from Last 1 Encounters:  05/04/23 126/82         Passed - Last Heart Rate in normal range    Pulse Readings from Last 1 Encounters:  05/04/23 97         Passed - Valid encounter within last 6 months    Recent Outpatient Visits           1 month ago Encounter for medical examination to establish care   [facility] Alba Cory, MD   6 months ago [facility]   [facility] Malva Limes, MD   10 months ago Annual physical exam   [facility] Malva Limes, MD   1 year ago Annual physical exam   [facility] Malva Limes, MD   2 years ago Mass of subcutaneous tissue   [facility] Malva Limes, MD       Future Appointments             In 3 months Carlynn Purl, Danna Hefty, MD [facility], [facility]   In 3 months  [facility], [facility]

## 2023-06-04 ENCOUNTER — Ambulatory Visit
Admission: RE | Admit: 2023-06-04 | Discharge: 2023-06-04 | Disposition: A | Discharge status: Home / Self Care | Payer: Medicare HMO | Source: Ambulatory Visit | Attending: Family Medicine | Admitting: Family Medicine

## 2023-06-04 DIAGNOSIS — M8580 Other specified disorders of bone density and structure, unspecified site: Secondary | ICD-10-CM | POA: Insufficient documentation

## 2023-06-04 DIAGNOSIS — Z780 Asymptomatic menopausal state: Secondary | ICD-10-CM | POA: Insufficient documentation

## 2023-06-04 DIAGNOSIS — M85851 Other specified disorders of bone density and structure, right thigh: Secondary | ICD-10-CM | POA: Diagnosis not present

## 2023-06-04 DIAGNOSIS — Z1231 Encounter for screening mammogram for malignant neoplasm of breast: Secondary | ICD-10-CM | POA: Insufficient documentation

## 2023-07-05 ENCOUNTER — Other Ambulatory Visit: Payer: Self-pay | Admitting: Family Medicine

## 2023-07-06 NOTE — Telephone Encounter (Signed)
Rx was sent to pharmacy 09/25/22 #90/3  Requested Prescriptions  Pending Prescriptions Disp Refills   amLODipine (NORVASC) 2.5 MG tablet [Pharmacy Med Name: amLODIPine Besylate Oral Tablet 2.5 MG] 90 tablet 3    Sig: TAKE 1 TABLET EVERY DAY     Cardiovascular: Calcium Channel Blockers 2 Passed - 07/05/2023 11:20 AM      Passed - Last BP in normal range    BP Readings from Last 1 Encounters:  05/04/23 126/82         Passed - Last Heart Rate in normal range    Pulse Readings from Last 1 Encounters:  05/04/23 97         Passed - Valid encounter within last 6 months    Recent Outpatient Visits           3 months ago Encounter for medical examination to establish care   [facility] Alba Cory, MD   8 months ago Intertrigo   [facility] Malva Limes, MD   12 months ago Annual physical exam   [facility] Malva Limes, MD   2 years ago Annual physical exam   [facility] Malva Limes, MD   2 years ago Mass of subcutaneous tissue   [facility] Malva Limes, MD       Future Appointments             In 1 month Carlynn Purl, Danna Hefty, MD [facility]   In 1 month  [facility], [facility]

## 2023-07-10 ENCOUNTER — Encounter: Payer: Medicare HMO | Admitting: Family Medicine

## 2023-07-27 ENCOUNTER — Encounter: Payer: Self-pay | Admitting: *Deleted

## 2023-07-30 ENCOUNTER — Other Ambulatory Visit: Payer: Self-pay | Admitting: Family Medicine

## 2023-08-05 ENCOUNTER — Other Ambulatory Visit: Payer: Self-pay | Admitting: Family Medicine

## 2023-08-06 ENCOUNTER — Encounter: Payer: Self-pay | Admitting: *Deleted

## 2023-08-07 ENCOUNTER — Encounter: Payer: Self-pay | Admitting: *Deleted

## 2023-08-07 ENCOUNTER — Ambulatory Visit
Admission: RE | Admit: 2023-08-07 | Discharge: 2023-08-07 | Disposition: A | Discharge status: Home / Self Care | Payer: Medicare HMO | Attending: Gastroenterology | Admitting: Gastroenterology

## 2023-08-07 ENCOUNTER — Ambulatory Visit: Payer: Medicare HMO | Admitting: Certified Registered Nurse Anesthetist

## 2023-08-07 ENCOUNTER — Encounter
Admission: RE | Disposition: A | Discharge status: Home / Self Care | Payer: Self-pay | Source: Home / Self Care | Attending: Gastroenterology

## 2023-08-07 DIAGNOSIS — K31A0 Gastric intestinal metaplasia, unspecified: Secondary | ICD-10-CM | POA: Diagnosis not present

## 2023-08-07 DIAGNOSIS — K2950 Unspecified chronic gastritis without bleeding: Secondary | ICD-10-CM | POA: Diagnosis not present

## 2023-08-07 DIAGNOSIS — Z79899 Other long term (current) drug therapy: Secondary | ICD-10-CM | POA: Diagnosis not present

## 2023-08-07 DIAGNOSIS — I739 Peripheral vascular disease, unspecified: Secondary | ICD-10-CM | POA: Insufficient documentation

## 2023-08-07 DIAGNOSIS — E785 Hyperlipidemia, unspecified: Secondary | ICD-10-CM | POA: Insufficient documentation

## 2023-08-07 DIAGNOSIS — I10 Essential (primary) hypertension: Secondary | ICD-10-CM | POA: Diagnosis not present

## 2023-08-07 DIAGNOSIS — K31A15 Gastric intestinal metaplasia without dysplasia, involving multiple sites: Secondary | ICD-10-CM | POA: Insufficient documentation

## 2023-08-07 DIAGNOSIS — K31A19 Gastric intestinal metaplasia without dysplasia, unspecified site: Secondary | ICD-10-CM | POA: Diagnosis not present

## 2023-08-07 DIAGNOSIS — K219 Gastro-esophageal reflux disease without esophagitis: Secondary | ICD-10-CM | POA: Diagnosis not present

## 2023-08-07 DIAGNOSIS — K3189 Other diseases of stomach and duodenum: Secondary | ICD-10-CM | POA: Diagnosis present

## 2023-08-07 DIAGNOSIS — G629 Polyneuropathy, unspecified: Secondary | ICD-10-CM | POA: Diagnosis not present

## 2023-08-07 HISTORY — PX: BIOPSY: SHX5522

## 2023-08-07 HISTORY — PX: ESOPHAGOGASTRODUODENOSCOPY (EGD) WITH PROPOFOL: SHX5813

## 2023-08-07 SURGERY — ESOPHAGOGASTRODUODENOSCOPY (EGD) WITH PROPOFOL
Anesthesia: General

## 2023-08-07 MED ORDER — LIDOCAINE HCL (CARDIAC) PF 100 MG/5ML IV SOSY
PREFILLED_SYRINGE | INTRAVENOUS | Status: DC | PRN
  Administered 2023-08-07: 100 mg via INTRAVENOUS

## 2023-08-07 MED ORDER — PROPOFOL 10 MG/ML IV BOLUS
INTRAVENOUS | Status: DC | PRN
  Administered 2023-08-07: 60 mg via INTRAVENOUS
  Administered 2023-08-07: 10 mg via INTRAVENOUS

## 2023-08-07 MED ORDER — SODIUM CHLORIDE 0.9 % IV SOLN: INTRAVENOUS | Status: DC

## 2023-08-07 MED ORDER — PHENYLEPHRINE 80 MCG/ML (10ML) SYRINGE FOR IV PUSH (FOR BLOOD PRESSURE SUPPORT)
PREFILLED_SYRINGE | INTRAVENOUS | Status: DC | PRN
  Administered 2023-08-07 (×2): 80 ug via INTRAVENOUS

## 2023-08-07 MED ORDER — PHENYLEPHRINE 80 MCG/ML (10ML) SYRINGE FOR IV PUSH (FOR BLOOD PRESSURE SUPPORT)
PREFILLED_SYRINGE | INTRAVENOUS | Status: AC
Start: 2023-08-07 — End: ?
  Filled 2023-08-07: qty 10

## 2023-08-07 MED ORDER — PROPOFOL 500 MG/50ML IV EMUL
INTRAVENOUS | Status: DC | PRN
  Administered 2023-08-07: 160 ug/kg/min via INTRAVENOUS

## 2023-08-07 NOTE — Anesthesia Procedure Notes (Signed)
Date/Time: 08/07/2023 8:45 AM  Performed by: Malva Cogan, CRNAPre-anesthesia Checklist: Patient identified, Emergency Drugs available, Suction available, Patient being monitored and Timeout performed Patient Re-evaluated:Patient Re-evaluated prior to induction Oxygen Delivery Method: Nasal cannula Induction Type: IV induction Placement Confirmation: CO2 detector and positive ETCO2

## 2023-08-07 NOTE — Transfer of Care (Signed)
Immediate Anesthesia Transfer of Care Note  Patient: [name] [name]  Procedure(s) Performed: ESOPHAGOGASTRODUODENOSCOPY (EGD) WITH PROPOFOL BIOPSY  Patient Location: PACU  Anesthesia Type:General  Level of Consciousness: drowsy  Airway & Oxygen Therapy: Pt spontaneously breathing  Post-op Assessment: Report given to RN and Post -op Vital signs reviewed and stable  Post vital signs: Reviewed and stable  Last Vitals:  Vitals Value Taken Time  BP 100/66 08/07/23 0905  Temp 35.6 C 08/07/23 0905  Pulse 67 08/07/23 0906  Resp 15 08/07/23 0905  SpO2 97 % 08/07/23 0906  Vitals shown include unfiled device data.  Last Pain:  Vitals:   08/07/23 0905  TempSrc: Temporal  PainSc: Asleep         Complications: No notable events documented.

## 2023-08-07 NOTE — Anesthesia Preprocedure Evaluation (Addendum)
Anesthesia Evaluation  Patient identified by MRN, date of birth, ID band Patient awake    Reviewed: Allergy & Precautions, NPO status , Patient's Chart, lab work & pertinent test results  History of Anesthesia Complications Negative for: history of anesthetic complications  Airway Mallampati: III  TM Distance: >3 FB Neck ROM: full    Dental no notable dental hx.    Pulmonary neg pulmonary ROS   Pulmonary exam normal        Cardiovascular hypertension, On Medications + Peripheral Vascular Disease  Normal cardiovascular exam     Neuro/Psych  Neuromuscular disease (neuropathy bilateral hands)  negative psych ROS   GI/Hepatic Neg liver ROS,GERD  ,,  Endo/Other  negative endocrine ROS    Renal/GU negative Renal ROS  negative genitourinary   Musculoskeletal   Abdominal   Peds  Hematology negative hematology ROS (+)   Anesthesia Other Findings Past Medical History: No date: Chest pain, unspecified 02/20/2016: Colitis No date: H. pylori duodenitis     Comment:  persistent fall 2023 treated by Dr. Lemar Livings 12/11/2005: H/O left breast biopsy     Comment:  [facility], path report matched the clinical impression of               left breast lipoma No date: HLD (hyperlipidemia)     Comment:  mixed No date: HTN (hypertension) 03/2022: Kidney stones  Past Surgical History: 2007: ABDOMINAL HYSTERECTOMY     Comment:  total 2007: BREAST BIOPSY; Left     Comment:  Dr. Rutherford Nail office-benign 11/12/2001: COLONOSCOPY     Comment:  multiple sessile polyps found in rectum, 5mm in size,               distributed diffusely, biopsies were taken. The polyps               extended from lower rectum to approximately 20 cm. They               were confluent in the area between 10-15cm. Small polyps               with central ulceration were noted. 08/04/2018: COLONOSCOPY WITH PROPOFOL; N/A     Comment:  Procedure: COLONOSCOPY WITH  PROPOFOL;  Surgeon: Earline Mayotte, MD;  Location: [facility] ENDOSCOPY;  Service:               Endoscopy;  Laterality: N/A; 06/04/2022: COLONOSCOPY WITH PROPOFOL; N/A     Comment:  Procedure: COLONOSCOPY WITH PROPOFOL;  Surgeon: Earline Mayotte, MD;  Location: [facility] ENDOSCOPY;  Service:               Endoscopy;  Laterality: N/A; 06/04/2022: ESOPHAGOGASTRODUODENOSCOPY (EGD) WITH PROPOFOL; N/A     Comment:  Procedure: ESOPHAGOGASTRODUODENOSCOPY (EGD) WITH               PROPOFOL;  Surgeon: Earline Mayotte, MD;  Location:               [facility] ENDOSCOPY;  Service: Endoscopy;  Laterality: N/A;                with polypectomy 07/30/2022: ESOPHAGOGASTRODUODENOSCOPY (EGD) WITH PROPOFOL; N/A     Comment:  Procedure: ESOPHAGOGASTRODUODENOSCOPY (EGD) WITH               PROPOFOL;  Surgeon: Earline Mayotte, MD;  Location:  [facility] ENDOSCOPY;  Service: Endoscopy;  Laterality: N/A; No date: HERNIA REPAIR No date: TUBAL LIGATION 10/01/2021: UMBILICAL HERNIA REPAIR; N/A     Comment:  Procedure: HERNIA REPAIR UMBILICAL ADULT, open;                Surgeon: Henrene Dodge, MD;  Location: [facility] ORS;                Service: General;  Laterality: N/A;  BMI    Body Mass Index: 30.60 kg/m      Reproductive/Obstetrics negative OB ROS                             Anesthesia Physical Anesthesia Plan  ASA: 2  Anesthesia Plan: General   Post-op Pain Management: Minimal or no pain anticipated   Induction: Intravenous  PONV Risk Score and Plan: 1 and Propofol infusion and TIVA  Airway Management Planned: Natural Airway and Nasal Cannula  Additional Equipment:   Intra-op Plan:   Post-operative Plan:   Informed Consent: I have reviewed the patients History and Physical, chart, labs and discussed the procedure including the risks, benefits and alternatives for the proposed anesthesia with the patient or authorized representative who has  indicated his/her understanding and acceptance.     Dental Advisory Given  Plan Discussed with: Anesthesiologist, CRNA and Surgeon  Anesthesia Plan Comments: (Patient consented for risks of anesthesia including but not limited to:  - adverse reactions to medications - risk of airway placement if required - damage to eyes, teeth, lips or other oral mucosa - nerve damage due to positioning  - sore throat or hoarseness - Damage to heart, brain, nerves, lungs, other parts of body or loss of life  Patient voiced understanding and assent.)       Anesthesia Quick Evaluation

## 2023-08-07 NOTE — Interval H&P Note (Signed)
History and Physical Interval Note:  08/07/2023 8:44 AM  [name] [name]  has presented today for surgery, with the diagnosis of Intestinal Metaplasia of gastric mucosa.  The various methods of treatment have been discussed with the patient and family. After consideration of risks, benefits and other options for treatment, the patient has consented to  Procedure(s): ESOPHAGOGASTRODUODENOSCOPY (EGD) WITH PROPOFOL (N/A) as a surgical intervention.  The patient's history has been reviewed, patient examined, no change in status, stable for surgery.  I have reviewed the patient's chart and labs.  Questions were answered to the patient's satisfaction.     Regis Bill  Ok to proceed with EGD

## 2023-08-07 NOTE — H&P (Signed)
Outpatient short stay form Pre-procedure 08/07/2023  Regis Bill, MD  Primary Physician: Alba Cory, MD  Reason for visit:  History of high grade dysplasia of the stomach  History of present illness:    77 y/o lady with history of hypertension and hyperlipidemia here for EGD due to history of high grade dysplasia of the stomach in the setting of h pylori. H pylori has been eradicated. No blood thinners. No significant abdominal surgeries. No family history of GI malignancies.    Current Facility-Administered Medications:    0.9 %  sodium chloride infusion, , Intravenous, Continuous, Suellyn Meenan, Rossie Muskrat, MD, Last Rate: 20 mL/hr at 08/07/23 0839, Continued from Pre-op at 08/07/23 0839  Medications Prior to Admission  Medication Sig Dispense Refill Last Dose   pantoprazole (PROTONIX) 40 MG tablet Take 40 mg by mouth daily.      acetaminophen (TYLENOL) 500 MG tablet Take 2 tablets (1,000 mg total) by mouth every 6 (six) hours as needed for mild pain.      amLODipine (NORVASC) 2.5 MG tablet TAKE 1 TABLET EVERY DAY 90 tablet 3    aspirin EC 81 MG tablet Take by mouth.      atorvastatin (LIPITOR) 80 MG tablet Take 1 tablet (80 mg total) by mouth daily. 90 tablet 0    Cholecalciferol (VITAMIN D3 PO) Take 5,000 Units by mouth in the morning.      metoprolol succinate (TOPROL-XL) 25 MG 24 hr tablet TAKE 1 TABLET EVERY DAY 90 tablet 0    Multiple Vitamin (MULTIVITAMIN WITH MINERALS) TABS tablet Take 1 tablet by mouth in the morning. Centrum for Women      nystatin ointment (MYCOSTATIN) Apply 1 Application topically 2 (two) times daily. 30 g 1    zinc gluconate 50 MG tablet Take 50 mg by mouth daily.        No Known Allergies   Past Medical History:  Diagnosis Date   Chest pain, unspecified    Colitis 02/20/2016   H. pylori duodenitis    persistent fall 2023 treated by Dr. Lemar Livings   H/O left breast biopsy 12/11/2005   [facility], path report matched the clinical impression of left  breast lipoma   HLD (hyperlipidemia)    mixed   HTN (hypertension)    Kidney stones 03/2022    Review of systems:  Otherwise negative.    Physical Exam  Gen: Alert, oriented. Appears stated age.  HEENT: PERRLA. Lungs: No respiratory distress CV: RRR Abd: soft, benign, no masses Ext: No edema    Planned procedures: Proceed with EGD. The patient understands the nature of the planned procedure, indications, risks, alternatives and potential complications including but not limited to bleeding, infection, perforation, damage to internal organs and possible oversedation/side effects from anesthesia. The patient agrees and gives consent to proceed.  Please refer to procedure notes for findings, recommendations and patient disposition/instructions.     Regis Bill, MD [facility]

## 2023-08-07 NOTE — Anesthesia Postprocedure Evaluation (Signed)
Anesthesia Post Note  Patient: [name] [name]  Procedure(s) Performed: ESOPHAGOGASTRODUODENOSCOPY (EGD) WITH PROPOFOL BIOPSY  Patient location during evaluation: Endoscopy Anesthesia Type: General Level of consciousness: awake and alert Pain management: pain level controlled Vital Signs Assessment: post-procedure vital signs reviewed and stable Respiratory status: spontaneous breathing, nonlabored ventilation, respiratory function stable and patient connected to nasal cannula oxygen Cardiovascular status: blood pressure returned to baseline and stable Postop Assessment: no apparent nausea or vomiting Anesthetic complications: no   No notable events documented.   Last Vitals:  Vitals:   08/07/23 0925 08/07/23 0935  BP: 128/88   Pulse:    Resp: 19   Temp:    SpO2: 100% 100%    Last Pain:  Vitals:   08/07/23 0935  TempSrc:   PainSc: 0-No pain                 Louie Boston

## 2023-08-07 NOTE — Op Note (Signed)
[facility] Patient Name: [name] [name] Procedure Date: 08/07/2023 8:38 AM MRN: [id number] Account #: [id number] Date of Birth: [birth year] Admit Type: Outpatient Age: 77 Room: [facility] ENDO ROOM 3 Gender: Female Note Status: Finalized Instrument Name: Upper Endoscope [id number] Procedure:             Upper GI endoscopy Indications:           Precancerous lesions of the stomach Providers:             Eather Colas MD, MD Referring MD:          Onnie Boer. Sowles, MD (Referring MD) Medicines:             Monitored Anesthesia Care Complications:         No immediate complications. Estimated blood loss:                         Minimal. Procedure:             Pre-Anesthesia Assessment:                        - Prior to the procedure, a History and Physical was                         performed, and patient medications and allergies were                         reviewed. The patient is competent. The risks and                         benefits of the procedure and the sedation options and                         risks were discussed with the patient. All questions                         were answered and informed consent was obtained.                         Patient identification and proposed procedure were                         verified by the physician, the nurse, the                         anesthesiologist, the anesthetist and the technician                         in the endoscopy suite. Mental Status Examination:                         alert and oriented. Airway Examination: normal                         oropharyngeal airway and neck mobility. Respiratory                         Examination: clear to auscultation. CV Examination:  normal. Prophylactic Antibiotics: The patient does not                         require prophylactic antibiotics. Prior                         Anticoagulants: The patient has taken no anticoagulant                          or antiplatelet agents. ASA Grade Assessment: II - A                         patient with mild systemic disease. After reviewing                         the risks and benefits, the patient was deemed in                         satisfactory condition to undergo the procedure. The                         anesthesia plan was to use monitored anesthesia care                         (MAC). Immediately prior to administration of                         medications, the patient was re-assessed for adequacy                         to receive sedatives. The heart rate, respiratory                         rate, oxygen saturations, blood pressure, adequacy of                         pulmonary ventilation, and response to care were                         monitored throughout the procedure. The physical                         status of the patient was re-assessed after the                         procedure.                        After obtaining informed consent, the endoscope was                         passed under direct vision. Throughout the procedure,                         the patient's blood pressure, pulse, and oxygen                         saturations were monitored continuously. The Endoscope  was introduced through the mouth, and advanced to the                         second part of duodenum. The upper GI endoscopy was                         accomplished without difficulty. The patient tolerated                         the procedure well. Findings:      The examined esophagus was normal.      Patchy moderate mucosal changes characterized by nodularity and altered       texture were found in the gastric body, at the incisura and in the       gastric antrum. Biopsies were taken with a cold forceps for histology.       Two seperate jars were used. An antral jar and body jar. Multiple passes       were placed in each jar. Estimated blood loss  was minimal.      Localized mild mucosal changes characterized by plaques were found in       the gastric body. Preparations were made for mucosal resection. Snare       mucosal resection was performed. Resection and retrieval were complete.       Estimated blood loss was minimal.      The examined duodenum was normal. Impression:            - Normal esophagus.                        - Nodular and texture changed mucosa in the gastric                         body, incisura and antrum. Biopsied.                        - Plaques mucosa in the gastric body.                        - Normal examined duodenum.                        - Mucosal resection was performed. Resection and                         retrieval were complete. Recommendation:        - Discharge patient to home.                        - Resume previous diet.                        - Continue present medications.                        - Await pathology results.                        - Return to referring physician as previously  scheduled. Procedure Code(s):     --- Professional ---                        [phone], Esophagogastroduodenoscopy, flexible,                         transoral; with endoscopic mucosal resection Diagnosis Code(s):     --- Professional ---                        K31.89, Other diseases of stomach and duodenum CPT copyright 2022 [employer]. All rights reserved. The codes documented in this report are preliminary and upon coder review may  be revised to meet current compliance requirements. Eather Colas MD, MD 08/07/2023 9:13:20 AM Number of Addenda: 0 Note Initiated On: 08/07/2023 8:38 AM Estimated Blood Loss:  Estimated blood loss was minimal.      [facility]

## 2023-08-10 ENCOUNTER — Encounter: Payer: Self-pay | Admitting: Gastroenterology

## 2023-08-11 ENCOUNTER — Encounter: Payer: Self-pay | Admitting: Family Medicine

## 2023-08-11 DIAGNOSIS — K2950 Unspecified chronic gastritis without bleeding: Secondary | ICD-10-CM | POA: Insufficient documentation

## 2023-08-11 LAB — SURGICAL PATHOLOGY

## 2023-08-20 NOTE — Progress Notes (Signed)
Name: [name] [name]   MRN: [id number]    DOB: 09/09/1946   Date:08/21/2023       Progress Note  Subjective  Chief Complaint  Follow Up  HPI  Incomplete Void and possible vaginal prolapse: she is s/p hysterectomy. She was seen by Dr. Lonna Cobb and referred to gyn but wants to hold off until next year to be seen.   Neutropenia: we will continue to monitor, chronic   Mild carotid artery disease: she is on Atorvastatin 40 mg , we will switch to Rosuvastatin 40 mg to get LDL below 70   Osteopenia after menopause: last vitamin D was at goal, she is physically active  FRAX* RESULTS:  (version: 3.5) 10-year Probability of Fracture1 Major Osteoporotic Fracture2 Hip Fracture 5.2% 1.0%  HTN and PVC: controlled with current medications of norvasc and metoprolol, no side effects. No chest pain, SOB or palpitation   Paresthesia: of both hands ( 1st through 4 th fingers) intermittent and mild at this time, not sure what trigger symptoms but starting to bother her. Discussed wrist brace. She does not want to get evaluated for it   Corn on toe: seen by podiatrist, 3rd toe, used topical medication and is doing better   Patient Active Problem List   Diagnosis Date Noted   Chronic gastritis 08/11/2023   Calculus of gallbladder without cholecystitis without obstruction 03/26/2023   Other neutropenia (HCC) 03/26/2023   History of Helicobacter pylori infection 03/26/2023   Non-recurrent bilateral inguinal hernia without obstruction or gangrene    Mild carotid artery disease (HCC) 06/02/2019   History of adenomatous polyp of colon 06/30/2018   Osteopenia after menopause 02/20/2017   Acid reflux 02/20/2016   Hypercholesteremia 02/20/2016   Primary hypertension 02/20/2016   Irregular cardiac rhythm 02/20/2016   Calculus of kidney 02/20/2016   Atrophy of thyroid 02/20/2016   Neck pain on left side 02/06/2014   Asymptomatic PVCs 02/06/2014    Past Surgical History:  Procedure Laterality Date    ABDOMINAL HYSTERECTOMY  2007   total   BIOPSY  08/07/2023   Procedure: BIOPSY;  Surgeon: Regis Bill, MD;  Location: [facility];  Service: Endoscopy;;   BREAST BIOPSY Left 2007   Dr. Rutherford Nail office-benign   COLONOSCOPY  11/12/2001   multiple sessile polyps found in rectum, 5mm in size, distributed diffusely, biopsies were taken. The polyps extended from lower rectum to approximately 20 cm. They were confluent in the area between 10-15cm. Small polyps with central ulceration were noted.   COLONOSCOPY WITH PROPOFOL N/A 08/04/2018   Procedure: COLONOSCOPY WITH PROPOFOL;  Surgeon: Earline Mayotte, MD;  Location: [facility];  Service: Endoscopy;  Laterality: N/A;   COLONOSCOPY WITH PROPOFOL N/A 06/04/2022   Procedure: COLONOSCOPY WITH PROPOFOL;  Surgeon: Earline Mayotte, MD;  Location: [facility];  Service: Endoscopy;  Laterality: N/A;   ESOPHAGOGASTRODUODENOSCOPY (EGD) WITH PROPOFOL N/A 06/04/2022   Procedure: ESOPHAGOGASTRODUODENOSCOPY (EGD) WITH PROPOFOL;  Surgeon: Earline Mayotte, MD;  Location: [facility];  Service: Endoscopy;  Laterality: N/A;  with polypectomy   ESOPHAGOGASTRODUODENOSCOPY (EGD) WITH PROPOFOL N/A 07/30/2022   Procedure: ESOPHAGOGASTRODUODENOSCOPY (EGD) WITH PROPOFOL;  Surgeon: Earline Mayotte, MD;  Location: [facility];  Service: Endoscopy;  Laterality: N/A;   ESOPHAGOGASTRODUODENOSCOPY (EGD) WITH PROPOFOL N/A 08/07/2023   Procedure: ESOPHAGOGASTRODUODENOSCOPY (EGD) WITH PROPOFOL;  Surgeon: Regis Bill, MD;  Location: [facility];  Service: Endoscopy;  Laterality: N/A;   HERNIA REPAIR     TUBAL LIGATION     UMBILICAL HERNIA REPAIR N/A  10/01/2021   Procedure: HERNIA REPAIR UMBILICAL ADULT, open;  Surgeon: Henrene Dodge, MD;  Location: [facility];  Service: General;  Laterality: N/A;    Family History  Problem Relation Age of Onset   Hypertension Mother    Hyperlipidemia Mother    Other Mother        enlarged heart   Heart  disease Father 60   Diabetes Father    Other Sister    Kidney failure Sister    Heart disease Sister    Hypertension Sister    Diabetes Sister    Heart disease Brother    Hypertension Brother    Kidney failure Brother    Hypertension Brother    Kidney failure Brother    Diabetes Brother    Cancer Brother    Colon cancer Brother    Diabetes Brother    Coronary artery disease Other        family hx   Hyperlipidemia Son    Hypertension Son    Hypertension Son    Hyperlipidemia Son    Breast cancer Neg Hx     Social History   Tobacco Use   Smoking status: Never   Smokeless tobacco: Never   Tobacco comments:    tobacco use - no  Substance Use Topics   Alcohol use: Never     Current Outpatient Medications:    acetaminophen (TYLENOL) 500 MG tablet, Take 2 tablets (1,000 mg total) by mouth every 6 (six) hours as needed for mild pain., Disp: , Rfl:    aspirin EC 81 MG tablet, Take by mouth., Disp: , Rfl:    Cholecalciferol (VITAMIN D3 PO), Take 5,000 Units by mouth in the morning., Disp: , Rfl:    Multiple Vitamin (MULTIVITAMIN WITH MINERALS) TABS tablet, Take 1 tablet by mouth in the morning. Centrum for Women, Disp: , Rfl:    nystatin ointment (MYCOSTATIN), Apply 1 Application topically 2 (two) times daily., Disp: 30 g, Rfl: 1   rosuvastatin (CRESTOR) 40 MG tablet, Take 1 tablet (40 mg total) by mouth daily. In place of Atorvastatin, Disp: 90 tablet, Rfl: 3   zinc gluconate 50 MG tablet, Take 50 mg by mouth daily., Disp: , Rfl:    amLODipine (NORVASC) 2.5 MG tablet, Take 1 tablet (2.5 mg total) by mouth daily., Disp: 90 tablet, Rfl: 3   metoprolol succinate (TOPROL-XL) 25 MG 24 hr tablet, Take 1 tablet (25 mg total) by mouth daily., Disp: 90 tablet, Rfl: 0  No Known Allergies  I personally reviewed active problem list, medication list, allergies, family history, social history, health maintenance with the patient/caregiver today.   ROS  Ten systems reviewed and is  negative except as mentioned in HPI    Objective  Vitals:   08/21/23 0821  BP: 126/80  Pulse: 84  Resp: 14  Temp: 98 F (36.7 C)  TempSrc: Oral  SpO2: 97%  Weight: 190 lb 6.4 oz (86.4 kg)  Height: 5' 5.5" (1.664 m)    Body mass index is 31.2 kg/m.  Physical Exam  Constitutional: Patient appears well-developed and well-nourished. Obese  No distress.  HEENT: head atraumatic, normocephalic, pupils equal and reactive to light, neck supple Cardiovascular: Normal rate, regular rhythm and normal heart sounds.  No murmur heard. No BLE edema. Pulmonary/Chest: Effort normal and breath sounds normal. No respiratory distress. Abdominal: Soft.  There is no tenderness. Psychiatric: Patient has a normal mood and affect. behavior is normal. Judgment and thought content normal.    PHQ2/9:  08/21/2023    8:39 AM 03/26/2023    1:05 PM 07/07/2022    9:37 AM 06/28/2021    9:17 AM 03/20/2020   10:18 AM  Depression screen PHQ 2/9  Decreased Interest 0 0 0 0 0  Down, Depressed, Hopeless 1 0 0 0 0  PHQ - 2 Score 1 0 0 0 0  Altered sleeping 0 0 3 1   Tired, decreased energy 0 0 0 0   Change in appetite 0 0 0 0   Feeling bad or failure about yourself  0 0 0 0   Trouble concentrating 0 0 0 0   Moving slowly or fidgety/restless 0 0 0 0   Suicidal thoughts 0 0 0 0   PHQ-9 Score 1 0 3 1   Difficult doing work/chores Somewhat difficult Not difficult at all Not difficult at all Not difficult at all     phq 9 is negative   Fall Risk:    08/21/2023    8:23 AM 03/26/2023    1:04 PM 07/07/2022    9:37 AM 09/20/2021    9:12 AM 06/28/2021    9:17 AM  Fall Risk   Falls in the past year? 0 0 0 0 0  Number falls in past yr:  0 0  0  Injury with Fall?  0 0  0  Risk for fall due to : No Fall Risks No Fall Risks No Fall Risks  No Fall Risks  Follow up Falls prevention discussed Falls prevention discussed;Education provided;Falls evaluation completed Falls evaluation completed  Falls evaluation  completed     Assessment & Plan  1. Other neutropenia (HCC)  We will continue to monitor   2. Mild carotid artery disease (HCC)  - rosuvastatin (CRESTOR) 40 MG tablet; Take 1 tablet (40 mg total) by mouth daily. In place of Atorvastatin  Dispense: 90 tablet; Refill: 3  3. Chronic gastritis without bleeding, unspecified gastritis type  Up to date with EGD   4. Need for immunization against influenza  - Flu Vaccine Trivalent High Dose (Fluad)  5. Osteopenia after menopause  Low FRAX score  6. Hypercholesteremia  - rosuvastatin (CRESTOR) 40 MG tablet; Take 1 tablet (40 mg total) by mouth daily. In place of Atorvastatin  Dispense: 90 tablet; Refill: 3  7. Primary hypertension  - metoprolol succinate (TOPROL-XL) 25 MG 24 hr tablet; Take 1 tablet (25 mg total) by mouth daily.  Dispense: 90 tablet; Refill: 0 - amLODipine (NORVASC) 2.5 MG tablet; Take 1 tablet (2.5 mg total) by mouth daily.  Dispense: 90 tablet; Refill: 3

## 2023-08-21 ENCOUNTER — Ambulatory Visit (INDEPENDENT_AMBULATORY_CARE_PROVIDER_SITE_OTHER): Payer: Medicare HMO | Admitting: Family Medicine

## 2023-08-21 ENCOUNTER — Ambulatory Visit: Payer: Medicare HMO

## 2023-08-21 ENCOUNTER — Encounter: Payer: Self-pay | Admitting: Family Medicine

## 2023-08-21 VITALS — BP 126/80 | HR 84 | Temp 98.0°F | Resp 14 | Ht 65.5 in | Wt 190.4 lb

## 2023-08-21 DIAGNOSIS — E7800 Pure hypercholesterolemia, unspecified: Secondary | ICD-10-CM

## 2023-08-21 DIAGNOSIS — I10 Essential (primary) hypertension: Secondary | ICD-10-CM

## 2023-08-21 DIAGNOSIS — D708 Other neutropenia: Secondary | ICD-10-CM

## 2023-08-21 DIAGNOSIS — K2950 Unspecified chronic gastritis without bleeding: Secondary | ICD-10-CM | POA: Diagnosis not present

## 2023-08-21 DIAGNOSIS — M8580 Other specified disorders of bone density and structure, unspecified site: Secondary | ICD-10-CM

## 2023-08-21 DIAGNOSIS — Z780 Asymptomatic menopausal state: Secondary | ICD-10-CM

## 2023-08-21 DIAGNOSIS — I779 Disorder of arteries and arterioles, unspecified: Secondary | ICD-10-CM

## 2023-08-21 DIAGNOSIS — Z23 Encounter for immunization: Secondary | ICD-10-CM | POA: Diagnosis not present

## 2023-08-21 MED ORDER — METOPROLOL SUCCINATE ER 25 MG PO TB24: 25.0000 mg | ORAL_TABLET | Freq: Every day | ORAL | 0 refills | Status: DC

## 2023-08-21 MED ORDER — AMLODIPINE BESYLATE 2.5 MG PO TABS: 2.5000 mg | ORAL_TABLET | Freq: Every day | ORAL | 3 refills | Status: DC

## 2023-08-21 MED ORDER — ROSUVASTATIN CALCIUM 40 MG PO TABS: 40.0000 mg | ORAL_TABLET | Freq: Every day | ORAL | 3 refills | Status: DC

## 2023-09-17 ENCOUNTER — Ambulatory Visit: Payer: Medicare HMO

## 2023-09-17 DIAGNOSIS — Z0000 Encounter for general adult medical examination without abnormal findings: Secondary | ICD-10-CM

## 2023-09-17 NOTE — Progress Notes (Signed)
Subjective:   [name] [name] is a 77 y.o. female who presents for Medicare Annual (Subsequent) preventive examination.  Visit Complete: Virtual I connected with  Merry Proud on 09/17/23 by a audio enabled telemedicine application and verified that I am speaking with the correct person using two identifiers.  Patient Location: Home  Provider Location: Office/Clinic  I discussed the limitations of evaluation and management by telemedicine. The patient expressed understanding and agreed to proceed.  Vital Signs: Because this visit was a virtual/telehealth visit, some criteria may be missing or patient reported. Any vitals not documented were not able to be obtained and vitals that have been documented are patient reported.  Cardiac Risk Factors include: advanced age (>46men, >85 women);hypertension;sedentary lifestyle     Objective:    Today's Vitals   09/17/23 1128  PainSc: 0-No pain   There is no height or weight on file to calculate BMI.     09/17/2023   11:32 AM 08/07/2023    7:25 AM 07/30/2022    7:49 AM 06/04/2022    8:16 AM 10/01/2021    6:29 AM 09/17/2021    3:27 PM 03/20/2020   10:23 AM  Advanced Directives  Does Patient Have a Medical Advance Directive? No No No No No No No  Would patient like information on creating a medical advance directive? No - Patient declined     No - Patient declined No - Patient declined    Current Medications (verified) Outpatient Encounter Medications as of 09/17/2023  Medication Sig   acetaminophen (TYLENOL) 500 MG tablet Take 2 tablets (1,000 mg total) by mouth every 6 (six) hours as needed for mild pain.   amLODipine (NORVASC) 2.5 MG tablet Take 1 tablet (2.5 mg total) by mouth daily.   aspirin EC 81 MG tablet Take by mouth.   Cholecalciferol (VITAMIN D3 PO) Take 5,000 Units by mouth in the morning.   metoprolol succinate (TOPROL-XL) 25 MG 24 hr tablet Take 1 tablet (25 mg total) by mouth daily.   Multiple Vitamin (MULTIVITAMIN WITH  MINERALS) TABS tablet Take 1 tablet by mouth in the morning. Centrum for Women   nystatin ointment (MYCOSTATIN) Apply 1 Application topically 2 (two) times daily.   rosuvastatin (CRESTOR) 40 MG tablet Take 1 tablet (40 mg total) by mouth daily. In place of Atorvastatin   zinc gluconate 50 MG tablet Take 50 mg by mouth daily.   No facility-administered encounter medications on file as of 09/17/2023.    Allergies (verified) Patient has no known allergies.   History: Past Medical History:  Diagnosis Date   Chest pain, unspecified    Colitis 02/20/2016   H. pylori duodenitis    persistent fall 2023 treated by Dr. Lemar Livings   H/O left breast biopsy 12/11/2005   [facility], path report matched the clinical impression of left breast lipoma   HLD (hyperlipidemia)    mixed   HTN (hypertension)    Kidney stones 03/2022   Past Surgical History:  Procedure Laterality Date   ABDOMINAL HYSTERECTOMY  2007   total   BIOPSY  08/07/2023   Procedure: BIOPSY;  Surgeon: Regis Bill, MD;  Location: [facility];  Service: Endoscopy;;   BREAST BIOPSY Left 2007   Dr. Rutherford Nail office-benign   COLONOSCOPY  11/12/2001   multiple sessile polyps found in rectum, 5mm in size, distributed diffusely, biopsies were taken. The polyps extended from lower rectum to approximately 20 cm. They were confluent in the area between 10-15cm. Small polyps with central ulceration  were noted.   COLONOSCOPY WITH PROPOFOL N/A 08/04/2018   Procedure: COLONOSCOPY WITH PROPOFOL;  Surgeon: Earline Mayotte, MD;  Location: [facility];  Service: Endoscopy;  Laterality: N/A;   COLONOSCOPY WITH PROPOFOL N/A 06/04/2022   Procedure: COLONOSCOPY WITH PROPOFOL;  Surgeon: Earline Mayotte, MD;  Location: [facility];  Service: Endoscopy;  Laterality: N/A;   ESOPHAGOGASTRODUODENOSCOPY (EGD) WITH PROPOFOL N/A 06/04/2022   Procedure: ESOPHAGOGASTRODUODENOSCOPY (EGD) WITH PROPOFOL;  Surgeon: Earline Mayotte, MD;  Location:  [facility];  Service: Endoscopy;  Laterality: N/A;  with polypectomy   ESOPHAGOGASTRODUODENOSCOPY (EGD) WITH PROPOFOL N/A 07/30/2022   Procedure: ESOPHAGOGASTRODUODENOSCOPY (EGD) WITH PROPOFOL;  Surgeon: Earline Mayotte, MD;  Location: [facility];  Service: Endoscopy;  Laterality: N/A;   ESOPHAGOGASTRODUODENOSCOPY (EGD) WITH PROPOFOL N/A 08/07/2023   Procedure: ESOPHAGOGASTRODUODENOSCOPY (EGD) WITH PROPOFOL;  Surgeon: Regis Bill, MD;  Location: [facility];  Service: Endoscopy;  Laterality: N/A;   HERNIA REPAIR     TUBAL LIGATION     UMBILICAL HERNIA REPAIR N/A 10/01/2021   Procedure: HERNIA REPAIR UMBILICAL ADULT, open;  Surgeon: Henrene Dodge, MD;  Location: [facility];  Service: General;  Laterality: N/A;   Family History  Problem Relation Age of Onset   Hypertension Mother    Hyperlipidemia Mother    Other Mother        enlarged heart   Heart disease Father 85   Diabetes Father    Other Sister    Kidney failure Sister    Heart disease Sister    Hypertension Sister    Diabetes Sister    Heart disease Brother    Hypertension Brother    Kidney failure Brother    Hypertension Brother    Kidney failure Brother    Diabetes Brother    Cancer Brother    Colon cancer Brother    Diabetes Brother    Coronary artery disease Other        family hx   Hyperlipidemia Son    Hypertension Son    Hypertension Son    Hyperlipidemia Son    Breast cancer Neg Hx    Social History   Socioeconomic History   Marital status: Married    Spouse name: Molly Maduro    Number of children: 5   Years of education: HS   Highest education level: 12th grade  Occupational History   Occupation: retired  Tobacco Use   Smoking status: Never   Smokeless tobacco: Never   Tobacco comments:    tobacco use - no  Vaping Use   Vaping status: Never Used  Substance and Sexual Activity   Alcohol use: Never   Drug use: No   Sexual activity: Not Currently  Other Topics Concern   Not on  file  Social History Narrative   Married, full time, exercise - walks some.   Social Drivers of Corporate investment banker Strain: Low Risk  (09/17/2023)   Overall Financial Resource Strain (CARDIA)    Difficulty of Paying Living Expenses: Not hard at all  Food Insecurity: No Food Insecurity (09/17/2023)   Hunger Vital Sign    Worried About Running Out of Food in the Last Year: Never true    Ran Out of Food in the Last Year: Never true  Transportation Needs: No Transportation Needs (09/17/2023)   PRAPARE - Administrator, Civil Service (Medical): No    Lack of Transportation (Non-Medical): No  Physical Activity: Insufficiently Active (09/17/2023)   Exercise Vital Sign  Days of Exercise per Week: 7 days    Minutes of Exercise per Session: 20 min  Stress: No Stress Concern Present (09/17/2023)   [employer] of Occupational Health - Occupational Stress Questionnaire    Feeling of Stress : Only a little  Social Connections: Moderately Integrated (09/17/2023)   Social Connection and Isolation Panel [NHANES]    Frequency of Communication with Friends and Family: More than three times a week    Frequency of Social Gatherings with Friends and Family: More than three times a week    Attends Religious Services: More than 4 times per year    Active Member of [employer] or Organizations: No    Attends Engineer, structural: Never    Marital Status: Married    Tobacco Counseling Counseling given: Not Answered Tobacco comments: tobacco use - no   Clinical Intake:  Pre-visit preparation completed: Yes  Pain : No/denies pain Pain Score: 0-No pain     BMI - recorded: 31.1 Nutritional Status: BMI > 30  Obese Nutritional Risks: None Diabetes: No  How often do you need to have someone help you when you read instructions, pamphlets, or other written materials from your doctor or pharmacy?: 1 - Never  Interpreter Needed?: No  Information entered by :: Kennedy Bucker, LPN   Activities of Daily Living    09/17/2023   11:33 AM 08/21/2023    8:23 AM  In your present state of health, do you have any difficulty performing the following activities:  Hearing? 0 0  Vision? 0 0  Difficulty concentrating or making decisions? 0 0  Walking or climbing stairs? 0 0  Dressing or bathing? 0 0  Doing errands, shopping? 0 0  Preparing Food and eating ? N   Using the Toilet? N   In the past six months, have you accidently leaked urine? N   Do you have problems with loss of bowel control? N   Managing your Medications? N   Managing your Finances? N   Housekeeping or managing your Housekeeping? N     Patient Care Team: Alba Cory, MD as PCP - General (Family Medicine) [place], [facility] as Consulting Physician [place], Merrily Pew, MD as Consulting Physician (General Surgery)  Indicate any recent Medical Services you may have received from other than [facility] providers in the past year (date may be approximate).     Assessment:   This is a routine wellness examination for [place].  Hearing/Vision screen Hearing Screening - Comments:: NO AIDS Vision Screening - Comments:: WEARS GLASSES- [facility]    Goals Addressed             This Visit's Progress    DIET - EAT MORE FRUITS AND VEGETABLES         Depression Screen    09/17/2023   11:31 AM 08/21/2023    8:39 AM 03/26/2023    1:05 PM 07/07/2022    9:37 AM 06/28/2021    9:17 AM 03/20/2020   10:18 AM 02/22/2019   10:04 AM  PHQ 2/9 Scores  PHQ - 2 Score 0 1 0 0 0 0 0  PHQ- 9 Score 0 1 0 3 1      Fall Risk    09/17/2023   11:33 AM 08/21/2023    8:23 AM 03/26/2023    1:04 PM 07/07/2022    9:37 AM 09/20/2021    9:12 AM  Fall Risk   Falls in the past year? 0 0 0 0  0  Number falls in past yr: 0  0 0   Injury with Fall? 0  0 0   Risk for fall due to : No Fall Risks No Fall Risks No Fall Risks No Fall Risks   Follow up Falls prevention discussed;Falls evaluation completed Falls  prevention discussed Falls prevention discussed;Education provided;Falls evaluation completed Falls evaluation completed     MEDICARE RISK AT HOME: Medicare Risk at Home Any stairs in or around the home?: No If so, are there any without handrails?: No Home free of loose throw rugs in walkways, pet beds, electrical cords, etc?: Yes Adequate lighting in your home to reduce risk of falls?: Yes Life alert?: No Use of a cane, walker or w/c?: No Grab bars in the bathroom?: No Shower chair or bench in shower?: No Elevated toilet seat or a handicapped toilet?: Yes  TIMED UP AND GO:  Was the test performed?  No    Cognitive Function:        09/17/2023   11:34 AM 06/28/2021    9:14 AM 03/20/2020   10:25 AM 02/22/2019   10:07 AM 02/16/2018   10:06 AM  6CIT Screen  What Year? 0 points 0 points 0 points 0 points 0 points  What month? 0 points 0 points 0 points 0 points 0 points  What time? 0 points 0 points 0 points 0 points 0 points  Count back from 20 0 points 0 points 0 points 0 points 0 points  Months in reverse 0 points 0 points 0 points 0 points 0 points  Repeat phrase 0 points 6 points 4 points 0 points 2 points  Total Score 0 points 6 points 4 points 0 points 2 points    Immunizations Immunization History  Administered Date(s) Administered   Fluad Quad(high Dose 65+) 06/26/2020, 06/28/2021   Fluad Trivalent(High Dose 65+) 08/21/2023   Influenza, High Dose Seasonal PF 08/05/2014, 07/15/2017, 07/31/2018, 07/13/2019, 06/30/2022   PFIZER(Purple Top)SARS-COV-2 Vaccination 11/10/2019, 12/01/2019   Pneumococcal Conjugate-13 12/23/2013   Pneumococcal Polysaccharide-23 12/16/2010   Td 10/06/2007    TDAP status: Due, Education has been provided regarding the importance of this vaccine. Advised may receive this vaccine at local pharmacy or Health Dept. Aware to provide a copy of the vaccination record if obtained from local pharmacy or Health Dept. Verbalized acceptance and  understanding.  Flu Vaccine status: Up to date  Pneumococcal vaccine status: Due, Education has been provided regarding the importance of this vaccine. Advised may receive this vaccine at local pharmacy or Health Dept. Aware to provide a copy of the vaccination record if obtained from local pharmacy or Health Dept. Verbalized acceptance and understanding.  Covid-19 vaccine status: Completed vaccines  Qualifies for Shingles Vaccine? No   Zostavax completed No   Shingrix Completed?: No.    Education has been provided regarding the importance of this vaccine. Patient has been advised to call insurance company to determine out of pocket expense if they have not yet received this vaccine. Advised may also receive vaccine at local pharmacy or Health Dept. Verbalized acceptance and understanding.  Screening Tests Health Maintenance  Topic Date Due   COVID-19 Vaccine (3 - Pfizer risk series) 12/29/2019   Zoster Vaccines- Shingrix (1 of 2) 11/20/2023 (Originally 04/09/1965)   DTaP/Tdap/Td (2 - Tdap) 08/19/2024 (Originally 10/05/2017)   Medicare Annual Wellness (AWV)  09/16/2024   DEXA SCAN  06/03/2026   Colonoscopy  06/05/2027   Pneumonia Vaccine 58+ Years old  Completed   INFLUENZA VACCINE  Completed  Hepatitis C Screening  Completed   HPV VACCINES  Aged Out    Health Maintenance  Health Maintenance Due  Topic Date Due   COVID-19 Vaccine (3 - Pfizer risk series) 12/29/2019    Colorectal cancer screening: Type of screening: Colonoscopy. Completed 06/04/22. Repeat every 5 years  Mammogram status: Completed THIS YEAR @ [facility] 06/04/23. Repeat every year  Bone Density status: Completed 06/04/23. Results reflect: Bone density results: OSTEOPENIA. Repeat every 3-5 years.  Lung Cancer Screening: (Low Dose CT Chest recommended if Age 79-80 years, 20 pack-year currently smoking OR have quit w/in 15years.) does not qualify.    Additional Screening:  Hepatitis C Screening: does qualify; Completed  02/20/17  Vision Screening: Recommended annual ophthalmology exams for early detection of glaucoma and other disorders of the eye. Is the patient up to date with their annual eye exam?  Yes  Who is the provider or what is the name of the office in which the patient attends annual eye exams? [facility] If pt is not established with a provider, would they like to be referred to a provider to establish care? No .   Dental Screening: Recommended annual dental exams for proper oral hygiene   Community Resource Referral / Chronic Care Management: CRR required this visit?  No   CCM required this visit?  No     Plan:     I have personally reviewed and noted the following in the patient's chart:   Medical and social history Use of alcohol, tobacco or illicit drugs  Current medications and supplements including opioid prescriptions. Patient is not currently taking opioid prescriptions. Functional ability and status Nutritional status Physical activity Advanced directives List of other physicians Hospitalizations, surgeries, and ER visits in previous 12 months Vitals Screenings to include cognitive, depression, and falls Referrals and appointments  In addition, I have reviewed and discussed with patient certain preventive protocols, quality metrics, and best practice recommendations. A written personalized care plan for preventive services as well as general preventive health recommendations were provided to patient.     Hal Hope, LPN   29/52/[id number]   After Visit Summary: (MyChart) Due to this being a telephonic visit, the after visit summary with patients personalized plan was offered to patient via MyChart   Nurse Notes: NONE

## 2023-09-17 NOTE — Patient Instructions (Addendum)
[name] [name] , Thank you for taking time to come for your Medicare Wellness Visit. I appreciate your ongoing commitment to your health goals. Please review the following plan we discussed and let me know if I can assist you in the future.   Referrals/Orders/Follow-Ups/Clinician Recommendations: NONE  This is a list of the screening recommended for you and due dates:  Health Maintenance  Topic Date Due   COVID-19 Vaccine (3 - Pfizer risk series) 12/29/2019   Zoster (Shingles) Vaccine (1 of 2) 11/20/2023*   DTaP/Tdap/Td vaccine (2 - Tdap) 08/19/2024*   Medicare Annual Wellness Visit  09/16/2024   DEXA scan (bone density measurement)  06/03/2026   Colon Cancer Screening  06/05/2027   Pneumonia Vaccine  Completed   Flu Shot  Completed   Hepatitis C Screening  Completed   HPV Vaccine  Aged Out  *Topic was postponed. The date shown is not the original due date.    Advanced directives: (ACP Link)Information on Advanced Care Planning can be found at [facility] Advance Health Care Directives Advance Health Care Directives ([email or web address]   Next Medicare Annual Wellness Visit scheduled for next year: Yes    10/06/24 @ 10:50 AM IN PERSON

## 2023-10-09 ENCOUNTER — Other Ambulatory Visit: Payer: Self-pay | Admitting: Family Medicine

## 2023-10-09 DIAGNOSIS — I10 Essential (primary) hypertension: Secondary | ICD-10-CM

## 2023-10-09 NOTE — Telephone Encounter (Signed)
 Medication Refill -  Most Recent Primary Care Visit:  Provider: GWENN JHONNIE RAMAN  Department: [facility]  Visit Type: MEDICARE WELL VISIT  Date: 09/17/2023  Medication: metoprolol  succinate (TOPROL -XL) 25 MG 24 hr tablet   Has the patient contacted their pharmacy? Yes (Agent: If no, request that the patient contact the pharmacy for the refill. If patient does not wish to contact the pharmacy document the reason why and proceed with request.) (Agent: If yes, when and what did the pharmacy advise?)  Is this the correct pharmacy for this prescription? Yes.  If no, delete pharmacy and type the correct one.  This is the patient's preferred pharmacy:   [facility] - [place], [place] - [address] [place] [address] Phone: [phone] Fax: [phone]   Has the prescription been filled recently? Yes  Is the patient out of the medication? Yes  Has the patient been seen for an appointment in the last year OR does the patient have an upcoming appointment? Yes  Can we respond through MyChart? Yes  Agent: Please be advised that Rx refills may take up to 3 business days. We ask that you follow-up with your pharmacy.

## 2023-10-12 NOTE — Telephone Encounter (Signed)
 Refill requested too soon, last ordered 08/21/23 #90 tablets  Requested Prescriptions  Pending Prescriptions Disp Refills   metoprolol  succinate (TOPROL -XL) 25 MG 24 hr tablet 90 tablet 0    Sig: Take 1 tablet (25 mg total) by mouth daily.     Cardiovascular:  Beta Blockers Passed - 10/12/2023  2:53 PM      Passed - Last BP in normal range    BP Readings from Last 1 Encounters:  08/21/23 126/80         Passed - Last Heart Rate in normal range    Pulse Readings from Last 1 Encounters:  08/21/23 84         Passed - Valid encounter within last 6 months    Recent Outpatient Visits           1 month ago Other neutropenia [facility])   [place] [facility] Glenard Mire, MD   6 months ago Encounter for medical examination to establish care   [facility] Glenard Mire, MD   1 year ago Intertrigo   [facility] Nancyann BRAVO, MD   1 year ago Annual physical exam   [facility] Gasper Nancyann BRAVO, MD   2 years ago Annual physical exam   [facility] Gasper Nancyann BRAVO, MD       Future Appointments             In 4 months Glenard, Krichna, MD [facility], [facility]

## 2023-11-30 ENCOUNTER — Telehealth: Payer: Self-pay

## 2023-11-30 NOTE — Telephone Encounter (Signed)
 Pt will need refills by April on Rosuvastatin 40 mg and metoprolol Er to sent to be to [facility] . She has new insurance and needs to go to him.

## 2023-11-30 NOTE — Telephone Encounter (Signed)
 Will fill when patient needs her medication filled. [facility] have [facility].

## 2023-12-21 ENCOUNTER — Other Ambulatory Visit: Payer: Self-pay | Admitting: Family Medicine

## 2023-12-21 DIAGNOSIS — I779 Disorder of arteries and arterioles, unspecified: Secondary | ICD-10-CM

## 2023-12-21 DIAGNOSIS — E7800 Pure hypercholesterolemia, unspecified: Secondary | ICD-10-CM

## 2023-12-21 NOTE — Telephone Encounter (Signed)
 Copied from CRM [id number]. Topic: Clinical - Medication Refill >> Dec 21, 2023 10:32 AM Izetta Dakin wrote: Most Recent Primary Care Visit:  Provider: Hal Hope  Department: [facility]  Visit Type: MEDICARE WELL VISIT  Date: 09/17/2023  Medication: rosuvastatin (CRESTOR) 40 MG tablet  Has the patient contacted their pharmacy? Yes (Agent: If no, request that the patient contact the pharmacy for the refill. If patient does not wish to contact the pharmacy document the reason why and proceed with request.) (Agent: If yes, when and what did the pharmacy advise?)  Is this the correct pharmacy for this prescription? Yes If no, delete pharmacy and type the correct one.  This is the patient's preferred pharmacy:    [facility]) - [place], [place] - [address] [facility] [address] [place] [address] [place] [address] Phone: [phone] Fax: [phone]   Has the prescription been filled recently? No  Is the patient out of the medication? Yes  Has the patient been seen for an appointment in the last year OR does the patient have an upcoming appointment? Yes  Can we respond through MyChart? No  Agent: Please be advised that Rx refills may take up to 3 business days. We ask that you follow-up with your pharmacy.

## 2023-12-22 MED ORDER — ROSUVASTATIN CALCIUM 40 MG PO TABS: 40.0000 mg | ORAL_TABLET | Freq: Every day | ORAL | 0 refills | Status: DC

## 2023-12-22 NOTE — Telephone Encounter (Signed)
 Requested Prescriptions  Pending Prescriptions Disp Refills   rosuvastatin (CRESTOR) 40 MG tablet 90 tablet 0    Sig: Take 1 tablet (40 mg total) by mouth daily. In place of Atorvastatin     Cardiovascular:  Antilipid - Statins 2 Failed - 12/22/2023  3:52 PM      Failed - Lipid Panel in normal range within the last 12 months    Cholesterol, Total  Date Value Ref Range Status  07/07/2022 197 100 - 199 mg/dL Final   Cholesterol  Date Value Ref Range Status  03/27/2023 199 <200 mg/dL Final   LDL Cholesterol (Calc)  Date Value Ref Range Status  03/27/2023 107 (H) mg/dL (calc) Final    Comment:    Reference range: <100 . Desirable range <100 mg/dL for primary prevention;   <70 mg/dL for patients with CHD or diabetic patients  with > or = 2 CHD risk factors. Marland Kitchen LDL-C is now calculated using the Martin-Hopkins  calculation, which is a validated novel method providing  better accuracy than the Friedewald equation in the  estimation of LDL-C.  Horald Pollen et al. Lenox Ahr. 1610;960(45): 2061-2068  (http://education.[facility].com/faq/FAQ164)    HDL  Date Value Ref Range Status  03/27/2023 77 > OR = 50 mg/dL Final  40/98/1191 72 >47 mg/dL Final   Triglycerides  Date Value Ref Range Status  03/27/2023 67 <150 mg/dL Final         Passed - Cr in normal range and within 360 days    Creat  Date Value Ref Range Status  03/27/2023 0.70 0.60 - 1.00 mg/dL Final         Passed - Patient is not pregnant      Passed - Valid encounter within last 12 months    Recent Outpatient Visits           4 months ago Other neutropenia [facility])   [place] [facility] Alba Cory, MD   9 months ago Encounter for medical examination to establish care   [facility] Alba Cory, MD   1 year ago Intertrigo   [facility] Malva Limes, MD   1 year ago Annual physical exam   [facility]  Malva Limes, MD   2 years ago Annual physical exam   [facility] Malva Limes, MD       Future Appointments             In 1 month Carlynn Purl, Danna Hefty, MD [facility], [facility]

## 2023-12-29 ENCOUNTER — Other Ambulatory Visit: Payer: Self-pay | Admitting: Family Medicine

## 2023-12-29 DIAGNOSIS — I779 Disorder of arteries and arterioles, unspecified: Secondary | ICD-10-CM

## 2023-12-29 DIAGNOSIS — E7800 Pure hypercholesterolemia, unspecified: Secondary | ICD-10-CM

## 2023-12-29 MED ORDER — ROSUVASTATIN CALCIUM 40 MG PO TABS: 40.0000 mg | ORAL_TABLET | Freq: Every day | ORAL | 0 refills | Status: DC

## 2023-12-29 NOTE — Telephone Encounter (Signed)
 Copied from CRM [id number]. Topic: Clinical - Medication Refill >> Dec 29, 2023 10:56 AM Louie Casa B wrote: Most Recent Primary Care Visit:  Provider: Hal Hope  Department: [facility]  Visit Type: MEDICARE WELL VISIT  Date: 09/17/2023  Medication: rosuvastatin (CRESTOR) 40 MG tablet  Has the patient contacted their pharmacy? Yes (Agent: If no, request that the patient contact the pharmacy for the refill. If patient does not wish to contact the pharmacy document the reason why and proceed with request.) (Agent: If yes, when and what did the pharmacy advise?)  Is this the correct pharmacy for this prescription? Yes If no, delete pharmacy and type the correct one.  This is the patient's preferred pharmacy:   [facility]) - [place], [place] - [address] [facility] [address] [place] [address] [place] [address] Phone: [phone] Fax: [phone]   Has the prescription been filled recently? Yes  Is the patient out of the medication? Yes  Has the patient been seen for an appointment in the last year OR does the patient have an upcoming appointment? No  Can we respond through MyChart? No  Agent: Please be advised that Rx refills may take up to 3 business days. We ask that you follow-up with your pharmacy.

## 2024-01-20 ENCOUNTER — Other Ambulatory Visit: Payer: Self-pay | Admitting: Family Medicine

## 2024-01-20 DIAGNOSIS — I779 Disorder of arteries and arterioles, unspecified: Secondary | ICD-10-CM

## 2024-01-20 DIAGNOSIS — E7800 Pure hypercholesterolemia, unspecified: Secondary | ICD-10-CM

## 2024-01-20 NOTE — Telephone Encounter (Signed)
 Copied from CRM [id number]. Topic: Clinical - Medication Refill >> Jan 20, 2024  3:49 PM Zipporah Him wrote: Most Recent Primary Care Visit:  Provider: Pinky Bright  Department: [facility]  Visit Type: MEDICARE WELL VISIT  Date: 09/17/2023  Medication: rosuvastatin  (CRESTOR ) 40 MG tablet  Has the patient contacted their pharmacy? Yes No refills needs some to last till appointment in may  Is this the correct pharmacy for this prescription? Yes If no, delete pharmacy and type the correct one.  This is the patient's preferred pharmacy:   [facility]) - [place], [place] - [address] [facility] [address] [place] [address] [place] [address] Phone: [phone] Fax: [phone]   Has the prescription been filled recently? No  Is the patient out of the medication? No  Has the patient been seen for an appointment in the last year OR does the patient have an upcoming appointment? Yes  Can we respond through MyChart? No  Agent: Please be advised that Rx refills may take up to 3 business days. We ask that you follow-up with your pharmacy.

## 2024-01-21 MED ORDER — ROSUVASTATIN CALCIUM 40 MG PO TABS: 40.0000 mg | ORAL_TABLET | Freq: Every day | ORAL | 0 refills | Status: DC

## 2024-01-21 NOTE — Telephone Encounter (Signed)
 Requested Prescriptions  Pending Prescriptions Disp Refills   rosuvastatin  (CRESTOR ) 40 MG tablet 90 tablet 0    Sig: Take 1 tablet (40 mg total) by mouth daily. In place of Atorvastatin      Cardiovascular:  Antilipid - Statins 2 Failed - 01/21/2024  2:57 PM      Failed - Valid encounter within last 12 months    Recent Outpatient Visits   None     Future Appointments             In 4 weeks Sowles, Krichna, MD [facility]            Failed - Lipid Panel in normal range within the last 12 months    Cholesterol, Total  Date Value Ref Range Status  07/07/2022 197 100 - 199 mg/dL Final   Cholesterol  Date Value Ref Range Status  03/27/2023 199 <200 mg/dL Final   LDL Cholesterol (Calc)  Date Value Ref Range Status  03/27/2023 107 (H) mg/dL (calc) Final    Comment:    Reference range: <100 . Desirable range <100 mg/dL for primary prevention;   <70 mg/dL for patients with CHD or diabetic patients  with > or = 2 CHD risk factors. Aaron Aas LDL-C is now calculated using the Martin-Hopkins  calculation, which is a validated novel method providing  better accuracy than the Friedewald equation in the  estimation of LDL-C.  Melinda Sprawls et al. Erroll Heard. 1610;960(45): 2061-2068  (http://education.[facility].com/faq/FAQ164)    HDL  Date Value Ref Range Status  03/27/2023 77 > OR = 50 mg/dL Final  40/98/1191 72 >47 mg/dL Final   Triglycerides  Date Value Ref Range Status  03/27/2023 67 <150 mg/dL Final         Passed - Cr in normal range and within 360 days    Creat  Date Value Ref Range Status  03/27/2023 0.70 0.60 - 1.00 mg/dL Final         Passed - Patient is not pregnant

## 2024-02-18 ENCOUNTER — Ambulatory Visit (INDEPENDENT_AMBULATORY_CARE_PROVIDER_SITE_OTHER): Payer: Self-pay | Admitting: Family Medicine

## 2024-02-18 ENCOUNTER — Encounter: Payer: Self-pay | Admitting: Family Medicine

## 2024-02-18 VITALS — BP 144/96 | HR 93 | Resp 16 | Ht 65.5 in | Wt 187.7 lb

## 2024-02-18 DIAGNOSIS — E7800 Pure hypercholesterolemia, unspecified: Secondary | ICD-10-CM

## 2024-02-18 DIAGNOSIS — I779 Disorder of arteries and arterioles, unspecified: Secondary | ICD-10-CM

## 2024-02-18 DIAGNOSIS — M8580 Other specified disorders of bone density and structure, unspecified site: Secondary | ICD-10-CM

## 2024-02-18 DIAGNOSIS — D708 Other neutropenia: Secondary | ICD-10-CM

## 2024-02-18 DIAGNOSIS — I10 Essential (primary) hypertension: Secondary | ICD-10-CM | POA: Diagnosis not present

## 2024-02-18 DIAGNOSIS — Z780 Asymptomatic menopausal state: Secondary | ICD-10-CM

## 2024-02-18 DIAGNOSIS — Z1231 Encounter for screening mammogram for malignant neoplasm of breast: Secondary | ICD-10-CM

## 2024-02-18 MED ORDER — AMLODIPINE BESYLATE 2.5 MG PO TABS: 2.5000 mg | ORAL_TABLET | Freq: Every day | ORAL | 1 refills | Status: DC

## 2024-02-18 MED ORDER — METOPROLOL SUCCINATE ER 25 MG PO TB24: 25.0000 mg | ORAL_TABLET | Freq: Every day | ORAL | 1 refills | Status: DC

## 2024-02-18 MED ORDER — ROSUVASTATIN CALCIUM 40 MG PO TABS: 40.0000 mg | ORAL_TABLET | Freq: Every day | ORAL | 1 refills | Status: DC

## 2024-02-18 NOTE — Progress Notes (Signed)
 Name: [name] [name]   MRN: [id number]    DOB: December 26, 1945   Date:02/18/2024       Progress Note  Subjective  Chief Complaint  Chief Complaint  Patient presents with   Medical Management of Chronic Issues    Pt has not been taking rosuvastatin  or metoprolol    Discussed the use of AI scribe software for clinical note transcription with the patient, who gave verbal consent to proceed.  History of Present Illness [name] [name] is a 78 year old female with hypertension and hyperlipidemia who presents for a six-month follow-up visit.  She has experienced issues with medication refills due to a change in insurance from [place] to [employer] earlier this year, resulting in her not receiving her medications. She has been without her blood pressure medications, metoprolol  25 mg and amlodipine  2.5 mg, which she used to take regularly. She has been monitoring her blood pressure at home, noting that it was 150/98 mmHg during the visit, although she reports it has been around 130/80 mmHg at home. She attributes the higher reading to stress and has been trying to manage her blood pressure through walking and dietary changes. She has a history of premature ventricular contractions and has previously taken metoprolol .  She has a history of hyperlipidemia and was previously on rosuvastatin , but she has not been taking it due to the insurance issues. Her last cholesterol check showed her LDL was 107 mg/dL, which is higher than desired given her history of carotid plaque.  She has osteopenia, with her last bone density scan in September of the previous year showing no significant changes. She reports occasional hot flashes and swelling in her ankles, which she attributes to being on her feet a lot.  She experiences occasional tingling in her first through fourth fingers, which she describes as random and not constant, with no associated weakness or dropping of objects.  She has a history of incomplete voiding  and vaginal prolapse following a hysterectomy, but she did not pursue further procedures. The symptoms are stable and do not require the use of pads or pull-ups.    Patient Active Problem List   Diagnosis Date Noted   Chronic gastritis 08/11/2023   Calculus of gallbladder without cholecystitis without obstruction 03/26/2023   Other neutropenia (HCC) 03/26/2023   History of Helicobacter pylori infection 03/26/2023   Non-recurrent bilateral inguinal hernia without obstruction or gangrene    Mild carotid artery disease (HCC) 06/02/2019   History of adenomatous polyp of colon 06/30/2018   Osteopenia after menopause 02/20/2017   Acid reflux 02/20/2016   Hypercholesteremia 02/20/2016   Primary hypertension 02/20/2016   Irregular cardiac rhythm 02/20/2016   Calculus of kidney 02/20/2016   Atrophy of thyroid  02/20/2016   Neck pain on left side 02/06/2014   Asymptomatic PVCs 02/06/2014    Past Surgical History:  Procedure Laterality Date   ABDOMINAL HYSTERECTOMY  2007   total   BIOPSY  08/07/2023   Procedure: BIOPSY;  Surgeon: Shane Darling, MD;  Location: [facility];  Service: Endoscopy;;   BREAST BIOPSY Left 2007   Dr. Butch Cashing office-benign   COLONOSCOPY  11/12/2001   multiple sessile polyps found in rectum, 5mm in size, distributed diffusely, biopsies were taken. The polyps extended from lower rectum to approximately 20 cm. They were confluent in the area between 10-15cm. Small polyps with central ulceration were noted.   COLONOSCOPY WITH PROPOFOL  N/A 08/04/2018   Procedure: COLONOSCOPY WITH PROPOFOL ;  Surgeon: Marshall Skeeter, MD;  Location: [facility];  Service: Endoscopy;  Laterality: N/A;   COLONOSCOPY WITH PROPOFOL  N/A 06/04/2022   Procedure: COLONOSCOPY WITH PROPOFOL ;  Surgeon: Marshall Skeeter, MD;  Location: [facility];  Service: Endoscopy;  Laterality: N/A;   ESOPHAGOGASTRODUODENOSCOPY (EGD) WITH PROPOFOL  N/A 06/04/2022   Procedure:  ESOPHAGOGASTRODUODENOSCOPY (EGD) WITH PROPOFOL ;  Surgeon: Marshall Skeeter, MD;  Location: [facility];  Service: Endoscopy;  Laterality: N/A;  with polypectomy   ESOPHAGOGASTRODUODENOSCOPY (EGD) WITH PROPOFOL  N/A 07/30/2022   Procedure: ESOPHAGOGASTRODUODENOSCOPY (EGD) WITH PROPOFOL ;  Surgeon: Marshall Skeeter, MD;  Location: [facility];  Service: Endoscopy;  Laterality: N/A;   ESOPHAGOGASTRODUODENOSCOPY (EGD) WITH PROPOFOL  N/A 08/07/2023   Procedure: ESOPHAGOGASTRODUODENOSCOPY (EGD) WITH PROPOFOL ;  Surgeon: Shane Darling, MD;  Location: [facility];  Service: Endoscopy;  Laterality: N/A;   HERNIA REPAIR     TUBAL LIGATION     UMBILICAL HERNIA REPAIR N/A 10/01/2021   Procedure: HERNIA REPAIR UMBILICAL ADULT, open;  Surgeon: Emmalene Hare, MD;  Location: [facility] OR[facility];  Service: General;  Laterality: N/A;    Family History  Problem Relation Age of Onset   Hypertension Mother    Hyperlipidemia Mother    Other Mother        enlarged heart   Heart disease Father 21   Diabetes Father    Other Sister    Kidney failure Sister    Heart disease Sister    Hypertension Sister    Diabetes Sister    Heart disease Brother    Hypertension Brother    Kidney failure Brother    Hypertension Brother    Kidney failure Brother    Diabetes Brother    Cancer Brother    Colon cancer Brother    Diabetes Brother    Coronary artery disease Other        family hx   Hyperlipidemia Son    Hypertension Son    Hypertension Son    Hyperlipidemia Son    Breast cancer Neg Hx     Social History   Tobacco Use   Smoking status: Never   Smokeless tobacco: Never   Tobacco comments:    tobacco use - no  Substance Use Topics   Alcohol use: Never     Current Outpatient Medications:    acetaminophen  (TYLENOL ) 500 MG tablet, Take 2 tablets (1,000 mg total) by mouth every 6 (six) hours as needed for mild pain., Disp: , Rfl:    aspirin EC 81 MG tablet, Take by mouth., Disp: , Rfl:     Cholecalciferol (VITAMIN D3 PO), Take 5,000 Units by mouth in the morning., Disp: , Rfl:    Multiple Vitamin (MULTIVITAMIN WITH MINERALS) TABS tablet, Take 1 tablet by mouth in the morning. Centrum for Women, Disp: , Rfl:    nystatin  ointment (MYCOSTATIN ), Apply 1 Application topically 2 (two) times daily., Disp: 30 g, Rfl: 1   zinc gluconate 50 MG tablet, Take 50 mg by mouth daily., Disp: , Rfl:    amLODipine  (NORVASC ) 2.5 MG tablet, Take 1 tablet (2.5 mg total) by mouth daily., Disp: 90 tablet, Rfl: 1   metoprolol  succinate (TOPROL -XL) 25 MG 24 hr tablet, Take 1 tablet (25 mg total) by mouth daily., Disp: 90 tablet, Rfl: 1   rosuvastatin  (CRESTOR ) 40 MG tablet, Take 1 tablet (40 mg total) by mouth daily. In place of Atorvastatin , Disp: 90 tablet, Rfl: 1  No Known Allergies  I personally reviewed active problem list, medication list, allergies with the patient/caregiver today.   ROS  Ten  systems reviewed and is negative except as mentioned in HPI    Objective Physical Exam Constitutional: Patient appears well-developed and well-nourished. Obese  No distress.  HEENT: head atraumatic, normocephalic, pupils equal and reactive to light, neck supple Cardiovascular: Normal rate, regular rhythm and normal heart sounds.  No murmur heard. No BLE edema. Pulmonary/Chest: Effort normal and breath sounds normal. No respiratory distress. Abdominal: Soft.  There is no tenderness. Psychiatric: Patient has a normal mood and affect. behavior is normal. Judgment and thought content normal.    Vitals:   02/18/24 0926 02/18/24 1017  BP: (!) 150/98 (!) 144/96  Pulse: 93   Resp: 16   SpO2: 95%   Weight: 187 lb 11.2 oz (85.1 kg)   Height: 5' 5.5" (1.664 m)     Body mass index is 30.76 kg/m.    PHQ2/9:    02/18/2024    9:19 AM 09/17/2023   11:31 AM 08/21/2023    8:39 AM 03/26/2023    1:05 PM 07/07/2022    9:37 AM  Depression screen PHQ 2/9  Decreased Interest 0 0 0 0 0  Down, Depressed,  Hopeless 0 0 1 0 0  PHQ - 2 Score 0 0 1 0 0  Altered sleeping  0 0 0 3  Tired, decreased energy  0 0 0 0  Change in appetite  0 0 0 0  Feeling bad or failure about yourself   0 0 0 0  Trouble concentrating  0 0 0 0  Moving slowly or fidgety/restless  0 0 0 0  Suicidal thoughts  0 0 0 0  PHQ-9 Score  0 1 0 3  Difficult doing work/chores  Not difficult at all Somewhat difficult Not difficult at all Not difficult at all    phq 9 is negative  Fall Risk:    02/18/2024    9:18 AM 09/17/2023   11:33 AM 08/21/2023    8:23 AM 03/26/2023    1:04 PM 07/07/2022    9:37 AM  Fall Risk   Falls in the past year? 0 0 0 0 0  Number falls in past yr: 0 0  0 0  Injury with Fall? 0 0  0 0  Risk for fall due to : No Fall Risks No Fall Risks No Fall Risks No Fall Risks No Fall Risks  Follow up Falls prevention discussed;Education provided;Falls evaluation completed Falls prevention discussed;Falls evaluation completed Falls prevention discussed Falls prevention discussed;Education provided;Falls evaluation completed Falls evaluation completed     Assessment & Plan Hypertension Uncontrolled due to medication lapse. Blood pressure 150/98 mmHg, home readings 130/80 mmHg. Lifestyle modifications in place. Cautious medication restart needed to prevent rapid reduction effects. - Start amlodipine  2.5 mg today. - Add metoprolol  25 mg after three days if blood pressure stable and no palpitations. - Monitor blood pressure at home. - Return for blood pressure check in three weeks.  Carotid artery stenosis Stenosis with elevated cholesterol history. LDL previously 107 mg/dL, target below 70 mg/dL due to plaque. Medication resumption crucial to prevent stroke. - Restart rosuvastatin . - Check cholesterol levels in six months after resuming medication.  Peripheral edema Edema likely from prolonged standing, expected to worsen with amlodipine . Not associated with shortness of breath, occurs end of day. - Advise  elevation of feet and use of compression stockings. - Encourage leg movement and exercises.  Osteopenia No significant changes in bone density. FRAX score indicates low fracture risk. No medication needed, focus on dietary and lifestyle modifications. -  Encourage high calcium  diet. - Recommend daily vitamin D supplementation of 2000 IU. - Promote physical activity.  Carpal tunnel syndrome Intermittent tingling in fingers, likely carpal tunnel syndrome. Symptoms random, not associated with weakness or dropping objects.  Neutropenia Previously monitored and normalized. Likely idiopathic or naturally low white count. Continued monitoring for stability. - Order CBC to recheck white blood cell count.  General Health Maintenance Routine health maintenance up to date. Due for mammogram. - Schedule and complete mammogram screening.

## 2024-03-10 ENCOUNTER — Ambulatory Visit

## 2024-03-10 VITALS — BP 132/84

## 2024-03-10 DIAGNOSIS — I10 Essential (primary) hypertension: Secondary | ICD-10-CM

## 2024-03-10 NOTE — Progress Notes (Signed)
 Patient is in office today for a nurse visit for Blood Pressure Check. Patient blood pressure was 1st attempt 144/86 and 2nd attempt(rested 10 mins) 132/84, Patient No chest pain, No shortness of breath, No dyspnea on exertion, No orthopnea, No paroxysmal nocturnal dyspnea, No edema, No palpitations, No syncope, patient has followed instructions and resumed medications. Patient has been checking at home and runs in the 130s/80s.   Previous OV note: Hypertension - Start amlodipine  2.5 mg today. - Add metoprolol  25 mg after three days if blood pressure stable and no palpitations. - Monitor blood pressure at home. - Return for blood pressure check in three weeks.

## 2024-03-11 NOTE — Progress Notes (Signed)
 No answer from patient left detailed vm

## 2024-04-03 ENCOUNTER — Other Ambulatory Visit: Payer: Self-pay | Admitting: Family Medicine

## 2024-04-03 DIAGNOSIS — E7800 Pure hypercholesterolemia, unspecified: Secondary | ICD-10-CM

## 2024-04-03 DIAGNOSIS — I779 Disorder of arteries and arterioles, unspecified: Secondary | ICD-10-CM

## 2024-04-03 DIAGNOSIS — I10 Essential (primary) hypertension: Secondary | ICD-10-CM

## 2024-06-07 ENCOUNTER — Ambulatory Visit
Admission: RE | Admit: 2024-06-07 | Discharge: 2024-06-07 | Disposition: A | Discharge status: Home / Self Care | Source: Ambulatory Visit | Attending: Family Medicine | Admitting: Family Medicine

## 2024-06-07 DIAGNOSIS — Z1231 Encounter for screening mammogram for malignant neoplasm of breast: Secondary | ICD-10-CM | POA: Diagnosis not present

## 2024-06-18 ENCOUNTER — Other Ambulatory Visit: Payer: Self-pay | Admitting: Family Medicine

## 2024-06-18 DIAGNOSIS — E7800 Pure hypercholesterolemia, unspecified: Secondary | ICD-10-CM

## 2024-06-18 DIAGNOSIS — I779 Disorder of arteries and arterioles, unspecified: Secondary | ICD-10-CM

## 2024-06-18 DIAGNOSIS — I10 Essential (primary) hypertension: Secondary | ICD-10-CM

## 2024-07-04 ENCOUNTER — Other Ambulatory Visit: Payer: Self-pay | Admitting: Family Medicine

## 2024-07-04 DIAGNOSIS — I10 Essential (primary) hypertension: Secondary | ICD-10-CM

## 2024-07-05 ENCOUNTER — Other Ambulatory Visit: Payer: Self-pay | Admitting: Family Medicine

## 2024-07-05 DIAGNOSIS — I10 Essential (primary) hypertension: Secondary | ICD-10-CM

## 2024-07-05 DIAGNOSIS — E7800 Pure hypercholesterolemia, unspecified: Secondary | ICD-10-CM

## 2024-07-05 DIAGNOSIS — I779 Disorder of arteries and arterioles, unspecified: Secondary | ICD-10-CM

## 2024-07-29 ENCOUNTER — Telehealth: Payer: Self-pay

## 2024-07-29 DIAGNOSIS — E7800 Pure hypercholesterolemia, unspecified: Secondary | ICD-10-CM

## 2024-07-29 DIAGNOSIS — I779 Disorder of arteries and arterioles, unspecified: Secondary | ICD-10-CM

## 2024-07-29 MED ORDER — ROSUVASTATIN CALCIUM 40 MG PO TABS: 40.0000 mg | ORAL_TABLET | Freq: Every day | ORAL | 0 refills | Status: DC

## 2024-07-29 NOTE — Progress Notes (Signed)
 Pharmacy Quality Measure Review  This patient is appearing on a report for being at risk of failing the adherence measure for cholesterol (statin) medications this calendar year.   Medication: rosuvastatin  Last fill date: 05/03/24 for 80 day supply  No refills left at the pharmacy at this time. Will collaborate with provider to facilitate refill needs.   Oral Remache E. Marsh, PharmD, CPP Clinical Pharmacist [facility] [phone]

## 2024-08-19 ENCOUNTER — Encounter: Payer: Self-pay | Admitting: Family Medicine

## 2024-08-19 ENCOUNTER — Ambulatory Visit: Admitting: Family Medicine

## 2024-08-19 VITALS — BP 136/74 | HR 64 | Resp 16 | Ht 65.5 in | Wt 190.1 lb

## 2024-08-19 DIAGNOSIS — I10 Essential (primary) hypertension: Secondary | ICD-10-CM

## 2024-08-19 DIAGNOSIS — E7800 Pure hypercholesterolemia, unspecified: Secondary | ICD-10-CM

## 2024-08-19 DIAGNOSIS — D708 Other neutropenia: Secondary | ICD-10-CM

## 2024-08-19 DIAGNOSIS — Z23 Encounter for immunization: Secondary | ICD-10-CM | POA: Diagnosis not present

## 2024-08-19 DIAGNOSIS — M8580 Other specified disorders of bone density and structure, unspecified site: Secondary | ICD-10-CM | POA: Diagnosis not present

## 2024-08-19 DIAGNOSIS — M25661 Stiffness of right knee, not elsewhere classified: Secondary | ICD-10-CM

## 2024-08-19 DIAGNOSIS — Z780 Asymptomatic menopausal state: Secondary | ICD-10-CM

## 2024-08-19 DIAGNOSIS — K319 Disease of stomach and duodenum, unspecified: Secondary | ICD-10-CM

## 2024-08-19 LAB — CBC WITH DIFFERENTIAL/PLATELET
Absolute Lymphocytes: 1155 {cells}/uL (ref 850–3900)
Absolute Monocytes: 440 {cells}/uL (ref 200–950)
Basophils Absolute: 20 {cells}/uL (ref 0–200)
Basophils Relative: 0.4 %
Eosinophils Absolute: 180 {cells}/uL (ref 15–500)
Eosinophils Relative: 3.6 %
HCT: 39.9 % (ref 35.0–45.0)
Hemoglobin: 13.3 g/dL (ref 11.7–15.5)
MCH: 32.1 pg (ref 27.0–33.0)
MCHC: 33.3 g/dL (ref 32.0–36.0)
MCV: 96.4 fL (ref 80.0–100.0)
MPV: 11.2 fL (ref 7.5–12.5)
Monocytes Relative: 8.8 %
Neutro Abs: 3205 {cells}/uL (ref 1500–7800)
Neutrophils Relative %: 64.1 %
Platelets: 172 Thousand/uL (ref 140–400)
RBC: 4.14 Million/uL (ref 3.80–5.10)
RDW: 13.1 % (ref 11.0–15.0)
Total Lymphocyte: 23.1 %
WBC: 5 Thousand/uL (ref 3.8–10.8)

## 2024-08-19 LAB — COMPREHENSIVE METABOLIC PANEL WITH GFR
AG Ratio: 1.4 (calc) (ref 1.0–2.5)
ALT: 16 U/L (ref 6–29)
AST: 19 U/L (ref 10–35)
Albumin: 4.1 g/dL (ref 3.6–5.1)
Alkaline phosphatase (APISO): 65 U/L (ref 37–153)
BUN: 14 mg/dL (ref 7–25)
CO2: 27 mmol/L (ref 20–32)
Calcium: 9.4 mg/dL (ref 8.6–10.4)
Chloride: 106 mmol/L (ref 98–110)
Creat: 0.7 mg/dL (ref 0.60–1.00)
Globulin: 2.9 g/dL (ref 1.9–3.7)
Glucose, Bld: 92 mg/dL (ref 65–99)
Potassium: 4.4 mmol/L (ref 3.5–5.3)
Sodium: 143 mmol/L (ref 135–146)
Total Bilirubin: 0.5 mg/dL (ref 0.2–1.2)
Total Protein: 7 g/dL (ref 6.1–8.1)
eGFR: 88 mL/min/1.73m2 (ref >=60)

## 2024-08-19 LAB — LIPID PANEL
Cholesterol: 167 mg/dL (ref <200)
HDL: 76 mg/dL (ref >=50)
LDL Cholesterol (Calc): 74 mg/dL
Non-HDL Cholesterol (Calc): 91 mg/dL (ref <130)
Total CHOL/HDL Ratio: 2.2 (calc) (ref <5.0)
Triglycerides: 89 mg/dL (ref <150)

## 2024-08-19 MED ORDER — METOPROLOL SUCCINATE ER 25 MG PO TB24: 25.0000 mg | ORAL_TABLET | Freq: Every day | ORAL | 1 refills | Status: DC

## 2024-08-19 MED ORDER — AMLODIPINE BESYLATE 5 MG PO TABS: 5.0000 mg | ORAL_TABLET | Freq: Every day | ORAL | 0 refills | Status: DC

## 2024-08-19 MED ORDER — ROSUVASTATIN CALCIUM 40 MG PO TABS: 40.0000 mg | ORAL_TABLET | Freq: Every day | ORAL | 1 refills | Status: AC

## 2024-08-19 NOTE — Progress Notes (Signed)
 Name: [name] [name]   MRN: [id number]    DOB: 08-Sep-1946   Date:08/19/2024       Progress Note  Subjective  Chief Complaint  Chief Complaint  Patient presents with   Medical Management of Chronic Issues   Discussed the use of AI scribe software for clinical note transcription with the patient, who gave verbal consent to proceed.  History of Present Illness [name] [name] is a 78 year old female with hypertension and chronic gastritis who presents for follow-up of her blood pressure management and gastrointestinal concerns.  She has persistent hypertension despite being on metoprolol  25 mg and amlodipine  2.5 mg once daily. No side effects from her current medications.  She has a history of chronic gastritis and a pedunculated lesion in her stomach identified during a CT scan in July 2023. She has not undergone an endoscopy to evaluate this lesion due to personal circumstances. No current symptoms of heartburn or indigestion.  She experiences right lower quadrant pain, evaluated with a CT scan in July 2023, revealing a kidney stone and a hiatal hernia and also a pedunculated lesion on stomach. The kidney stone was non-obstructive, and she has not reported any recent issues related to these findings.  She reports stiffness and discomfort in her right knee, particularly after sitting for extended periods. The sensation is described as 'tight' but without significant pain. She uses a cane for stability and engages in regular exercise to manage the stiffness.  Her current medications include rosuvastatin  40 mg for cholesterol, vitamin D, and a B12 supplement. No issues with these medications. She recently received a flu shot and is up to date with her vaccinations.    Patient Active Problem List   Diagnosis Date Noted   Chronic gastritis 08/11/2023   Calculus of gallbladder without cholecystitis without obstruction 03/26/2023   Other neutropenia 03/26/2023   History of Helicobacter pylori  infection 03/26/2023   Non-recurrent bilateral inguinal hernia without obstruction or gangrene    Mild carotid artery disease 06/02/2019   History of adenomatous polyp of colon 06/30/2018   Osteopenia after menopause 02/20/2017   Acid reflux 02/20/2016   Hypercholesteremia 02/20/2016   Primary hypertension 02/20/2016   Irregular cardiac rhythm 02/20/2016   Calculus of kidney 02/20/2016   Atrophy of thyroid  02/20/2016   Neck pain on left side 02/06/2014   Asymptomatic PVCs 02/06/2014    Past Surgical History:  Procedure Laterality Date   ABDOMINAL HYSTERECTOMY  2007   total   BIOPSY  08/07/2023   Procedure: BIOPSY;  Surgeon: Maryruth Ole DASEN, MD;  Location: [facility];  Service: Endoscopy;;   BREAST BIOPSY Left 2007   Dr. Fredirick office-benign   COLONOSCOPY  11/12/2001   multiple sessile polyps found in rectum, 5mm in size, distributed diffusely, biopsies were taken. The polyps extended from lower rectum to approximately 20 cm. They were confluent in the area between 10-15cm. Small polyps with central ulceration were noted.   COLONOSCOPY WITH PROPOFOL  N/A 08/04/2018   Procedure: COLONOSCOPY WITH PROPOFOL ;  Surgeon: Dessa Reyes ORN, MD;  Location: [facility];  Service: Endoscopy;  Laterality: N/A;   COLONOSCOPY WITH PROPOFOL  N/A 06/04/2022   Procedure: COLONOSCOPY WITH PROPOFOL ;  Surgeon: Dessa Reyes ORN, MD;  Location: [facility];  Service: Endoscopy;  Laterality: N/A;   ESOPHAGOGASTRODUODENOSCOPY (EGD) WITH PROPOFOL  N/A 06/04/2022   Procedure: ESOPHAGOGASTRODUODENOSCOPY (EGD) WITH PROPOFOL ;  Surgeon: Dessa Reyes ORN, MD;  Location: [facility];  Service: Endoscopy;  Laterality: N/A;  with polypectomy   ESOPHAGOGASTRODUODENOSCOPY (EGD)  WITH PROPOFOL  N/A 07/30/2022   Procedure: ESOPHAGOGASTRODUODENOSCOPY (EGD) WITH PROPOFOL ;  Surgeon: Dessa Reyes ORN, MD;  Location: [facility];  Service: Endoscopy;  Laterality: N/A;   ESOPHAGOGASTRODUODENOSCOPY (EGD)  WITH PROPOFOL  N/A 08/07/2023   Procedure: ESOPHAGOGASTRODUODENOSCOPY (EGD) WITH PROPOFOL ;  Surgeon: Maryruth Ole DASEN, MD;  Location: [facility];  Service: Endoscopy;  Laterality: N/A;   HERNIA REPAIR     TUBAL LIGATION     UMBILICAL HERNIA REPAIR N/A 10/01/2021   Procedure: HERNIA REPAIR UMBILICAL ADULT, open;  Surgeon: Desiderio Schanz, MD;  Location: [facility];  Service: General;  Laterality: N/A;    Family History  Problem Relation Age of Onset   Hypertension Mother    Hyperlipidemia Mother    Other Mother        enlarged heart   Heart disease Father 2   Diabetes Father    Other Sister    Kidney failure Sister    Heart disease Sister    Hypertension Sister    Diabetes Sister    Heart disease Brother    Hypertension Brother    Kidney failure Brother    Hypertension Brother    Kidney failure Brother    Diabetes Brother    Cancer Brother    Colon cancer Brother    Diabetes Brother    Coronary artery disease Other        family hx   Hyperlipidemia Son    Hypertension Son    Hypertension Son    Hyperlipidemia Son    Breast cancer Neg Hx     Social History   Tobacco Use   Smoking status: Never   Smokeless tobacco: Never   Tobacco comments:    tobacco use - no  Substance Use Topics   Alcohol use: Never     Current Outpatient Medications:    acetaminophen  (TYLENOL ) 500 MG tablet, Take 2 tablets (1,000 mg total) by mouth every 6 (six) hours as needed for mild pain., Disp: , Rfl:    aspirin EC 81 MG tablet, Take by mouth., Disp: , Rfl:    Cholecalciferol (VITAMIN D3 PO), Take 5,000 Units by mouth in the morning., Disp: , Rfl:    Multiple Vitamin (MULTIVITAMIN WITH MINERALS) TABS tablet, Take 1 tablet by mouth in the morning. Centrum for Women, Disp: , Rfl:    nystatin  ointment (MYCOSTATIN ), Apply 1 Application topically 2 (two) times daily., Disp: 30 g, Rfl: 1   zinc gluconate 50 MG tablet, Take 50 mg by mouth daily., Disp: , Rfl:    amLODipine  (NORVASC ) 5 MG  tablet, Take 1 tablet (5 mg total) by mouth daily., Disp: 90 tablet, Rfl: 0   metoprolol  succinate (TOPROL -XL) 25 MG 24 hr tablet, Take 1 tablet (25 mg total) by mouth daily., Disp: 90 tablet, Rfl: 1   rosuvastatin  (CRESTOR ) 40 MG tablet, Take 1 tablet (40 mg total) by mouth daily., Disp: 90 tablet, Rfl: 1  No Known Allergies  I personally reviewed active problem list, medication list, allergies, family history with the patient/caregiver today.   ROS  Ten systems reviewed and is negative except as mentioned in HPI    Objective Physical Exam CONSTITUTIONAL: Patient appears well-developed and well-nourished. No distress. HEENT: Head atraumatic, normocephalic, neck supple. CARDIOVASCULAR: Normal rate, regular rhythm and normal heart sounds. No murmur heard. No BLE edema. PULMONARY: Effort normal and breath sounds normal. No respiratory distress. ABDOMINAL: There is no tenderness or distention. MUSCULOSKELETAL: Normal gait. Without gross motor or sensory deficit. No calf tenderness on palpation. Decreased range  of motion in right knee. PSYCHIATRIC: Patient has a normal mood and affect. Behavior is normal. Judgment and thought content normal.  Vitals:   08/19/24 0852 08/19/24 0942  BP: (!) 160/88 136/74  Pulse: 64   Resp: 16   SpO2: 97%   Weight: 190 lb 1.6 oz (86.2 kg)   Height: 5' 5.5 (1.664 m)     Body mass index is 31.15 kg/m.   PHQ2/9:    08/19/2024    8:46 AM 02/18/2024    9:19 AM 09/17/2023   11:31 AM 08/21/2023    8:39 AM 03/26/2023    1:05 PM  Depression screen PHQ 2/9  Decreased Interest 0 0 0 0 0  Down, Depressed, Hopeless 0 0 0 1 0  PHQ - 2 Score 0 0 0 1 0  Altered sleeping   0 0 0  Tired, decreased energy   0 0 0  Change in appetite   0 0 0  Feeling bad or failure about yourself    0 0 0  Trouble concentrating   0 0 0  Moving slowly or fidgety/restless   0 0 0  Suicidal thoughts   0 0 0  PHQ-9 Score   0  1  0   Difficult doing work/chores   Not  difficult at all Somewhat difficult Not difficult at all     Data saved with a previous flowsheet row definition    phq 9 is negative  Fall Risk:    08/19/2024    8:46 AM 02/18/2024    9:18 AM 09/17/2023   11:33 AM 08/21/2023    8:23 AM 03/26/2023    1:04 PM  Fall Risk   Falls in the past year? 0 0 0 0 0  Number falls in past yr: 0 0 0  0  Injury with Fall? 0 0 0  0  Risk for fall due to : No Fall Risks No Fall Risks No Fall Risks No Fall Risks No Fall Risks  Follow up Falls evaluation completed Falls prevention discussed;Education provided;Falls evaluation completed Falls prevention discussed;Falls evaluation completed Falls prevention discussed Falls prevention discussed;Education provided;Falls evaluation completed      Assessment & Plan Essential hypertension Blood pressure uncontrolled on metoprolol  25 mg and amlodipine . Risk of stroke, heart attack, or heart failure due to persistent hypertension. - Increased amlodipine  to 5 mg daily. BP improved with rest, but usually high on all office visits Monitor for orthostatic changes - Monitor blood pressure at home and report readings within two weeks.   Pure hypercholesterolemia Managed with rosuvastatin  40 mg daily without side effects. - Continue rosuvastatin  40 mg daily. - Ordered cholesterol panel to monitor lipid levels.  Osteopenia of femur Osteopenia with T-score worsening from -1.3 to -1.7. Fracture risk not significantly high. - Schedule bone density scan in September next year.  Stiffness of right knee Stiffness and discomfort possibly due to arthritis or Baker's cyst. Amlodipine  may contribute to leg swelling. - Use Tylenol  for pain management. - Apply Voltaren gel topically for stiffness. - Consider [facility] evaluation for possible x-ray or ultrasound.  Intestinal metaplasia of gastric mucosa with chronic gastritis and pedunculated gastric lesion Chronic gastritis with intestinal metaplasia and  pedunculated gastric lesion. Endoscopy important to prevent potential cancer development. - Schedule appointment with gastroenterologist in March for endoscopy.  General Health Maintenance Routine health maintenance discussed, including vaccinations and screenings. - Ensure tetanus vaccination is up to date. - Continue vitamin D supplementation. - Schedule mammogram and bone density  scan in September next year.

## 2024-08-22 ENCOUNTER — Ambulatory Visit: Payer: Self-pay | Admitting: Family Medicine

## 2024-08-31 ENCOUNTER — Telehealth: Payer: Self-pay | Admitting: Family Medicine

## 2024-08-31 DIAGNOSIS — E7800 Pure hypercholesterolemia, unspecified: Secondary | ICD-10-CM

## 2024-08-31 NOTE — Telephone Encounter (Unsigned)
 Copied from CRM #[id number]. Topic: Clinical - Medication Refill >> Aug 31, 2024 10:49 AM Sophia H wrote: Medication: rosuvastatin  (CRESTOR ) 40 MG tablet   Has the patient contacted their pharmacy? Yes, need updated RX per pharmacy.   This is the patient's preferred pharmacy:  [facility] - [place], [place] - [address] [place] [address] [place]  [address] Phone: [phone] Fax: [phone]  Is this the correct pharmacy for this prescription? Yes If no, delete pharmacy and type the correct one.   Has the prescription been filled recently? Yes  Is the patient out of the medication? Yes  Has the patient been seen for an appointment in the last year OR does the patient have an upcoming appointment? Yes, appt on 11/21.  Can we respond through MyChart? Yes  Agent: Please be advised that Rx refills may take up to 3 business days. We ask that you follow-up with your pharmacy.

## 2024-09-02 NOTE — Telephone Encounter (Signed)
 Requested Prescriptions  Refused Prescriptions Disp Refills   rosuvastatin  (CRESTOR ) 40 MG tablet 90 tablet 1    Sig: Take 1 tablet (40 mg total) by mouth daily.     Cardiovascular:  Antilipid - Statins 2 Failed - 09/02/2024  4:32 PM      Failed - Lipid Panel in normal range within the last 12 months    Cholesterol, Total  Date Value Ref Range Status  07/07/2022 197 100 - 199 mg/dL Final   Cholesterol  Date Value Ref Range Status  08/19/2024 167 <200 mg/dL Final   LDL Cholesterol (Calc)  Date Value Ref Range Status  08/19/2024 74 mg/dL (calc) Final    Comment:    Reference range: <100 . Desirable range <100 mg/dL for primary prevention;   <70 mg/dL for patients with CHD or diabetic patients  with > or = 2 CHD risk factors. SABRA LDL-C is now calculated using the Martin-Hopkins  calculation, which is a validated novel method providing  better accuracy than the Friedewald equation in the  estimation of LDL-C.  Gladis APPLETHWAITE et al. SANDREA. 7986;689(80): 2061-2068  (http://education.[facility].com/faq/FAQ164)    HDL  Date Value Ref Range Status  08/19/2024 76 > OR = 50 mg/dL Final  89/90/7976 72 >60 mg/dL Final   Triglycerides  Date Value Ref Range Status  08/19/2024 89 <150 mg/dL Final         Passed - Cr in normal range and within 360 days    Creat  Date Value Ref Range Status  08/19/2024 0.70 0.60 - 1.00 mg/dL Final         Passed - Patient is not pregnant      Passed - Valid encounter within last 12 months    Recent Outpatient Visits           2 weeks ago Uncontrolled hypertension   [facility] Glenard Mire, MD   6 months ago Mild carotid artery disease   [facility] Sowles, Krichna, MD

## 2024-10-05 ENCOUNTER — Telehealth: Payer: Self-pay | Admitting: Family Medicine

## 2024-10-05 NOTE — Telephone Encounter (Signed)
 Copied from CRM [id number]. Topic: Appointments - Scheduling Inquiry for Clinic >> Oct 05, 2024  8:28 AM Donna BRAVO wrote: Reason for CRM:  Unable to schedule AWV appt  warning states: No solutions found without overigning.    Please call patient after 3pm to schedule appt [phone] ----------------------------------------------------------------------- From previous Reason for Contact - Scheduling: Patient/patient representative is calling to schedule an appointment. Refer to attachments for appointment information.  AWV appt

## 2024-10-13 ENCOUNTER — Other Ambulatory Visit: Payer: Self-pay | Admitting: Family Medicine

## 2024-10-13 DIAGNOSIS — I10 Essential (primary) hypertension: Secondary | ICD-10-CM

## 2024-10-14 NOTE — Telephone Encounter (Signed)
 Requested Prescriptions  Pending Prescriptions Disp Refills   metoprolol  succinate (TOPROL -XL) 25 MG 24 hr tablet [Pharmacy Med Name: Metoprolol  Succinate ER 25 MG Oral Tablet Extended Release 24 Hour] 100 tablet 2    Sig: TAKE 1 TABLET BY MOUTH DAILY     Cardiovascular:  Beta Blockers Passed - 10/14/2024 12:28 PM      Passed - Last BP in normal range    BP Readings from Last 1 Encounters:  08/19/24 136/74         Passed - Last Heart Rate in normal range    Pulse Readings from Last 1 Encounters:  08/19/24 64         Passed - Valid encounter within last 6 months    Recent Outpatient Visits           1 month ago Uncontrolled hypertension   [facility] Glenard Mire, MD   7 months ago Mild carotid artery disease   [facility] [place], Krichna, MD               amLODipine  (NORVASC ) 5 MG tablet [Pharmacy Med Name: amLODIPine  Besylate 5 MG Oral Tablet] 90 tablet 3    Sig: TAKE 1 TABLET BY MOUTH DAILY     Cardiovascular: Calcium  Channel Blockers 2 Passed - 10/14/2024 12:28 PM      Passed - Last BP in normal range    BP Readings from Last 1 Encounters:  08/19/24 136/74         Passed - Last Heart Rate in normal range    Pulse Readings from Last 1 Encounters:  08/19/24 64         Passed - Valid encounter within last 6 months    Recent Outpatient Visits           1 month ago Uncontrolled hypertension   [facility] Glenard Mire, MD   7 months ago Mild carotid artery disease   [facility] Sowles, Krichna, MD

## 2024-11-04 ENCOUNTER — Ambulatory Visit (INDEPENDENT_AMBULATORY_CARE_PROVIDER_SITE_OTHER)

## 2024-11-04 VITALS — BP 156/80 | Ht 65.5 in | Wt 197.6 lb

## 2024-11-04 DIAGNOSIS — Z0000 Encounter for general adult medical examination without abnormal findings: Secondary | ICD-10-CM

## 2024-11-04 NOTE — Patient Instructions (Addendum)
 Ms. Penalver,  Thank you for taking the time for your Medicare Wellness Visit. I appreciate your continued commitment to your health goals. Please review the care plan we discussed, and feel free to reach out if I can assist you further.  Please note that Annual Wellness Visits do not include a physical exam. Some assessments may be limited, especially if the visit was conducted virtually. If needed, we may recommend an in-person follow-up with your provider.  Ongoing Care Seeing your primary care provider every 3 to 6 months helps us  monitor your health and provide consistent, personalized care. 12/19/24 @ 8:20 AM APPT W/ DR.SOWLES  Referrals If a referral was made during today's visit and you haven't received any updates within two weeks, please contact the referred provider directly to check on the status.  Recommended Screenings:  Health Maintenance  Topic Date Due   COVID-19 Vaccine (4 - 2025-26 season) 05/30/2024   Medicare Annual Wellness Visit  09/16/2024   Zoster (Shingles) Vaccine (1 of 2) 11/19/2024*   Osteoporosis screening with Bone Density Scan  06/03/2026   Colon Cancer Screening  06/05/2027   DTaP/Tdap/Td vaccine (3 - Tdap) 10/13/2034   Pneumococcal Vaccine for age over 84  Completed   Flu Shot  Completed   Hepatitis C Screening  Completed   Meningitis B Vaccine  Aged Out   Breast Cancer Screening  Discontinued  *Topic was postponed. The date shown is not the original due date.     Vision: Annual vision screenings are recommended for early detection of glaucoma, cataracts, and diabetic retinopathy. These exams can also reveal signs of chronic conditions such as diabetes and high blood pressure.  Dental: Annual dental screenings help detect early signs of oral cancer, gum disease, and other conditions linked to overall health, including heart disease and diabetes.  Please see the attached documents for additional preventive care recommendations.   NEXT AWV 11/09/25 @  10:50 AM IN PERSON

## 2024-11-04 NOTE — Progress Notes (Signed)
 "  Chief Complaint  Patient presents with   Medicare Wellness     Subjective:   [name] [name] is a 79 y.o. female who presents for a Medicare Annual Wellness Visit.  Visit info / Clinical Intake: Medicare Wellness Visit Type:: Subsequent Annual Wellness Visit Persons participating in visit and providing information:: patient Medicare Wellness Visit Mode:: In-person (required for WTM) Interpreter Needed?: No Pre-visit prep was completed: yes AWV questionnaire completed by patient prior to visit?: no Living arrangements:: lives with spouse/significant other Patient's Overall Health Status Rating: very good Typical amount of pain: none Does pain affect daily life?: no Are you currently prescribed opioids?: no  Dietary Habits and Nutritional Risks How many meals a day?: 2 (SNACKS) Eats fruit and vegetables daily?: yes Most meals are obtained by: preparing own meals In the last 2 weeks, have you had any of the following?: none Diabetic:: no  Functional Status Activities of Daily Living (to include ambulation/medication): Independent Ambulation: Independent Medication Administration: Independent Home Management (perform basic housework or laundry): Independent Manage your own finances?: yes Primary transportation is: driving Concerns about vision?: no *vision screening is required for WTM* (READERS- PATTY VISION-YEARLY) Concerns about hearing?: no  Fall Screening Falls in the past year?: 0 Number of falls in past year: 0 Was there an injury with Fall?: 0 Fall Risk Category Calculator: 0 Patient Fall Risk Level: Low Fall Risk  Fall Risk Patient at Risk for Falls Due to: No Fall Risks Fall risk Follow up: Falls evaluation completed; Falls prevention discussed  Home and Transportation Safety: All rugs have non-skid backing?: yes All stairs or steps have railings?: N/A, no stairs Grab bars in the bathtub or shower?: (!) no Have non-skid surface in bathtub or shower?:  yes Good home lighting?: yes Regular seat belt use?: yes Hospital stays in the last year:: no  Cognitive Assessment Difficulty concentrating, remembering, or making decisions? : no Will 6CIT or Mini Cog be Completed: yes What year is it?: 0 points What month is it?: 0 points Give patient an address phrase to remember (5 components): [address] [place], [place] About what time is it?: 0 points Count backwards from 20 to 1: 0 points Say the months of the year in reverse: 0 points Repeat the address phrase from earlier: 2 points 6 CIT Score: 2 points  Advance Directives (For Healthcare) Does Patient Have a Medical Advance Directive?: No Would patient like information on creating a medical advance directive?: No - Patient declined  Reviewed/Updated  Reviewed/Updated: Reviewed All (Medical, Surgical, Family, Medications, Allergies, Care Teams, Patient Goals)    Allergies (verified) Patient has no known allergies.   Current Medications (verified) Outpatient Encounter Medications as of 11/04/2024  Medication Sig   acetaminophen  (TYLENOL ) 500 MG tablet Take 2 tablets (1,000 mg total) by mouth every 6 (six) hours as needed for mild pain.   amLODipine  (NORVASC ) 5 MG tablet TAKE 1 TABLET BY MOUTH DAILY   aspirin EC 81 MG tablet Take by mouth. (Patient taking differently: Take by mouth. TAKES 3 TIMES PER WEEK)   Cholecalciferol (VITAMIN D3 PO) Take 5,000 Units by mouth in the morning. (Patient taking differently: Take 5,000 Units by mouth in the morning. TAKES 3 TIMES PER WEEK)   metoprolol  succinate (TOPROL -XL) 25 MG 24 hr tablet TAKE 1 TABLET BY MOUTH DAILY   Multiple Vitamin (MULTIVITAMIN WITH MINERALS) TABS tablet Take 1 tablet by mouth in the morning. Centrum for Women   rosuvastatin  (CRESTOR ) 40 MG tablet Take 1 tablet (  40 mg total) by mouth daily.   zinc gluconate 50 MG tablet Take 50 mg by mouth daily.   nystatin  ointment (MYCOSTATIN ) Apply 1 Application topically 2 (two) times  daily. (Patient not taking: Reported on 11/04/2024)   No facility-administered encounter medications on file as of 11/04/2024.    History: Past Medical History:  Diagnosis Date   Chest pain, unspecified    Colitis 02/20/2016   H. pylori duodenitis    persistent fall 2023 treated by Dr. Dessa   H/O left breast biopsy 12/11/2005   [facility], path report matched the clinical impression of left breast lipoma   HLD (hyperlipidemia)    mixed   HTN (hypertension)    Kidney stones 03/2022   Past Surgical History:  Procedure Laterality Date   ABDOMINAL HYSTERECTOMY  2007   total   BIOPSY  08/07/2023   Procedure: BIOPSY;  Surgeon: Maryruth Ole DASEN, MD;  Location: [facility];  Service: Endoscopy;;   BREAST BIOPSY Left 2007   Dr. Fredirick office-benign   COLONOSCOPY  11/12/2001   multiple sessile polyps found in rectum, 5mm in size, distributed diffusely, biopsies were taken. The polyps extended from lower rectum to approximately 20 cm. They were confluent in the area between 10-15cm. Small polyps with central ulceration were noted.   COLONOSCOPY WITH PROPOFOL  N/A 08/04/2018   Procedure: COLONOSCOPY WITH PROPOFOL ;  Surgeon: Dessa Reyes ORN, MD;  Location: [facility];  Service: Endoscopy;  Laterality: N/A;   COLONOSCOPY WITH PROPOFOL  N/A 06/04/2022   Procedure: COLONOSCOPY WITH PROPOFOL ;  Surgeon: Dessa Reyes ORN, MD;  Location: [facility];  Service: Endoscopy;  Laterality: N/A;   ESOPHAGOGASTRODUODENOSCOPY (EGD) WITH PROPOFOL  N/A 06/04/2022   Procedure: ESOPHAGOGASTRODUODENOSCOPY (EGD) WITH PROPOFOL ;  Surgeon: Dessa Reyes ORN, MD;  Location: [facility];  Service: Endoscopy;  Laterality: N/A;  with polypectomy   ESOPHAGOGASTRODUODENOSCOPY (EGD) WITH PROPOFOL  N/A 07/30/2022   Procedure: ESOPHAGOGASTRODUODENOSCOPY (EGD) WITH PROPOFOL ;  Surgeon: Dessa Reyes ORN, MD;  Location: [facility];  Service: Endoscopy;  Laterality: N/A;   ESOPHAGOGASTRODUODENOSCOPY (EGD) WITH  PROPOFOL  N/A 08/07/2023   Procedure: ESOPHAGOGASTRODUODENOSCOPY (EGD) WITH PROPOFOL ;  Surgeon: Maryruth Ole DASEN, MD;  Location: [facility];  Service: Endoscopy;  Laterality: N/A;   HERNIA REPAIR     TUBAL LIGATION     UMBILICAL HERNIA REPAIR N/A 10/01/2021   Procedure: HERNIA REPAIR UMBILICAL ADULT, open;  Surgeon: Desiderio Schanz, MD;  Location: [facility];  Service: General;  Laterality: N/A;   Family History  Problem Relation Age of Onset   Hypertension Mother    Hyperlipidemia Mother    Other Mother        enlarged heart   Heart disease Father 66   Diabetes Father    Other Sister    Kidney failure Sister    Heart disease Sister    Hypertension Sister    Diabetes Sister    Heart disease Brother    Hypertension Brother    Kidney failure Brother    Hypertension Brother    Kidney failure Brother    Diabetes Brother    Cancer Brother    Colon cancer Brother    Diabetes Brother    Coronary artery disease Other        family hx   Hyperlipidemia Son    Hypertension Son    Hypertension Son    Hyperlipidemia Son    Breast cancer Neg Hx    Social History   Occupational History   Occupation: retired  Tobacco Use   Smoking status: Never  Smokeless tobacco: Never   Tobacco comments:    tobacco use - no  Vaping Use   Vaping status: Never Used  Substance and Sexual Activity   Alcohol use: Never   Drug use: No   Sexual activity: Not Currently   Tobacco Counseling Counseling given: Not Answered Tobacco comments: tobacco use - no  SDOH Screenings   Food Insecurity: No Food Insecurity (11/04/2024)  Housing: Low Risk (11/04/2024)  Transportation Needs: No Transportation Needs (11/04/2024)  Utilities: Not At Risk (11/04/2024)  Alcohol Screen: Low Risk (09/17/2023)  Depression (PHQ2-9): Low Risk (11/04/2024)  Financial Resource Strain: Low Risk (09/17/2023)  Physical Activity: Insufficiently Active (11/04/2024)  Social Connections: Socially Integrated (11/04/2024)  Stress: No  Stress Concern Present (11/04/2024)  Tobacco Use: Low Risk (11/04/2024)  Health Literacy: Adequate Health Literacy (11/04/2024)   See flowsheets for full screening details  Depression Screen PHQ 2 & 9 Depression Scale- Over the past 2 weeks, how often have you been bothered by any of the following problems? Little interest or pleasure in doing things: 0 Feeling down, depressed, or hopeless (PHQ Adolescent also includes...irritable): 0 PHQ-2 Total Score: 0 Trouble falling or staying asleep, or sleeping too much: 0 Feeling tired or having little energy: 0 Poor appetite or overeating (PHQ Adolescent also includes...weight loss): 0 Feeling bad about yourself - or that you are a failure or have let yourself or your family down: 0 Trouble concentrating on things, such as reading the newspaper or watching television (PHQ Adolescent also includes...like school work): 0 Moving or speaking so slowly that other people could have noticed. Or the opposite - being so fidgety or restless that you have been moving around a lot more than usual: 0 Thoughts that you would be better off dead, or of hurting yourself in some way: 0 PHQ-9 Total Score: 0 If you checked off any problems, how difficult have these problems made it for you to do your work, take care of things at home, or get along with other people?: Not difficult at all  Depression Treatment Depression Interventions/Treatment : [id number] Score <4 Follow-up Not Indicated     Goals Addressed             This Visit's Progress    DIET - INCREASE WATER INTAKE               Objective:    Today's Vitals   11/04/24 0840  BP: (!) 156/80  Weight: 197 lb 9.6 oz (89.6 kg)  Height: 5' 5.5 (1.664 m)   Body mass index is 32.38 kg/m.  Hearing/Vision screen No results found. Immunizations and Health Maintenance Health Maintenance  Topic Date Due   COVID-19 Vaccine (4 - 2025-26 season) 05/30/2024   Zoster Vaccines- Shingrix (1 of 2) 11/19/2024  (Originally 04/09/1965)   Medicare Annual Wellness (AWV)  11/04/2025   Bone Density Scan  06/03/2026   Colonoscopy  06/05/2027   DTaP/Tdap/Td (3 - Tdap) 10/13/2034   Pneumococcal Vaccine: 50+ Years  Completed   Influenza Vaccine  Completed   Hepatitis C Screening  Completed   Meningococcal B Vaccine  Aged Out   Mammogram  Discontinued        Assessment/Plan:  This is a routine wellness examination for [place].  Patient Care Team: Sowles, Krichna, MD as PCP - General (Family Medicine) Pa, [facility] as Consulting Physician Byrnett, Reyes ORN, MD as Consulting Physician (General Surgery)  I have personally reviewed and noted the following in the patients chart:   Medical  and social history Use of alcohol, tobacco or illicit drugs  Current medications and supplements including opioid prescriptions. Functional ability and status Nutritional status Physical activity Advanced directives List of other physicians Hospitalizations, surgeries, and ER visits in previous 12 months Vitals Screenings to include cognitive, depression, and falls Referrals and appointments  No orders of the defined types were placed in this encounter.  In addition, I have reviewed and discussed with patient certain preventive protocols, quality metrics, and best practice recommendations. A written personalized care plan for preventive services as well as general preventive health recommendations were provided to patient.   Jhonnie GORMAN Das, LPN   03/31/7972   Return in about 1 year (around 11/04/2025).  After Visit Summary: (In Person-Printed) AVS printed and given to the patient  Nurse Notes: UTD ON SHOTS EXCEPT TDAP; DOESN'T WANT SHINGRIX; AGED OUT OF MAMMOGRAM & COLONOSCOPY; UTD ON BDS  No voiced or noted concerns at this time  "

## 2024-12-19 ENCOUNTER — Ambulatory Visit: Admitting: Family Medicine

## 2025-11-09 ENCOUNTER — Ambulatory Visit
